# Patient Record
Sex: Female | Born: 1990
Health system: Southern US, Community
[De-identification: ages and names within clinical notes are randomized; demographics above are authoritative.]

## PROBLEM LIST (undated history)

## (undated) DIAGNOSIS — M549 Dorsalgia, unspecified: Secondary | ICD-10-CM

## (undated) DIAGNOSIS — F32A Depression, unspecified: Secondary | ICD-10-CM

## (undated) DIAGNOSIS — E282 Polycystic ovarian syndrome: Secondary | ICD-10-CM

## (undated) DIAGNOSIS — D649 Anemia, unspecified: Secondary | ICD-10-CM

## (undated) DIAGNOSIS — F419 Anxiety disorder, unspecified: Secondary | ICD-10-CM

## (undated) DIAGNOSIS — G8929 Other chronic pain: Secondary | ICD-10-CM

## (undated) HISTORY — DX: Depression, unspecified: F32.A

## (undated) HISTORY — DX: Polycystic ovarian syndrome: E28.2

## (undated) HISTORY — DX: Anxiety disorder, unspecified: F41.9

## (undated) HISTORY — DX: Other chronic pain: G89.29

## (undated) HISTORY — DX: Other chronic pain: M54.9

## (undated) HISTORY — PX: COLPOSCOPY: SHX161

## (undated) HISTORY — PX: WISDOM TOOTH EXTRACTION: SHX21

---

## 2013-03-11 ENCOUNTER — Emergency Department (HOSPITAL_COMMUNITY)
Admission: EM | Admit: 2013-03-11 | Discharge: 2013-03-11 | Disposition: A | Discharge status: Home / Self Care | Payer: 59 | Source: Home / Self Care | Attending: Family Medicine | Admitting: Family Medicine

## 2013-03-11 ENCOUNTER — Encounter (HOSPITAL_COMMUNITY): Payer: Self-pay | Admitting: Emergency Medicine

## 2013-03-11 DIAGNOSIS — A088 Other specified intestinal infections: Secondary | ICD-10-CM

## 2013-03-11 DIAGNOSIS — A084 Viral intestinal infection, unspecified: Secondary | ICD-10-CM

## 2013-03-11 LAB — POCT URINALYSIS DIP (DEVICE)
Glucose, UA: NEGATIVE mg/dL
Hgb urine dipstick: NEGATIVE
Ketones, ur: NEGATIVE mg/dL
Nitrite: NEGATIVE
Protein, ur: NEGATIVE mg/dL
Specific Gravity, Urine: 1.025 (ref 1.005–1.030)
Urobilinogen, UA: 0.2 mg/dL (ref 0.0–1.0)
pH: 6 (ref 5.0–8.0)

## 2013-03-11 LAB — POCT PREGNANCY, URINE: Preg Test, Ur: NEGATIVE

## 2013-03-11 MED ORDER — ONDANSETRON HCL 4 MG PO TABS: 4.0000 mg | ORAL_TABLET | Freq: Four times a day (QID) | ORAL | Status: DC

## 2013-03-11 NOTE — ED Notes (Signed)
C/o dizziness,nausea,fatigue, stomach pain with diarrhea. Pt states she has experienced the same symptoms before and was told several years ago that she was borderline hypoglycemic but never followed up.   Denies any other symptoms.  No otc meds taken for symptoms.

## 2013-03-11 NOTE — ED Provider Notes (Signed)
Nancy Hunter is a 22 y.o. female who presents to Urgent Care today for fatigue and lightheadedness associated with nausea and vomiting. Patient had symptoms starting about 3 or 4 days ago. She had initial vomiting and loose stools. She denies any blood in her stool or vomit. The vomiting and diarrhea have resolved. However she continues to feel tired and fatigued and some dizziness. She denies any vertigo or syncope chest pains or palpitations. She has not tried any medications yet. She is able to eat and drink normally and is urinating normally. She denies any dysuria urinary frequency or urgency. Her last menstrual period was in early October.   She has had similar symptoms in the past. She's been told that she's had low blood sugar and anxiety however she does not have a formal diagnosis.   History reviewed. No pertinent past medical history. History  Substance Use Topics  . Smoking status: Never Smoker   . Smokeless tobacco: Not on file  . Alcohol Use: Yes   ROS as above Medications reviewed. No current facility-administered medications for this encounter.   Current Outpatient Prescriptions  Medication Sig Dispense Refill  . ondansetron (ZOFRAN) 4 MG tablet Take 1 tablet (4 mg total) by mouth every 6 (six) hours.  12 tablet  0    Exam:  BP 130/81  Pulse 89  Temp(Src) 98.3 F (36.8 C) (Oral)  Resp 16  SpO2 100%  LMP 02/13/2013 Filed Vitals:   03/11/13 1444 03/11/13 1445 03/11/13 1446  BP: 126/68 131/71 130/81  Pulse: 76 86 89  Temp: 98.3 F (36.8 C)    TempSrc: Oral    Resp: 16    SpO2: 100%     Gen: Well NAD HEENT: EOMI,  MMM Lungs: CTABL Nl WOB Heart: RRR no MRG Abd: NABS, NT, ND, no CV angle tenderness to percussion. No rebound or guarding. Exts: Non edematous BL  LE, warm and well perfused.   Results for orders placed during the hospital encounter of 03/11/13 (from the past 24 hour(s))  POCT URINALYSIS DIP (DEVICE)     Status: None   Collection Time    03/11/13   2:54 PM      Result Value Range   Glucose, UA NEGATIVE  NEGATIVE mg/dL   Bilirubin Urine NEGATIVE  NEGATIVE   Ketones, ur NEGATIVE  NEGATIVE mg/dL   Specific Gravity, Urine 1.025  1.005 - 1.030   Hgb urine dipstick NEGATIVE  NEGATIVE   pH 6.0  5.0 - 8.0   Protein, ur NEGATIVE  NEGATIVE mg/dL   Urobilinogen, UA 0.2  0.0 - 1.0 mg/dL   Nitrite NEGATIVE  NEGATIVE   Leukocytes, UA NEGATIVE  NEGATIVE  GLUCOSE, CAPILLARY     Status: None   Collection Time    03/11/13  2:58 PM      Result Value Range   Glucose-Capillary 82  70 - 99 mg/dL  POCT PREGNANCY, URINE     Status: None   Collection Time    03/11/13  2:59 PM      Result Value Range   Preg Test, Ur NEGATIVE  NEGATIVE   No results found.  Assessment and Plan: 22 y.o. female with resolving viral gastroenteritis. Patient is not orthostatic and has a benign physical exam and lab findings today. Plan to treat with watchful waiting, and Tylenol as well as a normal diet. I have prescribed Zofran in case she resumes vomiting however think that's unlikely. Plan to followup with her primary care provider soon Discussed warning  signs or symptoms. Please see discharge instructions. Patient expresses understanding.      Rodolph Bong, MD 03/11/13 [phone]

## 2013-11-03 ENCOUNTER — Emergency Department (HOSPITAL_COMMUNITY)
Admission: EM | Admit: 2013-11-03 | Discharge: 2013-11-03 | Disposition: A | Discharge status: Home / Self Care | Payer: 59 | Source: Home / Self Care

## 2013-11-03 ENCOUNTER — Encounter (HOSPITAL_COMMUNITY): Payer: Self-pay | Admitting: Emergency Medicine

## 2013-11-03 DIAGNOSIS — R109 Unspecified abdominal pain: Secondary | ICD-10-CM

## 2013-11-03 DIAGNOSIS — N926 Irregular menstruation, unspecified: Secondary | ICD-10-CM

## 2013-11-03 LAB — POCT URINALYSIS DIP (DEVICE)
BILIRUBIN URINE: NEGATIVE
Glucose, UA: NEGATIVE mg/dL
Hgb urine dipstick: NEGATIVE
KETONES UR: NEGATIVE mg/dL
LEUKOCYTES UA: NEGATIVE
Nitrite: NEGATIVE
Protein, ur: NEGATIVE mg/dL
Specific Gravity, Urine: 1.015 (ref 1.005–1.030)
Urobilinogen, UA: 0.2 mg/dL (ref 0.0–1.0)
pH: 7.5 (ref 5.0–8.0)

## 2013-11-03 NOTE — ED Notes (Signed)
Pt reports abn vaginal spotting onset Monday Sx also include intermittent abd pain, n/v/d Denies fevers Alert w/no signs of acute distress.

## 2013-11-03 NOTE — ED Provider Notes (Signed)
CSN: [id number]     Arrival date & time 11/03/13  1556 History   None    Chief Complaint  Patient presents with  . Vaginal Bleeding   (Consider location/radiation/quality/duration/timing/severity/associated sxs/prior Treatment) HPI  Vaginal bleeding: LMP was May 5th. Typically irregular up to January when put on Provera. No longer on provera (last time was in march along w/ clomed). Period was supposed to start on Sunday but pt only had a few drops. Associated w/ cramps. Increase nausea during this time. Pt scheduled to take provera again per OB 2 days ago. Sexually active and not protecting. Denies fevers, vaginal discharge.   History reviewed. No pertinent past medical history. History reviewed. No pertinent past surgical history. No family history on file. History  Substance Use Topics  . Smoking status: Never Smoker   . Smokeless tobacco: Not on file  . Alcohol Use: Yes   OB History   Grav Para Term Preterm Abortions TAB SAB Ect Mult Living                 Review of Systems Per HPI w./ all other systems negative Allergies  Review of patient's allergies indicates no known allergies.  Home Medications   Prior to Admission medications   Medication Sig Start Date End Date Taking? Authorizing Laakea Pereira  ondansetron (ZOFRAN) 4 MG tablet Take 1 tablet (4 mg total) by mouth every 6 (six) hours. 03/11/13   Rodolph BongEvan S Corey, MD   BP 125/53  Pulse 67  Temp(Src) 97.8 F (36.6 C) (Oral)  Resp 16  SpO2 100%  LMP 09/30/2013 Physical Exam  Constitutional: She is oriented to person, place, and time. She appears well-developed. No distress.  HENT:  Head: Normocephalic and atraumatic.  Eyes: EOM are normal. Pupils are equal, round, and reactive to light.  Neck: Normal range of motion. Neck supple.  Cardiovascular: Normal rate, normal heart sounds and intact distal pulses.   No murmur heard. Pulmonary/Chest: Effort normal and breath sounds normal.  Abdominal: Soft. Bowel sounds are  normal. She exhibits no distension and no mass. There is no tenderness. There is no rebound and no guarding.  Musculoskeletal: Normal range of motion. She exhibits no edema and no tenderness.  Neurological: She is alert and oriented to person, place, and time. She exhibits normal muscle tone.  Skin: Skin is warm and dry. No rash noted. She is not diaphoretic. No erythema. No pallor.  Psychiatric: She has a normal mood and affect. Her behavior is normal. Judgment and thought content normal.    ED Course  Procedures (including critical care time) Labs Review Labs Reviewed - No data to display  Imaging Review No results found.   MDM   1. Abdominal discomfort   2. Menstrual irregularity    Pt actively trying to get pregnant and is apparently undergoing a detailed workkup at her OB office. Urine Preg is neg today. Pt may still be neg but will f/u w/ OB office for possible bHCG Quant level. Pt to not take provera until repeats Urine preg in 2 wks if desired. No signs of vaginal or urinary tract infection  ibuprofen PRN releif.  Shelly Flattenavid Merrell, MD Family Medicine PGY-3 11/03/2013, 6:19 PM     Ozella Rocksavid J Merrell, MD 11/03/13 [phone]

## 2013-11-03 NOTE — Discharge Instructions (Signed)
You are not pregnant today, or at least your lab work does not show you are pregnant.  Please give yourself another 2 weeks and retake a pregnancy test Please call your OB office if you want to get blood work to look for possible pregnancy There doesn't seem to be anything else wrong with your body at this time other than a possible mild viral gut infection Wait to restart your provera until you take another pregnancy test in 2 weeks

## 2013-11-07 NOTE — ED Provider Notes (Signed)
Medical screening examination/treatment/procedure(s) were performed by a resident physician and as supervising physician I was immediately available for consultation/collaboration.  Leslee Homeavid Keller, M.D.   Reuben Likesavid C Keller, MD 11/07/13 1155

## 2013-11-08 ENCOUNTER — Ambulatory Visit (INDEPENDENT_AMBULATORY_CARE_PROVIDER_SITE_OTHER): Payer: 59 | Admitting: Physician Assistant

## 2013-11-08 VITALS — BP 114/60 | HR 77 | Temp 98.0°F | Resp 16 | Ht 64.0 in | Wt 119.0 lb

## 2013-11-08 DIAGNOSIS — J302 Other seasonal allergic rhinitis: Secondary | ICD-10-CM

## 2013-11-08 DIAGNOSIS — J3089 Other allergic rhinitis: Secondary | ICD-10-CM

## 2013-11-08 DIAGNOSIS — R05 Cough: Secondary | ICD-10-CM

## 2013-11-08 DIAGNOSIS — R059 Cough, unspecified: Secondary | ICD-10-CM

## 2013-11-08 NOTE — Patient Instructions (Signed)
Recommend Zyrtec (cetirizine) daily in the morning. May also take Benadryl 25-50 mg at bedtime

## 2013-11-08 NOTE — Progress Notes (Signed)
   Subjective:    Patient ID: Nancy Hunter, female    DOB: 1991/02/06, 23 y.o.   MRN: [id number]  HPI 23 year old female presents for return to work clearance.  States she has had a cough for 1 week that is now improving. Was [facility] on 6/18 due to viral gastroenteritis as well as her "summer cold."  Admits that both have improved. She had a fever on 6/19 and 6/20 that has now resolved. Admits she still has a dry, hacking cough along with sneezing and itchy watery eyes. Does have a hx of allergies.  Takes Benadryl prn which does seem to help.  Works at [employer].  Not currently on any OTC medications for this.     Review of Systems  Constitutional: Negative for fever and chills.  HENT: Positive for rhinorrhea and sneezing. Negative for congestion, ear pain, sinus pressure and sore throat.   Respiratory: Positive for cough. Negative for chest tightness, shortness of breath and wheezing.   Gastrointestinal: Negative for nausea and vomiting.  Neurological: Negative for dizziness and headaches.       Objective:   Physical Exam  Constitutional: She is oriented to person, place, and time. She appears well-developed and well-nourished.  HENT:  Head: Normocephalic and atraumatic.  Right Ear: Hearing, tympanic membrane, external ear and ear canal normal.  Left Ear: Hearing, tympanic membrane, external ear and ear canal normal.  Mouth/Throat: Uvula is midline, oropharynx is clear and moist and mucous membranes are normal.  Eyes: Conjunctivae are normal.  Neck: Normal range of motion.  Cardiovascular: Normal rate, regular rhythm and normal heart sounds.   Pulmonary/Chest: Effort normal and breath sounds normal.  Neurological: She is alert and oriented to person, place, and time.  Psychiatric: She has a normal mood and affect. Her behavior is normal. Judgment and thought content normal.          Assessment & Plan:  Cough  Other seasonal allergic rhinitis  Cleared  to return to work.  GI illness resolved. Cough is improving - likely secondary to allergic rhinitis and PND Start Zyrtec daily in the a.m. Ok to continue Benadryl 25-50 mg at bedtime RTC precautions discussed. F/u if symptoms worsening or fail to improve.

## 2013-11-18 ENCOUNTER — Ambulatory Visit (INDEPENDENT_AMBULATORY_CARE_PROVIDER_SITE_OTHER): Payer: 59 | Admitting: Emergency Medicine

## 2013-11-18 VITALS — BP 126/76 | HR 106 | Temp 98.4°F | Resp 18 | Ht 64.5 in | Wt 120.0 lb

## 2013-11-18 DIAGNOSIS — A088 Other specified intestinal infections: Secondary | ICD-10-CM

## 2013-11-18 MED ORDER — LOPERAMIDE HCL 2 MG PO TABS: ORAL_TABLET | ORAL | Status: DC

## 2013-11-18 MED ORDER — ONDANSETRON 4 MG PO TBDP
8.0000 mg | ORAL_TABLET | Freq: Once | ORAL | Status: AC
  Administered 2013-11-18: 8 mg via ORAL

## 2013-11-18 MED ORDER — ONDANSETRON 8 MG PO TBDP: 8.0000 mg | ORAL_TABLET | Freq: Three times a day (TID) | ORAL | Status: DC | PRN

## 2013-11-18 NOTE — Progress Notes (Signed)
[facility] [address], [place] [address] [phone]  Date:  11/18/2013   Name:  Nancy Hunter   DOB:  10-11-1990   MRN:  [id number]  PCP:  No PCP Per Patient    Chief Complaint: Nausea, Emesis and Diarrhea   History of Present Illness:  Nancy FlorSymone Spilker is a 23 y.o. very pleasant female patient who presents with the following:  Ill yesterday and today with frequent watery stools.  The patient has no complaint of blood, mucous, or pus in her stools. Worse today.  Vomiting.  Poor appetite.  No travel or sketchy water.  No fever or chills.  No sick contacts.  Some abdominal cramping.  No improvement with over the counter medications or other home remedies. Denies other complaint or health concern today.    There are no active problems to display for this patient.   No past medical history on file.  No past surgical history on file.  History  Substance Use Topics  . Smoking status: Never Smoker   . Smokeless tobacco: Not on file  . Alcohol Use: Yes    No family history on file.  No Known Allergies  Medication list has been reviewed and updated.  No current outpatient prescriptions on file prior to visit.   No current facility-administered medications on file prior to visit.    Review of Systems:  As per HPI, otherwise negative.    Physical Examination: Filed Vitals:   11/18/13 1751  BP: 126/76  Pulse: 106  Temp: 98.4 F (36.9 C)  Resp: 18   Filed Vitals:   11/18/13 1751  Height: 5' 4.5" (1.638 m)  Weight: 120 lb (54.432 kg)   Body mass index is 20.29 kg/(m^2). Ideal Body Weight: Weight in (lb) to have BMI = 25: 147.6  GEN: WDWN, NAD, Non-toxic, A & O x 3  Dry.  Not icteric HEENT: Atraumatic, Normocephalic. Neck supple. No masses, No LAD. Ears and Nose: No external deformity. CV: RRR, No M/G/R. No JVD. No thrill. No extra heart sounds. PULM: CTA B, no wheezes, crackles, rhonchi. No retractions. No resp. distress. No accessory  muscle use. ABD: S, NT, ND,  Hyperactive BS. No rebound. No HSM. EXTR: No c/c/e NEURO Normal gait.  PSYCH: Normally interactive. Conversant. Not depressed or anxious appearing.  Calm demeanor.    Assessment and Plan: Gastroenteritis Imodium zofran Clears   Signed,  Phillips OdorJeffery Delrico Minehart, MD

## 2013-11-18 NOTE — Patient Instructions (Signed)
Clear Liquid Diet A clear liquid diet is a short-term diet that is prescribed to provide the necessary fluid and basic energy you need when you can have nothing else. The clear liquid diet consists of liquids or solids that will become liquid at room temperature. You should be able to see through the liquid. There are many reasons that you may be restricted to clear liquids, such as:  When you have a sudden-onset (acute) condition that occurs before or after surgery.  To help your body slowly get adjusted to food again after a long period when you were unable to have food.  Replacement of fluids when you have a diarrheal disease.  When you are going to have certain exams, such as a colonoscopy, in which instruments are inserted inside your body to look at parts of your digestive system. WHAT CAN I HAVE? A clear liquid diet does not provide all the nutrients you need. It is important to choose a variety of the following items to get as many nutrients as possible:  Vegetable juices that do not have pulp.  Fruit juices and fruit drinks that do not have pulp.  Coffee (regular or decaffeinated), tea, or soda at the discretion of your health care provider.  Clear bouillon, broth, or strained broth-based soups.  High-protein and flavored gelatins.  Sugar or honey.  Ices or frozen ice pops that do not contain milk. If you are not sure whether you can have certain items, you should ask your health care provider. You may also ask your health care provider if there are any other clear liquid options. Document Released: 05/05/2005 Document Revised: 05/10/2013 Document Reviewed: 04/01/2013 ExitCare Patient Information 2015 [employer]. This information is not intended to replace advice given to you by your health care provider. Make sure you discuss any questions you have with your health care provider. Viral Gastroenteritis Viral gastroenteritis is also known as stomach flu. This condition  affects the stomach and intestinal tract. It can cause sudden diarrhea and vomiting. The illness typically lasts 3 to 8 days. Most people develop an immune response that eventually gets rid of the virus. While this natural response develops, the virus can make you quite ill. CAUSES  Many different viruses can cause gastroenteritis, such as rotavirus or noroviruses. You can catch one of these viruses by consuming contaminated food or water. You may also catch a virus by sharing utensils or other personal items with an infected person or by touching a contaminated surface. SYMPTOMS  The most common symptoms are diarrhea and vomiting. These problems can cause a severe loss of body fluids (dehydration) and a body salt (electrolyte) imbalance. Other symptoms may include:  Fever.  Headache.  Fatigue.  Abdominal pain. DIAGNOSIS  Your caregiver can usually diagnose viral gastroenteritis based on your symptoms and a physical exam. A stool sample may also be taken to test for the presence of viruses or other infections. TREATMENT  This illness typically goes away on its own. Treatments are aimed at rehydration. The most serious cases of viral gastroenteritis involve vomiting so severely that you are not able to keep fluids down. In these cases, fluids must be given through an intravenous line (IV). HOME CARE INSTRUCTIONS   Drink enough fluids to keep your urine clear or pale yellow. Drink small amounts of fluids frequently and increase the amounts as tolerated.  Ask your caregiver for specific rehydration instructions.  Avoid:  Foods high in sugar.  Alcohol.  Carbonated drinks.  Tobacco.  Juice.    Caffeine drinks.  Extremely hot or cold fluids.  Fatty, greasy foods.  Too much intake of anything at one time.  Dairy products until 24 to 48 hours after diarrhea stops.  You may consume probiotics. Probiotics are active cultures of beneficial bacteria. They may lessen the amount and  number of diarrheal stools in adults. Probiotics can be found in yogurt with active cultures and in supplements.  Wash your hands well to avoid spreading the virus.  Only take over-the-counter or prescription medicines for pain, discomfort, or fever as directed by your caregiver. Do not give aspirin to children. Antidiarrheal medicines are not recommended.  Ask your caregiver if you should continue to take your regular prescribed and over-the-counter medicines.  Keep all follow-up appointments as directed by your caregiver. SEEK IMMEDIATE MEDICAL CARE IF:   You are unable to keep fluids down.  You do not urinate at least once every 6 to 8 hours.  You develop shortness of breath.  You notice blood in your stool or vomit. This may look like coffee grounds.  You have abdominal pain that increases or is concentrated in one small area (localized).  You have persistent vomiting or diarrhea.  You have a fever.  The patient is a child younger than 3 months, and he or she has a fever.  The patient is a child older than 3 months, and he or she has a fever and persistent symptoms.  The patient is a child older than 3 months, and he or she has a fever and symptoms suddenly get worse.  The patient is a baby, and he or she has no tears when crying. MAKE SURE YOU:   Understand these instructions.  Will watch your condition.  Will get help right away if you are not doing well or get worse. Document Released: 05/05/2005 Document Revised: 07/28/2011 Document Reviewed: 02/19/2011 ExitCare Patient Information 2015 [employer]. This information is not intended to replace advice given to you by your health care provider. Make sure you discuss any questions you have with your health care provider.  

## 2014-01-23 ENCOUNTER — Encounter (HOSPITAL_COMMUNITY): Payer: Self-pay | Admitting: Emergency Medicine

## 2014-01-23 ENCOUNTER — Emergency Department (HOSPITAL_COMMUNITY)
Admission: EM | Admit: 2014-01-23 | Discharge: 2014-01-23 | Disposition: A | Discharge status: Home / Self Care | Payer: 59 | Attending: Emergency Medicine | Admitting: Emergency Medicine

## 2014-01-23 DIAGNOSIS — W010XXA Fall on same level from slipping, tripping and stumbling without subsequent striking against object, initial encounter: Secondary | ICD-10-CM | POA: Diagnosis not present

## 2014-01-23 DIAGNOSIS — S0180XA Unspecified open wound of other part of head, initial encounter: Secondary | ICD-10-CM | POA: Diagnosis present

## 2014-01-23 DIAGNOSIS — S0181XA Laceration without foreign body of other part of head, initial encounter: Secondary | ICD-10-CM

## 2014-01-23 DIAGNOSIS — S01409A Unspecified open wound of unspecified cheek and temporomandibular area, initial encounter: Secondary | ICD-10-CM | POA: Diagnosis not present

## 2014-01-23 DIAGNOSIS — W1809XA Striking against other object with subsequent fall, initial encounter: Secondary | ICD-10-CM | POA: Insufficient documentation

## 2014-01-23 DIAGNOSIS — Y9389 Activity, other specified: Secondary | ICD-10-CM | POA: Insufficient documentation

## 2014-01-23 DIAGNOSIS — Z23 Encounter for immunization: Secondary | ICD-10-CM | POA: Diagnosis not present

## 2014-01-23 DIAGNOSIS — Y9289 Other specified places as the place of occurrence of the external cause: Secondary | ICD-10-CM | POA: Insufficient documentation

## 2014-01-23 LAB — I-STAT CHEM 8, ED
BUN: 12 mg/dL (ref 6–23)
CHLORIDE: 106 meq/L (ref 96–112)
Calcium, Ion: 1.15 mmol/L (ref 1.12–1.23)
Creatinine, Ser: 0.9 mg/dL (ref 0.50–1.10)
Glucose, Bld: 92 mg/dL (ref 70–99)
HCT: 46 % (ref 36.0–46.0)
Hemoglobin: 15.6 g/dL — ABNORMAL HIGH (ref 12.0–15.0)
Potassium: 4 mEq/L (ref 3.7–5.3)
Sodium: 140 mEq/L (ref 137–147)
TCO2: 26 mmol/L (ref 0–100)

## 2014-01-23 MED ORDER — OXYCODONE-ACETAMINOPHEN 5-325 MG PO TABS: 1.0000 | ORAL_TABLET | Freq: Four times a day (QID) | ORAL | Status: DC | PRN

## 2014-01-23 MED ORDER — LIDOCAINE-EPINEPHRINE 1 %-1:100000 IJ SOLN
10.0000 mL | Freq: Once | INTRAMUSCULAR | Status: AC
  Administered 2014-01-23: 10 mL
  Filled 2014-01-23: qty 1

## 2014-01-23 MED ORDER — OXYCODONE-ACETAMINOPHEN 5-325 MG PO TABS
1.0000 | ORAL_TABLET | Freq: Once | ORAL | Status: AC
  Administered 2014-01-23: 1 via ORAL
  Filled 2014-01-23: qty 1

## 2014-01-23 MED ORDER — NAPROXEN 500 MG PO TABS: 500.0000 mg | ORAL_TABLET | Freq: Two times a day (BID) | ORAL | Status: DC | PRN

## 2014-01-23 MED ORDER — TETANUS-DIPHTH-ACELL PERTUSSIS 5-2.5-18.5 LF-MCG/0.5 IM SUSP
0.5000 mL | Freq: Once | INTRAMUSCULAR | Status: AC
  Administered 2014-01-23: 0.5 mL via INTRAMUSCULAR
  Filled 2014-01-23: qty 0.5

## 2014-01-23 NOTE — ED Notes (Signed)
Per pt sts that she slipped and fell in the bathroom. sts hit face on the edge of tub. Pt has small lac to left cheek . Bleeding controlled.

## 2014-01-23 NOTE — ED Provider Notes (Signed)
CSN: [id number]     Arrival date & time 01/23/14  1344 History   First MD Initiated Contact with Patient 01/23/14 1602     This chart was scribed for non-physician practitioner, Allen Derry PA-C working with Gilda Crease, * by Arlan Organ, ED Scribe. This patient was seen in room TR08C/TR08C and the patient's care was started at 4:09 PM.   Chief Complaint  Patient presents with  . Laceration   Patient is a 23 y.o. female presenting with skin laceration. The history is provided by the patient. No language interpreter was used.  Laceration Location:  Face Facial laceration location:  L cheek Quality: straight   Bleeding: controlled   Time since incident:  5 hours Laceration mechanism:  Fall Pain details:    Severity:  Mild   Timing:  Constant   Progression:  Improving Foreign body present:  No foreign bodies Tetanus status:  Unknown   HPI Comments: Nancy Hunter is a 23 y.o. female who presents to the Emergency Department complaining of laceration to the L cheek sustained at 10 AM this morning. Pt states she slipped and fell in the bathroom hitting her cheek on the ledge of the bath tub. No LOC but admits to mild dizziness and lightheadedness after falling. Pt states wound was bleeding significantly initially, however, bleeding is now controlled in ED. Currently rates pain 5/10 and describes as achy/throbbing. No aggravation factors at this time. Ms. Dungan was given Oxycodone in triage with improvement for symptoms. She denies any blurred vision, drooling, trouble swallowing, loose teeth, chest pain, SOB, nausea, vomiting, tinnitus, or visual disturbances. She denies currently being on any blood thinners. Pt is unaware of Tetanus status. She has no pertinent past medical history. No other concerns this visit.   History reviewed. No pertinent past medical history. History reviewed. No pertinent past surgical history. History reviewed. No pertinent family  history. History  Substance Use Topics  . Smoking status: Never Smoker   . Smokeless tobacco: Not on file  . Alcohol Use: Yes   OB History   Grav Para Term Preterm Abortions TAB SAB Ect Mult Living                 Review of Systems  Constitutional: Negative for fever and chills.  HENT: Negative for tinnitus and trouble swallowing.   Eyes: Negative for visual disturbance.  Respiratory: Negative for shortness of breath.   Gastrointestinal: Negative for nausea, vomiting and abdominal pain.  Skin: Positive for wound.  A complete 10 system review of systems was obtained and all systems are negative except as noted in the HPI and PMH.     Allergies  Review of patient's allergies indicates no known allergies.  Home Medications   Prior to Admission medications   Medication Sig Start Date End Date Taking? Authorizing Provider  loperamide (IMODIUM A-D) 2 MG tablet 2 now and one hourly prn diarrhea.  Max 8 tabs in 24 hours 11/18/13   Carmelina Dane, MD  ondansetron (ZOFRAN-ODT) 8 MG disintegrating tablet Take 1 tablet (8 mg total) by mouth every 8 (eight) hours as needed for nausea. 11/18/13   Carmelina Dane, MD   Triage Vitals: BP 144/78  Pulse 72  Temp(Src) 98.2 F (36.8 C) (Oral)  Resp 16  Ht  (1.626 m)  Wt 125 lb (56.7 kg)  BMI 21.45 kg/m2  SpO2 100%   Physical Exam  Nursing note and vitals reviewed. Constitutional: She is oriented to person, place, and time.  Vital signs are normal. She appears well-developed and well-nourished. No distress.  HENT:  Head: Normocephalic. Head is with laceration.    Nose: Nose normal.  Mouth/Throat: Uvula is midline, oropharynx is clear and moist and mucous membranes are normal. Normal dentition.  Small 1cm straight superficial lac to L cheek. No jaw crepitus or TTP, no swelling, dentitia intact  Eyes: Conjunctivae and EOM are normal. Pupils are equal, round, and reactive to light.  Neck: Normal range of motion. Neck supple. No  spinous process tenderness and no muscular tenderness present. No rigidity. Normal range of motion present.  FROM intact without spinous process or paraspinous muscle TTP, no bony stepoffs or deformities, no muscle spasms. No rigidity or meningeal signs. No bruising or swelling.  Cardiovascular: Normal rate and intact distal pulses.   Pulmonary/Chest: Effort normal. No respiratory distress.  Abdominal: Normal appearance. She exhibits no distension.  Musculoskeletal: Normal range of motion.  MAE x4  Neurological: She is alert and oriented to person, place, and time. She has normal strength. No sensory deficit.  Skin: Skin is warm and dry. Laceration noted.  Lac as described above, no active bleeding  Psychiatric: She has a normal mood and affect.    ED Course  LACERATION REPAIR Date/Time: 01/23/2014 5:35 PM Performed by: Marjean Donna, Vinie Charity STRUPP Authorized by: Ramond Marrow Consent: Verbal consent obtained. Risks and benefits: risks, benefits and alternatives were discussed Consent given by: patient Patient understanding: patient states understanding of the procedure being performed Patient consent: the patient's understanding of the procedure matches consent given Patient identity confirmed: verbally with patient Body area: head/neck Location details: left cheek Laceration length: 1 cm Foreign bodies: no foreign bodies Tendon involvement: none Nerve involvement: none Vascular damage: no Anesthesia: local infiltration Local anesthetic: lidocaine 1% with epinephrine Anesthetic total: 0.5 ml Patient sedated: no Preparation: Patient was prepped and draped in the usual sterile fashion. Irrigation solution: saline Irrigation method: syringe Amount of cleaning: standard Debridement: none Degree of undermining: none Skin closure: glue Approximation: close Approximation difficulty: simple Dressing: band aid. Patient tolerance: Patient tolerated the procedure  well with no immediate complications.   (including critical care time)  DIAGNOSTIC STUDIES: Oxygen Saturation is 100% on RA, Normal by my interpretation.    COORDINATION OF CARE: 4:08 PM- Pt was offered X-Ray and declined. Will use Dermabond to repair wound. Advised pt to keep area covered for first few days and apply sunscreen to scar for the next year. Discussed treatment plan with pt at bedside and pt agreed to plan.      Labs Review Labs Reviewed - No data to display  Imaging Review No results found.   EKG Interpretation None      MDM   Final diagnoses:  Facial laceration, initial encounter    23y/o female with facial lac after falling in the bathtub. Tetanus shot was updated here. Percocet was given with relief of symptoms. Patient declined wanting x-rays, and I do not believe she has any maxillofacial fractures. G-tube amount of blood described, and lightheadedness, obtained a stat chem 8 with no anemia noted. Dermabond was placed with good hemostasis of the wound. Discussed care of Dermabond and scar for the next year. Given Naprosyn and Percocet for pain. Discussed following up at urgent care in 10 days. I explained the diagnosis and have given explicit precautions to return to the ER including for any other new or worsening symptoms. The patient understands and accepts the medical plan as it's been dictated and I have answered  their questions. Discharge instructions concerning home care and prescriptions have been given. The patient is STABLE and is discharged to home in good condition.   I personally performed the services described in this documentation, which was scribed in my presence. The recorded information has been reviewed and is accurate.   BP 144/78  Pulse 72  Temp(Src) 98.2 F (36.8 C) (Oral)  Resp 16  Ht  (1.626 m)  Wt 125 lb (56.7 kg)  BMI 21.45 kg/m2  SpO2 100%  Meds ordered this encounter  Medications  . oxyCODONE-acetaminophen  (PERCOCET/ROXICET) 5-325 MG per tablet 1 tablet    Sig:   . Tdap (BOOSTRIX) injection 0.5 mL    Sig:   . lidocaine-EPINEPHrine (XYLOCAINE W/EPI) 1 %-1:100000 (with pres) injection 10 mL    Sig:   . oxyCODONE-acetaminophen (PERCOCET) 5-325 MG per tablet    Sig: Take 1-2 tablets by mouth every 6 (six) hours as needed for severe pain.    Dispense:  6 tablet    Refill:  0    Order Specific Question:  Supervising Provider    Answer:  Eber Hong D [[id number]]  . naproxen (NAPROSYN) 500 MG tablet    Sig: Take 1 tablet (500 mg total) by mouth 2 (two) times daily as needed for mild pain, moderate pain or headache (TAKE WITH MEALS.).    Dispense:  20 tablet    Refill:  0    Order Specific Question:  Supervising Provider    Answer:  Eber Hong D [[id number]]     Paizleigh Wilds Strupp Camprubi-Soms, PA-C 01/23/14 1800
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
## 2014-01-23 NOTE — Discharge Instructions (Signed)
Keep wound and clean with mild soap and water. Keep area covered with a topical bandage, keep bandage dry, and do not submerge in water for 24 hours. Use ice for at a time every hour for additional pain relief and swelling. Alternate between naprosyn and percocet for additional pain relief. Follow up with your primary care doctor or the [facility] in approximately 7 days for wound recheck. Monitor area for signs of infection to include, but not limited to: increasing pain, redness, drainage/pus, or swelling. Return to emergency department for emergent changing or worsening symptoms. REMEMBER TO USE SUNSCREEN ON YOUR FACE FOR THE NEXT YEAR, BUT DON'T PUT ANYTHING ON THIS AREA FOR THE NEXT 10 DAYS.  Facial Laceration A facial laceration is a cut on the face. These injuries can be painful and cause bleeding. Some cuts may need to be closed with stitches (sutures), skin adhesive strips, or wound glue. Cuts usually heal quickly but can leave a scar. It can take 1-2 years for the scar to go away completely. HOME CARE   Only take medicines as told by your doctor.  Follow your doctor's instructions for wound care. For Stitches:  Keep the cut clean and dry.  If you have a bandage (dressing), change it at least once a day. Change the bandage if it gets wet or dirty, or as told by your doctor.  Wash the cut with soap and water 2 times a day. Rinse the cut with water. Pat it dry with a clean towel.  Put a thin layer of medicated cream on the cut as told by your doctor.  You may shower after the first 24 hours. Do not soak the cut in water until the stitches are removed.  Have your stitches removed as told by your doctor.  Do not wear any makeup until a few days after your stitches are removed. For Skin Adhesive Strips:  Keep the cut clean and dry.  Do not get the strips wet. You may take a bath, but be careful to keep the cut dry.  If the cut gets wet, pat it dry with a  clean towel.  The strips will fall off on their own. Do not remove the strips that are still stuck to the cut. For Wound Glue:  You may shower or take baths. Do not soak or scrub the cut. Do not swim. Avoid heavy sweating until the glue falls off on its own. After a shower or bath, pat the cut dry with a clean towel.  Do not put medicine or makeup on your cut until the glue falls off.  If you have a bandage, do not put tape over the glue.  Avoid lots of sunlight or tanning lamps until the glue falls off.  The glue will fall off on its own in 5-10 days. Do not pick at the glue. After Healing: Put sunscreen on the cut for the first year to reduce your scar. GET HELP RIGHT AWAY IF:   Your cut area gets red, painful, or puffy (swollen).  You see a yellowish-white fluid (pus) coming from the cut.  You have chills or a fever. MAKE SURE YOU:   Understand these instructions.  Will watch your condition.  Will get help right away if you are not doing well or get worse. Document Released: 10/22/2007 Document Revised: 02/23/2013 Document Reviewed: 12/16/2012 [facility] Patient Information 2015 [place], [place]. This information is not intended to replace advice given to you by your health  care provider. Make sure you discuss any questions you have with your health care provider.  Stitches, Staples, or Skin Adhesive Strips  Stitches (sutures), staples, and skin adhesive strips hold the skin together as it heals. They will usually be in place for 7 days or less. HOME CARE  Wash your hands with soap and water before and after you touch your wound.  Only take medicine as told by your doctor.  Cover your wound only if your doctor told you to. Otherwise, leave it open to air.  Do not get your stitches wet or dirty. If they get dirty, dab them gently with a clean washcloth. Wet the washcloth with soapy water. Do not rub. Pat them dry gently.  Do not put medicine or medicated cream on your  stitches unless your doctor told you to.  Do not take out your own stitches or staples. Skin adhesive strips will fall off by themselves.  Do not pick at the wound. Picking can cause an infection.  Do not miss your follow-up appointment.  If you have problems or questions, call your doctor. GET HELP RIGHT AWAY IF:   You have a temperature by mouth above 102 F (38.9 C), not controlled by medicine.  You have chills.  You have redness or pain around your stitches.  There is puffiness (swelling) around your stitches.  You notice fluid (drainage) from your stitches.  There is a bad smell coming from your wound. MAKE SURE YOU:  Understand these instructions.  Will watch your condition.  Will get help if you are not doing well or get worse. Document Released: 03/02/2009 Document Revised: 07/28/2011 Document Reviewed: 03/02/2009 [facility] Patient Information 2015 [place], [place]. This information is not intended to replace advice given to you by your health care provider. Make sure you discuss any questions you have with your health care provider.

## 2014-01-24 NOTE — ED Provider Notes (Signed)
Medical screening examination/treatment/procedure(s) were performed by non-physician practitioner and as supervising physician I was immediately available for consultation/collaboration.   EKG Interpretation None       Gilda Crease, MD 01/24/14 [id number]

## 2014-01-26 ENCOUNTER — Ambulatory Visit (INDEPENDENT_AMBULATORY_CARE_PROVIDER_SITE_OTHER): Payer: 59 | Admitting: Emergency Medicine

## 2014-01-26 ENCOUNTER — Ambulatory Visit
Admission: RE | Admit: 2014-01-26 | Discharge: 2014-01-26 | Disposition: A | Discharge status: Home / Self Care | Payer: 59 | Source: Ambulatory Visit | Attending: Emergency Medicine | Admitting: Emergency Medicine

## 2014-01-26 VITALS — BP 116/72 | HR 75 | Temp 97.7°F | Resp 18 | Ht 64.5 in | Wt 125.0 lb

## 2014-01-26 DIAGNOSIS — S0993XA Unspecified injury of face, initial encounter: Secondary | ICD-10-CM

## 2014-01-26 DIAGNOSIS — S199XXA Unspecified injury of neck, initial encounter: Secondary | ICD-10-CM
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
## 2014-01-31 ENCOUNTER — Ambulatory Visit (INDEPENDENT_AMBULATORY_CARE_PROVIDER_SITE_OTHER): Payer: 59 | Admitting: Family Medicine

## 2014-01-31 VITALS — BP 116/74 | HR 75 | Temp 97.8°F | Resp 16 | Ht 64.5 in | Wt 124.4 lb

## 2014-01-31 DIAGNOSIS — M2669 Other specified disorders of temporomandibular joint: Secondary | ICD-10-CM

## 2014-01-31 DIAGNOSIS — H9319 Tinnitus, unspecified ear: Secondary | ICD-10-CM

## 2014-01-31 DIAGNOSIS — H9313 Tinnitus, bilateral: Secondary | ICD-10-CM

## 2014-01-31 DIAGNOSIS — S0300XD Dislocation of jaw, unspecified side, subsequent encounter: Secondary | ICD-10-CM

## 2014-01-31 DIAGNOSIS — S0180XA Unspecified open wound of other part of head, initial encounter: Secondary | ICD-10-CM

## 2014-01-31 DIAGNOSIS — Z5189 Encounter for other specified aftercare: Secondary | ICD-10-CM

## 2014-01-31 DIAGNOSIS — S0181XD Laceration without foreign body of other part of head, subsequent encounter: Secondary | ICD-10-CM

## 2014-01-31 DIAGNOSIS — M26629 Arthralgia of temporomandibular joint, unspecified side: Secondary | ICD-10-CM

## 2014-01-31 MED ORDER — MELOXICAM 7.5 MG PO TABS: 7.5000 mg | ORAL_TABLET | Freq: Every day | ORAL | Status: DC
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
## 2014-02-02 ENCOUNTER — Encounter: Payer: Self-pay | Admitting: Radiology

## 2014-03-20 ENCOUNTER — Emergency Department (HOSPITAL_COMMUNITY): Payer: 59

## 2014-03-20 ENCOUNTER — Emergency Department (HOSPITAL_COMMUNITY)
Admission: EM | Admit: 2014-03-20 | Discharge: 2014-03-20 | Disposition: A | Discharge status: Home / Self Care | Payer: 59 | Attending: Emergency Medicine | Admitting: Emergency Medicine

## 2014-03-20 ENCOUNTER — Encounter (HOSPITAL_COMMUNITY): Payer: Self-pay | Admitting: *Deleted

## 2014-03-20 DIAGNOSIS — S79911A Unspecified injury of right hip, initial encounter: Secondary | ICD-10-CM | POA: Diagnosis not present

## 2014-03-20 DIAGNOSIS — Y92410 Unspecified street and highway as the place of occurrence of the external cause: Secondary | ICD-10-CM | POA: Insufficient documentation

## 2014-03-20 DIAGNOSIS — Y9389 Activity, other specified: Secondary | ICD-10-CM | POA: Insufficient documentation

## 2014-03-20 DIAGNOSIS — S299XXA Unspecified injury of thorax, initial encounter: Secondary | ICD-10-CM | POA: Insufficient documentation

## 2014-03-20 DIAGNOSIS — Z791 Long term (current) use of non-steroidal anti-inflammatories (NSAID): Secondary | ICD-10-CM | POA: Insufficient documentation

## 2014-03-20 DIAGNOSIS — S29012A Strain of muscle and tendon of back wall of thorax, initial encounter: Secondary | ICD-10-CM | POA: Diagnosis not present

## 2014-03-20 DIAGNOSIS — S79912A Unspecified injury of left hip, initial encounter: Secondary | ICD-10-CM | POA: Insufficient documentation

## 2014-03-20 DIAGNOSIS — S199XXA Unspecified injury of neck, initial encounter: Secondary | ICD-10-CM | POA: Diagnosis not present

## 2014-03-20 DIAGNOSIS — S39012A Strain of muscle, fascia and tendon of lower back, initial encounter: Secondary | ICD-10-CM | POA: Insufficient documentation

## 2014-03-20 DIAGNOSIS — S3992XA Unspecified injury of lower back, initial encounter: Secondary | ICD-10-CM | POA: Diagnosis present

## 2014-03-20 LAB — POC URINE PREG, ED: PREG TEST UR: NEGATIVE

## 2014-03-20 MED ORDER — IBUPROFEN 800 MG PO TABS: 800.0000 mg | ORAL_TABLET | Freq: Three times a day (TID) | ORAL | Status: DC

## 2014-03-20 MED ORDER — IBUPROFEN 800 MG PO TABS
800.0000 mg | ORAL_TABLET | Freq: Once | ORAL | Status: AC
  Administered 2014-03-20: 800 mg via ORAL
  Filled 2014-03-20: qty 1

## 2014-03-20 MED ORDER — HYDROCODONE-ACETAMINOPHEN 5-325 MG PO TABS: 2.0000 | ORAL_TABLET | ORAL | Status: DC | PRN

## 2014-03-20 NOTE — ED Notes (Addendum)
Pt not in room.

## 2014-03-20 NOTE — ED Notes (Signed)
Patient transported to X-ray 
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
## 2014-03-22 ENCOUNTER — Other Ambulatory Visit: Payer: Self-pay | Admitting: Obstetrics and Gynecology

## 2014-03-22 ENCOUNTER — Other Ambulatory Visit (HOSPITAL_COMMUNITY)
Admission: RE | Admit: 2014-03-22 | Discharge: 2014-03-22 | Disposition: A | Discharge status: Home / Self Care | Payer: 59 | Source: Ambulatory Visit | Attending: Obstetrics and Gynecology | Admitting: Obstetrics and Gynecology

## 2014-03-22 DIAGNOSIS — Z01419 Encounter for gynecological examination (general) (routine) without abnormal findings: Secondary | ICD-10-CM | POA: Diagnosis present

## 2014-03-23 LAB — CYTOLOGY - PAP

## 2014-03-27 ENCOUNTER — Encounter: Payer: Self-pay | Admitting: Family

## 2014-03-27 ENCOUNTER — Ambulatory Visit (INDEPENDENT_AMBULATORY_CARE_PROVIDER_SITE_OTHER): Payer: 59 | Admitting: Family

## 2014-03-27 DIAGNOSIS — R208 Other disturbances of skin sensation: Secondary | ICD-10-CM

## 2014-03-27 DIAGNOSIS — R200 Anesthesia of skin: Secondary | ICD-10-CM

## 2014-03-27 DIAGNOSIS — M545 Low back pain, unspecified: Secondary | ICD-10-CM

## 2014-03-27 DIAGNOSIS — M542 Cervicalgia: Secondary | ICD-10-CM

## 2014-03-27 MED ORDER — METHYLPREDNISOLONE 4 MG PO KIT: PACK | ORAL | Status: AC

## 2014-03-27 NOTE — Progress Notes (Signed)
Pre visit review using our clinic review tool, if applicable. No additional management support is needed unless otherwise documented below in the visit note. 

## 2014-03-27 NOTE — Patient Instructions (Signed)
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
## 2014-03-29 ENCOUNTER — Ambulatory Visit: Payer: 59 | Attending: Family

## 2014-03-29 ENCOUNTER — Telehealth: Payer: Self-pay | Admitting: Family

## 2014-03-29 DIAGNOSIS — M545 Low back pain: Secondary | ICD-10-CM | POA: Diagnosis not present

## 2014-03-29 DIAGNOSIS — M25519 Pain in unspecified shoulder: Secondary | ICD-10-CM | POA: Insufficient documentation

## 2014-03-29 DIAGNOSIS — M542 Cervicalgia: Secondary | ICD-10-CM | POA: Insufficient documentation

## 2014-03-29 NOTE — Telephone Encounter (Signed)
Per Oran ReinPadonda, ok to give note but call PT to ensure that the suggestion was made.  Left message for PT to return my call

## 2014-03-29 NOTE — Telephone Encounter (Signed)
I stated the same thing to the patient but the therapist told the her they cannot do that for her - it needed to be her PCP. Please call patient to discuss.

## 2014-03-29 NOTE — Telephone Encounter (Signed)
She will need to get the note from her physical therapist

## 2014-03-29 NOTE — Telephone Encounter (Signed)
Physical therapist suggested she not return to work quite yet. Patient will need a note for her employer.

## 2014-03-30 NOTE — Telephone Encounter (Signed)
Spoke with Deanna ArtisKeisha at PT. 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 Note for pt to return to work 04/10/2014.  Pt aware note is ready for pick up

## 2014-04-04 ENCOUNTER — Ambulatory Visit: Payer: 59

## 2014-04-04 DIAGNOSIS — M542 Cervicalgia: Secondary | ICD-10-CM | POA: Diagnosis not present

## 2014-04-05 ENCOUNTER — Ambulatory Visit: Payer: 59 | Admitting: Physical Therapy

## 2014-04-11 ENCOUNTER — Ambulatory Visit: Payer: 59

## 2014-04-11 ENCOUNTER — Telehealth: Payer: Self-pay | Admitting: Family

## 2014-04-11 DIAGNOSIS — M5430 Sciatica, unspecified side: Secondary | ICD-10-CM

## 2014-04-11 DIAGNOSIS — M542 Cervicalgia: Secondary | ICD-10-CM | POA: Diagnosis not present

## 2014-04-11 NOTE — Telephone Encounter (Signed)
Patient stated that she needs a note to be out of work due to her job not providing light duty. Patient has an appointment on Dec 2 and she states that the therapist may be contacting the doctor with recommendations.

## 2014-04-11 NOTE — Telephone Encounter (Signed)
Please advise 

## 2014-04-12 NOTE — Telephone Encounter (Signed)
Pt aware and note printed

## 2014-04-12 NOTE — Telephone Encounter (Signed)
I am referring to orthopedics for additional assistance. I would expect her to be improving by now. OK to provide note.

## 2014-04-17 ENCOUNTER — Ambulatory Visit: Payer: 59

## 2014-04-17 DIAGNOSIS — M542 Cervicalgia: Secondary | ICD-10-CM | POA: Diagnosis not present

## 2014-04-19 ENCOUNTER — Ambulatory Visit: Payer: 59 | Attending: Family

## 2014-04-19 ENCOUNTER — Ambulatory Visit: Payer: 59 | Admitting: Family

## 2014-04-19 DIAGNOSIS — M25519 Pain in unspecified shoulder: Secondary | ICD-10-CM | POA: Insufficient documentation

## 2014-04-19 DIAGNOSIS — M545 Low back pain: Secondary | ICD-10-CM | POA: Diagnosis not present

## 2014-04-19 DIAGNOSIS — M542 Cervicalgia: Secondary | ICD-10-CM | POA: Insufficient documentation

## 2014-04-21 ENCOUNTER — Ambulatory Visit: Payer: 59 | Admitting: Physical Therapy

## 2014-04-24 ENCOUNTER — Ambulatory Visit: Payer: 59 | Admitting: Physical Therapy

## 2014-04-25 ENCOUNTER — Ambulatory Visit: Payer: 59 | Admitting: Family

## 2014-04-26 ENCOUNTER — Ambulatory Visit: Payer: 59 | Admitting: Physical Therapy

## 2014-04-28 ENCOUNTER — Ambulatory Visit: Payer: 59 | Admitting: Physical Therapy

## 2014-04-28 DIAGNOSIS — M542 Cervicalgia: Secondary | ICD-10-CM | POA: Diagnosis not present

## 2014-05-02 ENCOUNTER — Ambulatory Visit: Payer: 59 | Admitting: Physical Therapy

## 2014-05-04 ENCOUNTER — Ambulatory Visit: Payer: 59

## 2014-05-04 DIAGNOSIS — M542 Cervicalgia: Secondary | ICD-10-CM | POA: Diagnosis not present

## 2014-05-05 ENCOUNTER — Ambulatory Visit: Payer: 59 | Admitting: Physical Therapy

## 2014-05-05 DIAGNOSIS — M542 Cervicalgia: Secondary | ICD-10-CM | POA: Diagnosis not present

## 2014-05-09 ENCOUNTER — Ambulatory Visit: Payer: 59 | Admitting: Physical Therapy

## 2014-05-09 ENCOUNTER — Ambulatory Visit (INDEPENDENT_AMBULATORY_CARE_PROVIDER_SITE_OTHER): Payer: 59 | Admitting: Family

## 2014-05-09 ENCOUNTER — Encounter: Payer: Self-pay | Admitting: Family

## 2014-05-09 VITALS — BP 108/62 | HR 78 | Wt 129.0 lb

## 2014-05-09 DIAGNOSIS — M545 Low back pain, unspecified: Secondary | ICD-10-CM | POA: Insufficient documentation

## 2014-05-09 DIAGNOSIS — M542 Cervicalgia: Secondary | ICD-10-CM | POA: Diagnosis not present

## 2014-05-09 NOTE — Progress Notes (Signed)
Pre visit review using our clinic review tool, if applicable. No additional management support is needed unless otherwise documented below in the visit note. 
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
## 2014-05-09 NOTE — Patient Instructions (Signed)
Muscle Cramps and Spasms Muscle cramps and spasms are when muscles tighten by themselves. They usually get better within minutes. Muscle cramps are painful. They are usually stronger and last longer than muscle spasms. Muscle spasms may or may not be painful. They can last a few seconds or much longer. HOME CARE  Drink enough fluid to keep your pee (urine) clear or pale yellow.  Massage, stretch, and relax the muscle.  Use a warm towel, heating pad, or warm shower water on tight muscles.  Place ice on the muscle if it is tender or in pain.  Put ice in a plastic bag.  Place a towel between your skin and the bag.  Leave the ice on for 15-20 minutes, 03-04 times a day.  Only take medicine as told by your doctor. GET HELP RIGHT AWAY IF:  Your cramps or spasms get worse, happen more often, or do not get better with time. MAKE SURE YOU:  Understand these instructions.  Will watch your condition.  Will get help right away if you are not doing well or get worse. Document Released: 04/17/2008 Document Revised: 08/30/2012 Document Reviewed: 04/21/2012 ExitCare Patient Information 2015 [employer]. This information is not intended to replace advice given to you by your health care provider. Make sure you discuss any questions you have with your health care provider.  

## 2014-05-16 ENCOUNTER — Ambulatory Visit: Payer: 59

## 2014-05-18 ENCOUNTER — Ambulatory Visit: Payer: 59 | Admitting: Physical Therapy

## 2014-05-18 DIAGNOSIS — M542 Cervicalgia: Secondary | ICD-10-CM | POA: Diagnosis not present

## 2014-05-23 ENCOUNTER — Ambulatory Visit: Payer: No Typology Code available for payment source | Attending: Family | Admitting: Physical Therapy

## 2014-05-23 DIAGNOSIS — M25519 Pain in unspecified shoulder: Secondary | ICD-10-CM | POA: Insufficient documentation

## 2014-05-23 DIAGNOSIS — M545 Low back pain: Secondary | ICD-10-CM | POA: Diagnosis not present

## 2014-05-23 DIAGNOSIS — M542 Cervicalgia: Secondary | ICD-10-CM | POA: Diagnosis not present

## 2014-05-25 ENCOUNTER — Encounter: Payer: 59 | Admitting: Physical Therapy

## 2014-05-30 ENCOUNTER — Ambulatory Visit: Payer: No Typology Code available for payment source | Admitting: Physical Therapy

## 2014-05-31 ENCOUNTER — Ambulatory Visit: Payer: No Typology Code available for payment source | Admitting: Physical Therapy

## 2014-05-31 DIAGNOSIS — M542 Cervicalgia: Secondary | ICD-10-CM | POA: Diagnosis not present

## 2014-06-01 ENCOUNTER — Ambulatory Visit: Payer: No Typology Code available for payment source | Admitting: Physical Therapy

## 2014-06-06 ENCOUNTER — Encounter: Payer: 59 | Admitting: Physical Therapy

## 2014-06-15 ENCOUNTER — Inpatient Hospital Stay (HOSPITAL_COMMUNITY): Admission: RE | Admit: 2014-06-15 | Payer: Self-pay | Source: Ambulatory Visit

## 2014-06-15 ENCOUNTER — Other Ambulatory Visit: Payer: Self-pay | Admitting: Obstetrics and Gynecology

## 2014-06-19 LAB — CYTOLOGY - PAP

## 2015-01-02 ENCOUNTER — Telehealth: Payer: Self-pay | Admitting: Family

## 2015-01-02 NOTE — Telephone Encounter (Signed)
Pt need to have a lab test done that will tell what immunizations she has had. Can we have an order placed for this please.

## 2015-01-04 NOTE — Telephone Encounter (Signed)
Pt following up on this request for a mmr titer. It was due 8/17, and she needs asap before she gets dropped from her glasses.

## 2015-01-04 NOTE — Telephone Encounter (Signed)
Nancy Hunter had a new pt/transfer appt tomorrow. Pt will see Nancy Hunter In the am and he can order the titer.  Sp please disregard this request!!

## 2015-01-05 ENCOUNTER — Encounter: Payer: Self-pay | Admitting: Adult Health

## 2015-01-05 ENCOUNTER — Ambulatory Visit (INDEPENDENT_AMBULATORY_CARE_PROVIDER_SITE_OTHER): Payer: BLUE CROSS/BLUE SHIELD | Admitting: Adult Health

## 2015-01-05 VITALS — BP 120/80 | Temp 99.4°F | Ht 65.25 in | Wt 154.4 lb

## 2015-01-05 DIAGNOSIS — Z0000 Encounter for general adult medical examination without abnormal findings: Secondary | ICD-10-CM | POA: Diagnosis not present

## 2015-01-05 LAB — BASIC METABOLIC PANEL
BUN: 10 mg/dL (ref 6–23)
CALCIUM: 9.8 mg/dL (ref 8.4–10.5)
CO2: 27 mEq/L (ref 19–32)
CREATININE: 0.78 mg/dL (ref 0.40–1.20)
Chloride: 103 mEq/L (ref 96–112)
GFR: 114.79 mL/min (ref >60.00)
Glucose, Bld: 81 mg/dL (ref 70–99)
Potassium: 3.7 mEq/L (ref 3.5–5.1)
Sodium: 139 mEq/L (ref 135–145)

## 2015-01-05 LAB — POCT URINALYSIS DIPSTICK
BILIRUBIN UA: NEGATIVE
GLUCOSE UA: NEGATIVE
KETONES UA: NEGATIVE
Leukocytes, UA: NEGATIVE
Nitrite, UA: NEGATIVE
PH UA: 7
Protein, UA: NEGATIVE
SPEC GRAV UA: 1.025
Urobilinogen, UA: 0.2

## 2015-01-05 LAB — HEPATIC FUNCTION PANEL
ALBUMIN: 4.5 g/dL (ref 3.5–5.2)
ALT: 14 U/L (ref 0–35)
AST: 20 U/L (ref 0–37)
Alkaline Phosphatase: 54 U/L (ref 39–117)
Bilirubin, Direct: 0 mg/dL (ref 0.0–0.3)
TOTAL PROTEIN: 7.8 g/dL (ref 6.0–8.3)
Total Bilirubin: 0.3 mg/dL (ref 0.2–1.2)

## 2015-01-05 LAB — CBC WITH DIFFERENTIAL/PLATELET
BASOS ABS: 0 10*3/uL (ref 0.0–0.1)
Basophils Relative: 0.3 % (ref 0.0–3.0)
Eosinophils Absolute: 0.2 10*3/uL (ref 0.0–0.7)
Eosinophils Relative: 3.5 % (ref 0.0–5.0)
HEMATOCRIT: 34.5 % — AB (ref 36.0–46.0)
Hemoglobin: 11.6 g/dL — ABNORMAL LOW (ref 12.0–15.0)
Lymphocytes Relative: 28.5 % (ref 12.0–46.0)
Lymphs Abs: 1.6 10*3/uL (ref 0.7–4.0)
MCHC: 33.7 g/dL (ref 30.0–36.0)
MCV: 80.9 fl (ref 78.0–100.0)
MONOS PCT: 9.4 % (ref 3.0–12.0)
Monocytes Absolute: 0.5 10*3/uL (ref 0.1–1.0)
NEUTROS ABS: 3.2 10*3/uL (ref 1.4–7.7)
Neutrophils Relative %: 58.3 % (ref 43.0–77.0)
PLATELETS: 223 10*3/uL (ref 150.0–400.0)
RBC: 4.27 Mil/uL (ref 3.87–5.11)
RDW: 12.9 % (ref 11.5–15.5)
WBC: 5.5 10*3/uL (ref 4.0–10.5)

## 2015-01-05 LAB — HEMOGLOBIN A1C: HEMOGLOBIN A1C: 4.8 % (ref 4.6–6.5)

## 2015-01-05 LAB — TSH: TSH: 0.59 u[IU]/mL (ref 0.35–4.50)

## 2015-01-05 NOTE — Progress Notes (Signed)
HPI:  Nancy Hunter is here to establish care/complete physical. She is a pleasant AA female with PMH of PCOS.   Last PCP and physical:Unsure Immunizations:UTD Diet:Does not follow diet, "tries to eat healthy" Exercise: No regular exercise Pap: She has a GYN, last pap in Feb 2016 - normal  LMP: 01/01/2015  Has the following chronic problems that require follow up and concerns today:  PCOS  - She started metformin in July 2016 for this. Has no complaints.   Blood Work -She needs titers for college during this visit.   ROS negative for unless reported above: fevers, chills,feeling poorly, unintentional weight loss, hearing or vision loss, chest pain, palpitations, leg claudication, struggling to breath,Not feeling congested in the chest, no orthopenia, no cough,no wheezing, normal appetite, no soft tissue swelling, no hemoptysis, melena, hematochezia, hematuria, falls, loc, si, or thoughts of self harm.   Past Medical History  Diagnosis Date  . PCOS (polycystic ovarian syndrome)     No past surgical history on file.  Family History  Problem Relation Age of Onset  . Hypertension Maternal Grandmother   . Hyperlipidemia Maternal Grandmother   . Arthritis Maternal Grandmother   . Diabetes Maternal Grandfather   . Hypertension Maternal Grandfather     Social History   Social History  . Marital Status: Single    Spouse Name: N/A  . Number of Children: N/A  . Years of Education: N/A   Social History Main Topics  . Smoking status: Never Smoker   . Smokeless tobacco: None  . Alcohol Use: Yes     Comment: once a month  . Drug Use: No  . Sexual Activity: Yes    Birth Control/ Protection: None   Other Topics Concern  . 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  cyclobenzaprine (FLEXERIL) 10 MG tablet, TK 1 T PO  QHS., Disp: , Rfl: 1 .  ibuprofen (ADVIL,MOTRIN) 800 MG tablet, Take 1 tablet  (800 mg total) by mouth 3 (three) times daily., Disp: 21 tablet, Rfl: 0 .  letrozole (FEMARA) 2.5 MG tablet, , Disp: , Rfl: 3 .  metFORMIN (GLUCOPHAGE) 500 MG tablet, Take 1 tablet by mouth 2 (two) times daily., Disp: , Rfl: 3 .  OVIDREL 250 MCG/0.5ML injection, INJECT 250 MCG INTO THE SKIN ONCE, Disp: , Rfl: 3  EXAM:  Filed Vitals:   01/05/15 1026  BP: 120/80  Temp: 99.4 F (37.4 C)    Body mass index is 25.51 kg/(m^2).  GENERAL: vitals reviewed and listed above, alert, oriented, appears well hydrated and in no acute distress  HEENT: atraumatic, conjunttiva clear, no obvious abnormalities on inspection of external nose and ears  NECK: Neck is soft and supple without masses, no adenopathy or thyromegaly, trachea midline, no JVD. Normal range of motion.   LUNGS: clear to auscultation bilaterally, no wheezes, rales or rhonchi, good air movement  CV: Regular rate and rhythm, normal S1/S2, no audible murmurs, gallops, or rubs. No carotid bruit and no peripheral edema.   MS: moves all extremities without noticeable abnormality. No edema noted  Abd: soft/nontender/nondistended/normal bowel sounds   Skin: warm and dry, no rash , acne on face  Extremities: No clubbing, cyanosis, or edema. Capillary refill is WNL. Pulses intact bilaterally in upper and lower extremities.   Neuro: CN II-XII intact, sensation and reflexes normal throughout, 5/5 muscle strength in bilateral upper  and lower extremities. Normal finger to nose. Normal rapid alternating movements.  PSYCH: pleasant and cooperative, no obvious depression or anxiety  ASSESSMENT AND PLAN:  1. Routine general medical examination at a health care facility - Measles/Mumps/Rubella Immunity - Varicella Zoster Antibody, IgG - Hep B Surface Antibody - Hep B Surface Antigen - Quantiferon tb gold assay - CBC with Differential/Platelet - Basic metabolic panel - TSH - Hemoglobin A1c - POCT urinalysis dipstick - Hepatic function  panel - Will follow up with her regarding labs.         -We reviewed the PMH, PSH, FH, SH, Meds and Allergies. -We provided refills for any medications we will prescribe as needed. -We addressed current concerns per orders and patient instructions. -We have asked for records for pertinent exams, studies, vaccines and notes from previous providers. -We have advised patient to follow up per instructions below.   -Patient advised to return or notify a provider immediately if symptoms worsen or persist or new concerns arise.  There are no Patient Instructions on file for this visit.   [employer]

## 2015-01-05 NOTE — Patient Instructions (Addendum)
It was great meeting you today!  I will follow up with you regarding your labs   Start exercising more frequently and continue eating healthy.   Follow up with me in one year for your next complete physical.

## 2015-01-06 LAB — HEPATITIS B SURFACE ANTIBODY,QUALITATIVE: Hep B S Ab: POSITIVE — AB

## 2015-01-06 LAB — HEPATITIS B SURFACE ANTIGEN: Hepatitis B Surface Ag: NEGATIVE

## 2015-01-07 LAB — QUANTIFERON TB GOLD ASSAY (BLOOD)
INTERFERON GAMMA RELEASE ASSAY: NEGATIVE
QUANTIFERON NIL VALUE: 0.03 [IU]/mL
Quantiferon Tb Ag Minus Nil Value: 0 IU/mL
TB Ag value: 0.03 IU/mL

## 2015-01-08 ENCOUNTER — Telehealth: Payer: Self-pay | Admitting: Adult Health

## 2015-01-08 LAB — VARICELLA ZOSTER ANTIBODY, IGG: Varicella IgG: >4000 Index — ABNORMAL HIGH (ref <135.00)

## 2015-01-08 LAB — MEASLES/MUMPS/RUBELLA IMMUNITY
Mumps IgG: >300 AU/mL — ABNORMAL HIGH (ref <9.00)
RUBEOLA IGG: 14.6 [AU]/ml (ref <25.00)
Rubella: 19.7 Index — ABNORMAL HIGH (ref <0.90)

## 2015-01-08 NOTE — Telephone Encounter (Signed)
Pt would like results of labs. Please call back. °

## 2015-01-09 ENCOUNTER — Ambulatory Visit (INDEPENDENT_AMBULATORY_CARE_PROVIDER_SITE_OTHER): Payer: BLUE CROSS/BLUE SHIELD

## 2015-01-09 DIAGNOSIS — Z23 Encounter for immunization: Secondary | ICD-10-CM | POA: Diagnosis not present

## 2015-01-17 ENCOUNTER — Encounter: Payer: Self-pay | Admitting: Adult Health

## 2015-01-17 ENCOUNTER — Ambulatory Visit (INDEPENDENT_AMBULATORY_CARE_PROVIDER_SITE_OTHER): Payer: BLUE CROSS/BLUE SHIELD | Admitting: Adult Health

## 2015-01-17 VITALS — BP 118/78 | HR 100 | Temp 98.2°F | Resp 18 | Ht 64.0 in | Wt 153.7 lb

## 2015-01-17 DIAGNOSIS — R0781 Pleurodynia: Secondary | ICD-10-CM

## 2015-01-17 MED ORDER — OXYCODONE-ACETAMINOPHEN 5-325 MG PO TABS: 1.0000 | ORAL_TABLET | Freq: Three times a day (TID) | ORAL | Status: DC | PRN

## 2015-01-17 NOTE — Progress Notes (Signed)
Subjective:    Patient ID: Nancy Hunter, female    DOB: [birth year], 24 y.o.   MRN: [id number]  HPI  Patient presents to the office today for left rib pain. Last night she was in the bathroom with the light off, she tripped on one of the children's toys, which caused her to fall, hitting her left ribs on the side of the bath tub. She has SOB when she lays down. Has severe pain in the left side of the ribs. She has been using heat and ice for the pain. Has not taken any OTC medication   She denies bruising or hemoptysis. She denies LOC    Review of Systems  Constitutional: Negative.   Respiratory: Positive for shortness of breath (with laying down). Negative for chest tightness and wheezing.   Cardiovascular: Negative.   Gastrointestinal: Negative.   Musculoskeletal: Positive for myalgias. Negative for back pain, joint swelling, arthralgias, gait problem, neck pain and neck stiffness.  Neurological: Negative.   All other systems reviewed and are negative.  Past Medical History  Diagnosis Date  . PCOS (polycystic ovarian syndrome)     Social History   Social History  . Marital Status: Single    Spouse Name: N/A  . Number of Children: N/A  . Years of Education: N/A   Occupational History  . Not on file.   Social History Main Topics  . Smoking status: Never Smoker   . Smokeless tobacco: Not on file  . Alcohol Use: Yes     Comment: once a month  . Drug Use: No  . Sexual Activity: Yes    Birth Control/ Protection: None   Other Topics Concern  . 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  Family History  Problem Relation Age of Onset  . Hypertension Maternal Grandmother   . Hyperlipidemia Maternal Grandmother   . Arthritis Maternal Grandmother   . Diabetes Maternal Grandfather   . Hypertension Maternal Grandfather     No Known Allergies  Current Outpatient Prescriptions on File  Prior to Visit  Medication Sig Dispense Refill  . cyclobenzaprine (FLEXERIL) 10 MG tablet TK 1 T PO  QHS.  1  . ibuprofen (ADVIL,MOTRIN) 800 MG tablet Take 1 tablet (800 mg total) by mouth 3 (three) times daily. 21 tablet 0  . letrozole (FEMARA) 2.5 MG tablet   3  . metFORMIN (GLUCOPHAGE) 500 MG tablet Take 1 tablet by mouth 2 (two) times daily.  3  . OVIDREL 250 MCG/0.5ML injection INJECT 250 MCG INTO THE SKIN ONCE  3   No current facility-administered medications on file prior to visit.    BP 118/78 mmHg  Pulse 100  Temp(Src) 98.2 F (36.8 C) (Oral)  Resp 18  Ht  (1.626 m)  Wt 153 lb 11.2 oz (69.718 kg)  BMI 26.37 kg/m2  SpO2 97%       Objective:   Physical Exam  Constitutional: She is oriented to person, place, and time. She appears well-developed and well-nourished. No distress.  Cardiovascular: Normal rate, regular rhythm, normal heart sounds and intact distal pulses.  Exam reveals no gallop and no friction rub.   No murmur heard. Pulmonary/Chest: Effort normal and breath sounds normal. No respiratory distress. She has no wheezes. She has no rales. She exhibits no tenderness.  Musculoskeletal: Normal range of motion. She exhibits tenderness. She exhibits  no edema.  Tenderness to left rib cage from rib 7-10.  No bruising or swelling noted.  No crepitis  Neurological: She is alert and oriented to person, place, and time.  Skin: Skin is warm and dry. No rash noted. No erythema. No pallor.  Psychiatric: She has a normal mood and affect. Her behavior is normal. Judgment and thought content normal.  Nursing note and vitals reviewed.     Assessment & Plan:  1. Rib pain on left side - DG Chest 2 View; Future - oxyCODONE-acetaminophen (ROXICET) 5-325 MG per tablet; Take 1 tablet by mouth every 8 (eight) hours as needed for severe pain.  Dispense: 15 tablet; Refill: 0 - Take ibuprofen every 8 hours  - Continue to use heat and ice.  - Do deep breathing exercises.

## 2015-01-17 NOTE — Patient Instructions (Signed)
It was great seeing you today!  Follow up at [facility] for the Xray  Take the Percocet every 8 hours as needed for pain  Continue to take the Ibuprofen and Flexeril as needed.

## 2015-01-29 ENCOUNTER — Encounter: Payer: Self-pay | Admitting: Adult Health

## 2015-01-29 ENCOUNTER — Ambulatory Visit (INDEPENDENT_AMBULATORY_CARE_PROVIDER_SITE_OTHER): Payer: BLUE CROSS/BLUE SHIELD | Admitting: Adult Health

## 2015-01-29 VITALS — BP 134/90 | Temp 98.5°F | Ht 64.0 in | Wt 154.4 lb

## 2015-01-29 DIAGNOSIS — R110 Nausea: Secondary | ICD-10-CM | POA: Diagnosis not present

## 2015-01-29 DIAGNOSIS — R5383 Other fatigue: Secondary | ICD-10-CM

## 2015-01-29 MED ORDER — ONDANSETRON 4 MG PO TBDP: 4.0000 mg | ORAL_TABLET | Freq: Three times a day (TID) | ORAL | Status: DC | PRN

## 2015-01-29 NOTE — Progress Notes (Signed)
Pre visit review using our clinic review tool, if applicable. No additional management support is needed unless otherwise documented below in the visit note. 

## 2015-01-29 NOTE — Patient Instructions (Signed)
- Continue to stay well hydrated, water, Gatorade and/or Pedialyte.  Parke Simmers diet for the next 24 hours - Zofran for nausea every 8 hours as needed - Follow up if your symptoms return.    Viral Gastroenteritis Viral gastroenteritis is also known as stomach flu. This condition affects the stomach and intestinal tract. It can cause sudden diarrhea and vomiting. The illness typically lasts 3 to 8 days. Most people develop an immune response that eventually gets rid of the virus. While this natural response develops, the virus can make you quite ill. CAUSES  Many different viruses can cause gastroenteritis, such as rotavirus or noroviruses. You can catch one of these viruses by consuming contaminated food or water. You may also catch a virus by sharing utensils or other personal items with an infected person or by touching a contaminated surface. SYMPTOMS  The most common symptoms are diarrhea and vomiting. These problems can cause a severe loss of body fluids (dehydration) and a body salt (electrolyte) imbalance. Other symptoms may include:  Fever.  Headache.  Fatigue.  Abdominal pain. DIAGNOSIS  Your caregiver can usually diagnose viral gastroenteritis based on your symptoms and a physical exam. A stool sample may also be taken to test for the presence of viruses or other infections. TREATMENT  This illness typically goes away on its own. Treatments are aimed at rehydration. The most serious cases of viral gastroenteritis involve vomiting so severely that you are not able to keep fluids down. In these cases, fluids must be given through an intravenous line (IV). HOME CARE INSTRUCTIONS   Drink enough fluids to keep your urine clear or pale yellow. Drink small amounts of fluids frequently and increase the amounts as tolerated.  Ask your caregiver for specific rehydration instructions.  Avoid:  Foods high in sugar.  Alcohol.  Carbonated drinks.  Tobacco.  Juice.  Caffeine  drinks.  Extremely hot or cold fluids.  Fatty, greasy foods.  Too much intake of anything at one time.  Dairy products until 24 to 48 hours after diarrhea stops.  You may consume probiotics. Probiotics are active cultures of beneficial bacteria. They may lessen the amount and number of diarrheal stools in adults. Probiotics can be found in yogurt with active cultures and in supplements.  Wash your hands well to avoid spreading the virus.  Only take over-the-counter or prescription medicines for pain, discomfort, or fever as directed by your caregiver. Do not give aspirin to children. Antidiarrheal medicines are not recommended.  Ask your caregiver if you should continue to take your regular prescribed and over-the-counter medicines.  Keep all follow-up appointments as directed by your caregiver. SEEK IMMEDIATE MEDICAL CARE IF:   You are unable to keep fluids down.  You do not urinate at least once every 6 to 8 hours.  You develop shortness of breath.  You notice blood in your stool or vomit. This may look like coffee grounds.  You have abdominal pain that increases or is concentrated in one small area (localized).  You have persistent vomiting or diarrhea.  You have a fever.  The patient is a child younger than 3 months, and he or she has a fever.  The patient is a child older than 3 months, and he or she has a fever and persistent symptoms.  The patient is a child older than 3 months, and he or she has a fever and symptoms suddenly get worse.  The patient is a baby, and he or she has no tears when  crying. MAKE SURE YOU:   Understand these instructions.  Will watch your condition.  Will get help right away if you are not doing well or get worse. Document Released: 05/05/2005 Document Revised: 07/28/2011 Document Reviewed: 02/19/2011 [facility] Patient Information 2015 [place], [place]. This information is not intended to replace advice given to you by your health care  provider. Make sure you discuss any questions you have with your health care provider.

## 2015-01-29 NOTE — Progress Notes (Signed)
Subjective:    Patient ID: Nancy Hunter, female    DOB: [birth year], 24 y.o.   MRN: [id number]  HPI  24 year old female who presents to the office for fatigue s/p two days of N/V/D. Her last episode of vomiting was Saturday morning and her last episode of diarrhea was Saturday night. She endorses continued nausea but is able to keep food and drink down.   Review of Systems  Constitutional: Positive for fatigue.  Gastrointestinal: Positive for nausea. Negative for vomiting, abdominal pain, diarrhea, constipation and abdominal distention.  Genitourinary: Negative.   Skin: Negative.   Neurological: Negative.   All other systems reviewed and are negative.  Past Medical History  Diagnosis Date  . PCOS (polycystic ovarian syndrome)     Social History   Social History  . Marital Status: Single    Spouse Name: N/A  . Number of Children: N/A  . Years of Education: N/A   Occupational History  . Not on file.   Social History Main Topics  . Smoking status: Never Smoker   . Smokeless tobacco: Not on file  . Alcohol Use: Yes     Comment: once a month  . Drug Use: No  . Sexual Activity: Yes    Birth Control/ Protection: None   Other Topics Concern  . 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  Family History  Problem Relation Age of Onset  . Hypertension Maternal Grandmother   . Hyperlipidemia Maternal Grandmother   . Arthritis Maternal Grandmother   . Diabetes Maternal Grandfather   . Hypertension Maternal Grandfather     No Known Allergies  Current Outpatient Prescriptions on File Prior to Visit  Medication Sig Dispense Refill  . cyclobenzaprine (FLEXERIL) 10 MG tablet TK 1 T PO  QHS.  1  . ibuprofen (ADVIL,MOTRIN) 800 MG tablet Take 1 tablet (800 mg total) by mouth 3 (three) times daily. 21 tablet 0  . letrozole (FEMARA) 2.5 MG tablet   3  . metFORMIN (GLUCOPHAGE) 500 MG tablet Take 1  tablet by mouth 2 (two) times daily.  3  . OVIDREL 250 MCG/0.5ML injection INJECT 250 MCG INTO THE SKIN ONCE  3  . oxyCODONE-acetaminophen (ROXICET) 5-325 MG per tablet Take 1 tablet by mouth every 8 (eight) hours as needed for severe pain. 15 tablet 0   No current facility-administered medications on file prior to visit.    BP 134/90 mmHg  Temp(Src) 98.5 F (36.9 C) (Oral)  Ht 5\' 4"  (1.626 m)  Wt 154 lb 6.4 oz (70.035 kg)  BMI 26.49 kg/m2       Objective:   Physical Exam  Constitutional: She is oriented to person, place, and time. She appears well-developed and well-nourished. No distress.  Cardiovascular: Normal rate, regular rhythm, normal heart sounds and intact distal pulses.  Exam reveals no gallop and no friction rub.   No murmur heard. Pulmonary/Chest: Effort normal and breath sounds normal. No respiratory distress. She has no wheezes. She has no rales. She exhibits no tenderness.  Abdominal: Soft. Bowel sounds are normal. She exhibits no distension. There is no tenderness. There is no rebound and no guarding.  Neurological: She is alert and oriented to person, place, and time.  Skin: Skin is warm and dry. No rash noted. No erythema. No pallor.  Oral mucosa pink and moist  Psychiatric: She has  a normal mood and affect. Her behavior is normal. Judgment and thought content normal.  Nursing note and vitals reviewed.      Assessment & Plan:  1. Other fatigue - Continue to drink fluids ( water, Gatorade, Pedilyte)  - BRAT diet for next 24 hours - Rest - Follow up as needed  2. Nausea - ondansetron (ZOFRAN ODT) 4 MG disintegrating tablet; Take 1 tablet (4 mg total) by mouth every 8 (eight) hours as needed for nausea or vomiting.  Dispense: 20 tablet; Refill: 0

## 2015-02-28 ENCOUNTER — Telehealth: Payer: Self-pay | Admitting: Adult Health

## 2015-02-28 NOTE — Telephone Encounter (Signed)
Pt said she is needing MMR and 2 more booster shot .Marland Kitchen Would like a call back    [phone]

## 2015-03-02 NOTE — Telephone Encounter (Signed)
Called and spoke with pt and pt states that she found her immunization records from her high school and she turned them in to the college.  Pt states on there it showed she had 2 MMR injections as a child and then she received the 1 MMR in our office.  Advised pt that per the CDC pt only needs 1 MMR as an adult.  Pt states she was told she needed 3 booster shots of tetatus.  Advised pt that she received her  tetanus last year.  Pt states the school wants her to get another MMR but she is currently on treatment and she cannot get it at this time. Pt has asked that provider to write a letter stating that for her school.  Advised pt to keep us updated if she needed further information.  

## 2015-08-22 DIAGNOSIS — G5602 Carpal tunnel syndrome, left upper limb: Secondary | ICD-10-CM | POA: Diagnosis not present

## 2015-08-22 DIAGNOSIS — G5601 Carpal tunnel syndrome, right upper limb: Secondary | ICD-10-CM | POA: Diagnosis not present

## 2015-08-22 DIAGNOSIS — M5417 Radiculopathy, lumbosacral region: Secondary | ICD-10-CM | POA: Diagnosis not present

## 2015-09-08 DIAGNOSIS — F419 Anxiety disorder, unspecified: Secondary | ICD-10-CM | POA: Diagnosis not present

## 2015-09-14 DIAGNOSIS — F419 Anxiety disorder, unspecified: Secondary | ICD-10-CM | POA: Diagnosis not present

## 2015-09-24 DIAGNOSIS — N970 Female infertility associated with anovulation: Secondary | ICD-10-CM | POA: Diagnosis not present

## 2015-09-24 DIAGNOSIS — F419 Anxiety disorder, unspecified: Secondary | ICD-10-CM | POA: Diagnosis not present

## 2015-10-03 ENCOUNTER — Ambulatory Visit (INDEPENDENT_AMBULATORY_CARE_PROVIDER_SITE_OTHER): Payer: BLUE CROSS/BLUE SHIELD | Admitting: Adult Health

## 2015-10-03 ENCOUNTER — Encounter: Payer: Self-pay | Admitting: Adult Health

## 2015-10-03 VITALS — BP 112/70 | HR 80 | Temp 98.7°F | Ht 64.0 in | Wt 142.5 lb

## 2015-10-03 DIAGNOSIS — G43711 Chronic migraine without aura, intractable, with status migrainosus: Secondary | ICD-10-CM | POA: Diagnosis not present

## 2015-10-03 DIAGNOSIS — G479 Sleep disorder, unspecified: Secondary | ICD-10-CM

## 2015-10-03 DIAGNOSIS — G43909 Migraine, unspecified, not intractable, without status migrainosus: Secondary | ICD-10-CM | POA: Insufficient documentation

## 2015-10-03 MED ORDER — AMITRIPTYLINE HCL 50 MG PO TABS: 50.0000 mg | ORAL_TABLET | Freq: Every day | ORAL | Status: DC

## 2015-10-03 NOTE — Progress Notes (Signed)
Subjective:    Patient ID: Nancy Hunter, female    DOB: Nov 19, 1990, 25 y.o.   MRN: [id number]  HPI  25 year old female who presents to the office today for two acute  complaints.   1) Headaches. This has been an issue for about a month. She reports that she has them every other day and they vary in intensity from " being able to function to not being able to get out of bed." The pain is usually unilateral in the front of the head but can sometimes be in the back of the neck. She does report photophoia and watery eyes when the headaches become really bad. She does have a history of migraines in the past but they were never this frequent. Uses tylenol or motrin, helps with the low grade headaches.   She has not started any new medications.   BP Readings from Last 3 Encounters:  10/03/15 112/70  01/29/15 134/90  01/17/15 118/78    2) Insomnia  - This has been an issue for the last few months. She has trouble staying asleep and staying asleep. This is a new issue for the patient. She believes that her chronic back pain is a culprit in her not being able to fall asleep. She does see neurology for this and gets epidural shots.   She has been using Unisom which helps   Review of Systems  Constitutional: Positive for activity change, appetite change and fatigue. Negative for fever, chills, diaphoresis and unexpected weight change.  Respiratory: Negative.   Cardiovascular: Negative.   Neurological: Positive for headaches. Negative for dizziness, weakness and light-headedness.  Psychiatric/Behavioral: Positive for sleep disturbance and agitation.  All other systems reviewed and are negative.  Past Medical History  Diagnosis Date  . PCOS (polycystic ovarian syndrome)   . Chronic back pain     Social History   Social History  . Marital Status: Single    Spouse Name: N/A  . Number of Children: N/A  . Years of Education: N/A   Occupational History  . Not on file.   Social History  Main Topics  . Smoking status: Never Smoker   . Smokeless tobacco: Not on file  . Alcohol Use: Yes     Comment: once a month  . Drug Use: No  . Sexual Activity: Yes    Birth Control/ Protection: None   Other Topics Concern  . 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  Family History  Problem Relation Age of Onset  . Hypertension Maternal Grandmother   . Hyperlipidemia Maternal Grandmother   . Arthritis Maternal Grandmother   . Diabetes Maternal Grandfather   . Hypertension Maternal Grandfather     No Known Allergies  No current outpatient prescriptions on file prior to visit.   No current facility-administered medications on file prior to visit.    BP 112/70 mmHg  Pulse 80  Temp(Src) 98.7 F (37.1 C) (Oral)  Ht 5\' 4"  (1.626 m)  Wt 142 lb 8 oz (64.638 kg)  BMI 24.45 kg/m2  SpO2 97%  LMP 04/12/2015 (Exact Date)       Objective:   Physical Exam  Constitutional: She is oriented to person, place, and time. She appears well-developed and well-nourished. No distress.  HENT:  Head: Normocephalic and atraumatic.  Right Ear: External ear normal.  Left Ear: External ear  normal.  Nose: Nose normal.  Mouth/Throat: Oropharynx is clear and moist. No oropharyngeal exudate.  Eyes: Conjunctivae and EOM are normal. Pupils are equal, round, and reactive to light. Right eye exhibits no discharge. Left eye exhibits no discharge.  Neck: Normal range of motion. Neck supple. No thyromegaly present.  Cardiovascular: Normal rate, normal heart sounds and intact distal pulses.  Exam reveals no gallop and no friction rub.   No murmur heard. Pulmonary/Chest: Effort normal and breath sounds normal. No respiratory distress. She has no wheezes. She has no rales. She exhibits no tenderness.  Lymphadenopathy:    She has no cervical adenopathy.  Neurological: She is alert and oriented to person, place, and time.    Skin: Skin is warm and dry. No rash noted. She is not diaphoretic. No erythema. No pallor.  Psychiatric: She has a normal mood and affect. Her behavior is normal. Judgment and thought content normal.  Vitals reviewed.     Assessment & Plan:  1. Intractable chronic migraine without aura and with status migrainosus - Will trial migraine prophylaxis  - amitriptyline (ELAVIL) 50 MG tablet; Take 1 tablet (50 mg total) by mouth at bedtime.  Dispense: 30 tablet; Refill: 3 - Follow up if no improvement in a week  2. Sleep disturbance - Stop taking Unisom - amitriptyline (ELAVIL) 50 MG tablet; Take 1 tablet (50 mg total) by mouth at bedtime.  Dispense: 30 tablet; Refill: 3 - Hopefully this will help with the back pain as well.  - follow up if no improvement   Shirline Frees, NP

## 2015-10-03 NOTE — Patient Instructions (Addendum)
It was great seeing you again today !  I am sorry you are feeling this way.   I have sent in a prescription for Elavil. Take this every night. It will help with the migraines and will make you sleepy.   Let me know if this is not working  Recurrent Migraine Headache A migraine headache is an intense, throbbing pain on one or both sides of your head. Recurrent migraines keep coming back. A migraine can last for 30 minutes to several hours. CAUSES  The exact cause of a migraine headache is not always known. However, a migraine may be caused when nerves in the brain become irritated and release chemicals that cause inflammation. This causes pain. Certain things may also trigger migraines, such as:   Alcohol.  Smoking.  Stress.  Menstruation.  Aged cheeses.  Foods or drinks that contain nitrates, glutamate, aspartame, or tyramine.  Lack of sleep.  Chocolate.  Caffeine.  Hunger.  Physical exertion.  Fatigue.  Medicines used to treat chest pain (nitroglycerine), birth control pills, estrogen, and some blood pressure medicines. SYMPTOMS   Pain on one or both sides of your head.  Pulsating or throbbing pain.  Severe pain that prevents daily activities.  Pain that is aggravated by any physical activity.  Nausea, vomiting, or both.  Dizziness.  Pain with exposure to bright lights, loud noises, or activity.  General sensitivity to bright lights, loud noises, or smells. Before you get a migraine, you may get warning signs that a migraine is coming (aura). An aura may include:  Seeing flashing lights.  Seeing bright spots, halos, or zigzag lines.  Having tunnel vision or blurred vision.  Having feelings of numbness or tingling.  Having trouble talking.  Having muscle weakness. DIAGNOSIS  A recurrent migraine headache is often diagnosed based on:  Symptoms.  Physical examination.  A CT scan or MRI of your head. These imaging tests cannot diagnose  migraines but can help rule out other causes of headaches.  TREATMENT  Medicines may be given for pain and nausea. Medicines can also be given to help prevent recurrent migraines. HOME CARE INSTRUCTIONS  Only take over-the-counter or prescription medicines for pain or discomfort as directed by your health care provider. The use of long-term narcotics is not recommended.  Lie down in a dark, quiet room when you have a migraine.  Keep a journal to find out what may trigger your migraine headaches. For example, write down:  What you eat and drink.  How much sleep you get.  Any change to your diet or medicines.  Limit alcohol consumption.  Quit smoking if you smoke.  Get 7-9 hours of sleep, or as recommended by your health care provider.  Limit stress.  Keep lights dim if bright lights bother you and make your migraines worse. SEEK MEDICAL CARE IF:   You do not get relief from the medicines given to you.  You have a recurrence of pain.  You have a fever. SEEK IMMEDIATE MEDICAL CARE IF:  Your migraine becomes severe.  You have a stiff neck.  You have loss of vision.  You have muscular weakness or loss of muscle control.  You start losing your balance or have trouble walking.  You feel faint or pass out.  You have severe symptoms that are different from your first symptoms. MAKE SURE YOU:   Understand these instructions.  Will watch your condition.  Will get help right away if you are not doing well or get worse.  This information is not intended to replace advice given to you by your health care provider. Make sure you discuss any questions you have with your health care provider.   Document Released: 01/28/2001 Document Revised: 05/26/2014 Document Reviewed: 01/10/2013 [employer] Interactive Patient Education [employer]

## 2015-10-03 NOTE — Progress Notes (Signed)
Pre visit review using our clinic review tool, if applicable. No additional management support is needed unless otherwise documented below in the visit note. 

## 2015-10-05 DIAGNOSIS — F419 Anxiety disorder, unspecified: Secondary | ICD-10-CM | POA: Diagnosis not present

## 2015-10-11 DIAGNOSIS — Z3141 Encounter for fertility testing: Secondary | ICD-10-CM | POA: Diagnosis not present

## 2015-10-12 DIAGNOSIS — F419 Anxiety disorder, unspecified: Secondary | ICD-10-CM | POA: Diagnosis not present

## 2015-10-18 DIAGNOSIS — R202 Paresthesia of skin: Secondary | ICD-10-CM | POA: Diagnosis not present

## 2015-10-18 DIAGNOSIS — R201 Hypoesthesia of skin: Secondary | ICD-10-CM | POA: Diagnosis not present

## 2015-10-18 DIAGNOSIS — M5417 Radiculopathy, lumbosacral region: Secondary | ICD-10-CM | POA: Diagnosis not present

## 2015-10-27 DIAGNOSIS — F419 Anxiety disorder, unspecified: Secondary | ICD-10-CM | POA: Diagnosis not present

## 2015-11-09 DIAGNOSIS — E282 Polycystic ovarian syndrome: Secondary | ICD-10-CM | POA: Diagnosis not present

## 2015-11-19 DIAGNOSIS — F419 Anxiety disorder, unspecified: Secondary | ICD-10-CM | POA: Diagnosis not present

## 2015-11-28 DIAGNOSIS — F419 Anxiety disorder, unspecified: Secondary | ICD-10-CM | POA: Diagnosis not present

## 2015-11-29 DIAGNOSIS — Z3141 Encounter for fertility testing: Secondary | ICD-10-CM | POA: Diagnosis not present

## 2015-12-06 DIAGNOSIS — Z3141 Encounter for fertility testing: Secondary | ICD-10-CM | POA: Diagnosis not present

## 2015-12-07 DIAGNOSIS — F411 Generalized anxiety disorder: Secondary | ICD-10-CM | POA: Diagnosis not present

## 2015-12-10 DIAGNOSIS — Z3141 Encounter for fertility testing: Secondary | ICD-10-CM | POA: Diagnosis not present

## 2015-12-12 DIAGNOSIS — Z3141 Encounter for fertility testing: Secondary | ICD-10-CM | POA: Diagnosis not present

## 2015-12-14 DIAGNOSIS — E282 Polycystic ovarian syndrome: Secondary | ICD-10-CM | POA: Diagnosis not present

## 2015-12-14 DIAGNOSIS — Z3141 Encounter for fertility testing: Secondary | ICD-10-CM | POA: Diagnosis not present

## 2015-12-17 DIAGNOSIS — Z3189 Encounter for other procreative management: Secondary | ICD-10-CM | POA: Diagnosis not present

## 2015-12-19 DIAGNOSIS — F411 Generalized anxiety disorder: Secondary | ICD-10-CM | POA: Diagnosis not present

## 2015-12-21 DIAGNOSIS — M9905 Segmental and somatic dysfunction of pelvic region: Secondary | ICD-10-CM | POA: Diagnosis not present

## 2015-12-21 DIAGNOSIS — M791 Myalgia: Secondary | ICD-10-CM | POA: Diagnosis not present

## 2015-12-21 DIAGNOSIS — M9903 Segmental and somatic dysfunction of lumbar region: Secondary | ICD-10-CM | POA: Diagnosis not present

## 2015-12-21 DIAGNOSIS — M9902 Segmental and somatic dysfunction of thoracic region: Secondary | ICD-10-CM | POA: Diagnosis not present

## 2015-12-25 DIAGNOSIS — M9905 Segmental and somatic dysfunction of pelvic region: Secondary | ICD-10-CM | POA: Diagnosis not present

## 2015-12-25 DIAGNOSIS — M9903 Segmental and somatic dysfunction of lumbar region: Secondary | ICD-10-CM | POA: Diagnosis not present

## 2015-12-25 DIAGNOSIS — M791 Myalgia: Secondary | ICD-10-CM | POA: Diagnosis not present

## 2015-12-25 DIAGNOSIS — M9902 Segmental and somatic dysfunction of thoracic region: Secondary | ICD-10-CM | POA: Diagnosis not present

## 2015-12-27 DIAGNOSIS — R201 Hypoesthesia of skin: Secondary | ICD-10-CM | POA: Diagnosis not present

## 2015-12-27 DIAGNOSIS — M5417 Radiculopathy, lumbosacral region: Secondary | ICD-10-CM | POA: Diagnosis not present

## 2015-12-27 DIAGNOSIS — G5602 Carpal tunnel syndrome, left upper limb: Secondary | ICD-10-CM | POA: Diagnosis not present

## 2016-01-03 ENCOUNTER — Encounter: Payer: Self-pay | Admitting: Adult Health

## 2016-01-03 ENCOUNTER — Ambulatory Visit (INDEPENDENT_AMBULATORY_CARE_PROVIDER_SITE_OTHER): Payer: BLUE CROSS/BLUE SHIELD | Admitting: Adult Health

## 2016-01-03 VITALS — BP 124/76 | Temp 98.2°F | Ht 64.0 in | Wt 153.6 lb

## 2016-01-03 DIAGNOSIS — M2550 Pain in unspecified joint: Secondary | ICD-10-CM | POA: Diagnosis not present

## 2016-01-03 MED ORDER — NAPROXEN 500 MG PO TABS: 500.0000 mg | ORAL_TABLET | Freq: Two times a day (BID) | ORAL | 0 refills | Status: DC

## 2016-01-03 MED ORDER — TRAMADOL HCL 50 MG PO TABS: 50.0000 mg | ORAL_TABLET | Freq: Three times a day (TID) | ORAL | 0 refills | Status: DC | PRN

## 2016-01-03 NOTE — Progress Notes (Signed)
Subjective:    Patient ID: Nancy Hunter, female    DOB: 1990/06/07, 25 y.o.   MRN: [id number]  HPI  25 year old female who presents to the office today for non specific joint pain. She reports that two days ago " every joint in my body started hurting". The pain is described as " burning, tingling, stabbing and shooting pain."  Yesterday when she was in the bedroom she dropped something on the floor, when she bent down to clean up the spill she reports " when I got to the ground I had to brace myself and when I did that all my muscles gave out and I ended up on the floor. I had to stay there until I felt like I was strong enough   She has not been in the woods or camping  She does have a family history of RA   Has not used any OTC medications  Review of Systems  Constitutional: Positive for activity change. Negative for chills, diaphoresis, fatigue, fever and unexpected weight change.  HENT: Negative.   Respiratory: Negative.   Cardiovascular: Negative.   Musculoskeletal: Positive for arthralgias, gait problem and myalgias.  Skin: Negative.   Neurological: Positive for weakness. Negative for dizziness, light-headedness and headaches.  All other systems reviewed and are negative.  Past Medical History:  Diagnosis Date  . Chronic back pain   . PCOS (polycystic ovarian syndrome)     Social History   Social History  . Marital status: Single    Spouse name: N/A  . Number of children: N/A  . Years of education: N/A   Occupational History  . Not on file.   Social History Main Topics  . Smoking status: Never Smoker  . Smokeless tobacco: Not on file  . Alcohol use Yes     Comment: once a month  . Drug use: No  . Sexual activity: Yes    Birth control/ protection: None   Other Topics Concern  . 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  Family History  Problem Relation Age of Onset  .  Hypertension Maternal Grandmother   . Hyperlipidemia Maternal Grandmother   . Arthritis Maternal Grandmother   . Diabetes Maternal Grandfather   . Hypertension Maternal Grandfather     No Known Allergies  No current outpatient prescriptions on file prior to visit.   No current facility-administered medications on file prior to visit.     BP 124/76   Temp 98.2 F (36.8 C) (Oral)   Ht 5\' 4"  (1.626 m)   Wt 153 lb 9.6 oz (69.7 kg)   BMI 26.37 kg/m       Objective:   Physical Exam  Constitutional: She is oriented to person, place, and time. She appears well-developed and well-nourished. No distress.  HENT:  Head: Normocephalic and atraumatic.  Right Ear: External ear normal.  Left Ear: External ear normal.  Nose: Nose normal.  Mouth/Throat: Oropharynx is clear and moist. No oropharyngeal exudate.  Eyes: Conjunctivae and EOM are normal. Pupils are equal, round, and reactive to light. Right eye exhibits no discharge. Left eye exhibits no discharge. No scleral icterus.  Neck: Normal range of motion. Neck supple. No thyromegaly present.  Cardiovascular: Normal rate, regular rhythm, normal heart sounds and intact distal pulses.  Exam reveals no gallop and no friction rub.   No murmur  heard. Pulmonary/Chest: Effort normal and breath sounds normal. No respiratory distress. She has no wheezes. She has no rales. She exhibits no tenderness.  Abdominal: Soft. Bowel sounds are normal. She exhibits no distension and no mass. There is no tenderness. There is no rebound and no guarding.  Musculoskeletal: Normal range of motion. She exhibits tenderness (multiple joint tenderness ( ankles, knees, shoulders, elbows). No redness, warmthj or edema noted). She exhibits no edema or deformity.  Lymphadenopathy:    She has no cervical adenopathy.  Neurological: She is alert and oriented to person, place, and time. She displays normal reflexes. No cranial nerve deficit. She exhibits normal muscle tone.  Coordination normal.  Skin: Skin is warm and dry. No rash noted. She is not diaphoretic. No erythema. No pallor.  Psychiatric: She has a normal mood and affect. Her behavior is normal. Judgment and thought content normal.  Nursing note and vitals reviewed.      Assessment & Plan:  1. Joint pain - Unsure of cause of her joint pain at this time. Possible causes include: Polyarthritis, Viral arthritis ,Fibromyalgia,Hypothyroidism, Neuropathic pain, Tick borne illness - traMADol (ULTRAM) 50 MG tablet; Take 1 tablet (50 mg total) by mouth every 8 (eight) hours as needed.  Dispense: 30 tablet; Refill: 0 - naproxen (NAPROSYN) 500 MG tablet; Take 1 tablet (500 mg total) by mouth 2 (two) times daily with a meal.  Dispense: 30 tablet; Refill: 0 - C-reactive Protein; Future - Sedimentation Rate; Future - ANA; Future - Rheumatoid Factor; Future - Uric Acid; Future - CBC with Differential/Platelet; Future - B. burgdorfi Antibody; Future - Lyme Ab/Western Blot Reflex; Future - Basic metabolic panel; Future - TSH; Future - Consider referral to Rheumatology or ID - advised to go to the ER with worsening pain or symptoms   Shirline Freesory Maisee Vollman, NP

## 2016-01-04 ENCOUNTER — Other Ambulatory Visit: Payer: BLUE CROSS/BLUE SHIELD

## 2016-01-04 DIAGNOSIS — M2550 Pain in unspecified joint: Secondary | ICD-10-CM | POA: Diagnosis not present

## 2016-01-04 LAB — CBC WITH DIFFERENTIAL/PLATELET
BASOS ABS: 0 {cells}/uL (ref 0–200)
Basophils Relative: 0 %
EOS ABS: 0 {cells}/uL — AB (ref 15–500)
EOS PCT: 0 %
HCT: 37.6 % (ref 35.0–45.0)
Hemoglobin: 12.3 g/dL (ref 11.7–15.5)
LYMPHS PCT: 20 %
Lymphs Abs: 2940 cells/uL (ref 850–3900)
MCH: 27.7 pg (ref 27.0–33.0)
MCHC: 32.7 g/dL (ref 32.0–36.0)
MCV: 84.7 fL (ref 80.0–100.0)
MPV: 10 fL (ref 7.5–12.5)
Monocytes Absolute: 882 cells/uL (ref 200–950)
Monocytes Relative: 6 %
Neutro Abs: 10878 cells/uL — ABNORMAL HIGH (ref 1500–7800)
Neutrophils Relative %: 74 %
PLATELETS: 280 10*3/uL (ref 140–400)
RBC: 4.44 MIL/uL (ref 3.80–5.10)
RDW: 14.3 % (ref 11.0–15.0)
WBC: 14.7 10*3/uL — ABNORMAL HIGH (ref 3.8–10.8)

## 2016-01-04 LAB — BASIC METABOLIC PANEL
BUN: 15 mg/dL (ref 7–25)
CALCIUM: 10 mg/dL (ref 8.6–10.2)
CHLORIDE: 99 mmol/L (ref 98–110)
CO2: 27 mmol/L (ref 20–31)
CREATININE: 0.94 mg/dL (ref 0.50–1.10)
Glucose, Bld: 85 mg/dL (ref 65–99)
Potassium: 4.2 mmol/L (ref 3.5–5.3)
Sodium: 138 mmol/L (ref 135–146)

## 2016-01-04 LAB — URIC ACID: Uric Acid, Serum: 3.3 mg/dL (ref 2.5–7.0)

## 2016-01-04 LAB — TSH: TSH: 0.6 mIU/L

## 2016-01-04 LAB — RHEUMATOID FACTOR

## 2016-01-05 LAB — SEDIMENTATION RATE: Sed Rate: 13 mm/hr (ref 0–20)

## 2016-01-07 LAB — ANA: Anti Nuclear Antibody(ANA): NEGATIVE

## 2016-01-07 LAB — LYME AB/WESTERN BLOT REFLEX: B burgdorferi Ab IgG+IgM: <0.9 Index (ref <0.90)

## 2016-01-07 LAB — HIGH SENSITIVITY CRP: CRP HIGH SENSITIVITY: 0.3 mg/L

## 2016-01-08 ENCOUNTER — Telehealth: Payer: Self-pay | Admitting: Adult Health

## 2016-01-08 DIAGNOSIS — D72829 Elevated white blood cell count, unspecified: Secondary | ICD-10-CM

## 2016-01-08 NOTE — Telephone Encounter (Signed)
Spoke with Nancy Hunter and informed her of her labs. All are negative except for a lightly elevated white count. We will retest in one week. She reports feeling improved but still has joint pain. We will follow up throughout the week and if needed see Rheumatology

## 2016-01-10 DIAGNOSIS — N912 Amenorrhea, unspecified: Secondary | ICD-10-CM | POA: Diagnosis not present

## 2016-01-10 DIAGNOSIS — M9903 Segmental and somatic dysfunction of lumbar region: Secondary | ICD-10-CM | POA: Diagnosis not present

## 2016-01-10 DIAGNOSIS — M9905 Segmental and somatic dysfunction of pelvic region: Secondary | ICD-10-CM | POA: Diagnosis not present

## 2016-01-10 DIAGNOSIS — Z3189 Encounter for other procreative management: Secondary | ICD-10-CM | POA: Diagnosis not present

## 2016-01-10 DIAGNOSIS — M791 Myalgia: Secondary | ICD-10-CM | POA: Diagnosis not present

## 2016-01-10 DIAGNOSIS — M9902 Segmental and somatic dysfunction of thoracic region: Secondary | ICD-10-CM | POA: Diagnosis not present

## 2016-01-15 ENCOUNTER — Other Ambulatory Visit (INDEPENDENT_AMBULATORY_CARE_PROVIDER_SITE_OTHER): Payer: BLUE CROSS/BLUE SHIELD

## 2016-01-15 DIAGNOSIS — M9902 Segmental and somatic dysfunction of thoracic region: Secondary | ICD-10-CM | POA: Diagnosis not present

## 2016-01-15 DIAGNOSIS — D649 Anemia, unspecified: Secondary | ICD-10-CM | POA: Diagnosis not present

## 2016-01-15 DIAGNOSIS — M9903 Segmental and somatic dysfunction of lumbar region: Secondary | ICD-10-CM | POA: Diagnosis not present

## 2016-01-15 DIAGNOSIS — M9905 Segmental and somatic dysfunction of pelvic region: Secondary | ICD-10-CM | POA: Diagnosis not present

## 2016-01-15 DIAGNOSIS — M791 Myalgia: Secondary | ICD-10-CM | POA: Diagnosis not present

## 2016-01-15 LAB — CBC WITH DIFFERENTIAL/PLATELET
BASOS ABS: 0 10*3/uL (ref 0.0–0.1)
BASOS PCT: 0.2 % (ref 0.0–3.0)
EOS ABS: 0 10*3/uL (ref 0.0–0.7)
Eosinophils Relative: 0.2 % (ref 0.0–5.0)
HEMATOCRIT: 34.9 % — AB (ref 36.0–46.0)
HEMOGLOBIN: 11.7 g/dL — AB (ref 12.0–15.0)
LYMPHS PCT: 16.3 % (ref 12.0–46.0)
Lymphs Abs: 2.1 10*3/uL (ref 0.7–4.0)
MCHC: 33.5 g/dL (ref 30.0–36.0)
MCV: 83.8 fl (ref 78.0–100.0)
MONO ABS: 0.8 10*3/uL (ref 0.1–1.0)
Monocytes Relative: 6.5 % (ref 3.0–12.0)
Neutro Abs: 9.8 10*3/uL — ABNORMAL HIGH (ref 1.4–7.7)
Neutrophils Relative %: 76.8 % (ref 43.0–77.0)
PLATELETS: 207 10*3/uL (ref 150.0–400.0)
RBC: 4.16 Mil/uL (ref 3.87–5.11)
RDW: 15 % (ref 11.5–15.5)
WBC: 12.8 10*3/uL — AB (ref 4.0–10.5)

## 2016-01-16 DIAGNOSIS — M9902 Segmental and somatic dysfunction of thoracic region: Secondary | ICD-10-CM | POA: Diagnosis not present

## 2016-01-16 DIAGNOSIS — M9903 Segmental and somatic dysfunction of lumbar region: Secondary | ICD-10-CM | POA: Diagnosis not present

## 2016-01-16 DIAGNOSIS — M791 Myalgia: Secondary | ICD-10-CM | POA: Diagnosis not present

## 2016-01-16 DIAGNOSIS — M9905 Segmental and somatic dysfunction of pelvic region: Secondary | ICD-10-CM | POA: Diagnosis not present

## 2016-01-19 ENCOUNTER — Other Ambulatory Visit: Payer: Self-pay | Admitting: Adult Health

## 2016-01-19 DIAGNOSIS — M2550 Pain in unspecified joint: Secondary | ICD-10-CM

## 2016-01-25 DIAGNOSIS — M791 Myalgia: Secondary | ICD-10-CM | POA: Diagnosis not present

## 2016-01-25 DIAGNOSIS — M9903 Segmental and somatic dysfunction of lumbar region: Secondary | ICD-10-CM | POA: Diagnosis not present

## 2016-01-25 DIAGNOSIS — M9905 Segmental and somatic dysfunction of pelvic region: Secondary | ICD-10-CM | POA: Diagnosis not present

## 2016-01-25 DIAGNOSIS — M9902 Segmental and somatic dysfunction of thoracic region: Secondary | ICD-10-CM | POA: Diagnosis not present

## 2016-01-29 DIAGNOSIS — M549 Dorsalgia, unspecified: Secondary | ICD-10-CM | POA: Diagnosis not present

## 2016-01-29 DIAGNOSIS — M25562 Pain in left knee: Secondary | ICD-10-CM | POA: Diagnosis not present

## 2016-01-29 DIAGNOSIS — M545 Low back pain: Secondary | ICD-10-CM | POA: Diagnosis not present

## 2016-01-29 DIAGNOSIS — M79671 Pain in right foot: Secondary | ICD-10-CM | POA: Diagnosis not present

## 2016-01-29 DIAGNOSIS — M25579 Pain in unspecified ankle and joints of unspecified foot: Secondary | ICD-10-CM | POA: Diagnosis not present

## 2016-01-29 DIAGNOSIS — M79672 Pain in left foot: Secondary | ICD-10-CM | POA: Diagnosis not present

## 2016-01-29 DIAGNOSIS — M25561 Pain in right knee: Secondary | ICD-10-CM | POA: Diagnosis not present

## 2016-01-29 DIAGNOSIS — M79641 Pain in right hand: Secondary | ICD-10-CM | POA: Diagnosis not present

## 2016-01-29 DIAGNOSIS — Z0389 Encounter for observation for other suspected diseases and conditions ruled out: Secondary | ICD-10-CM | POA: Diagnosis not present

## 2016-01-29 DIAGNOSIS — M533 Sacrococcygeal disorders, not elsewhere classified: Secondary | ICD-10-CM | POA: Diagnosis not present

## 2016-01-29 DIAGNOSIS — M2550 Pain in unspecified joint: Secondary | ICD-10-CM | POA: Diagnosis not present

## 2016-01-29 DIAGNOSIS — M25569 Pain in unspecified knee: Secondary | ICD-10-CM | POA: Diagnosis not present

## 2016-02-21 DIAGNOSIS — M2550 Pain in unspecified joint: Secondary | ICD-10-CM | POA: Diagnosis not present

## 2016-02-21 DIAGNOSIS — M25569 Pain in unspecified knee: Secondary | ICD-10-CM | POA: Diagnosis not present

## 2016-02-21 DIAGNOSIS — M549 Dorsalgia, unspecified: Secondary | ICD-10-CM | POA: Diagnosis not present

## 2016-02-22 DIAGNOSIS — M9905 Segmental and somatic dysfunction of pelvic region: Secondary | ICD-10-CM | POA: Diagnosis not present

## 2016-02-22 DIAGNOSIS — M9903 Segmental and somatic dysfunction of lumbar region: Secondary | ICD-10-CM | POA: Diagnosis not present

## 2016-02-22 DIAGNOSIS — M9902 Segmental and somatic dysfunction of thoracic region: Secondary | ICD-10-CM | POA: Diagnosis not present

## 2016-02-22 DIAGNOSIS — M791 Myalgia: Secondary | ICD-10-CM | POA: Diagnosis not present

## 2016-02-26 DIAGNOSIS — Z3141 Encounter for fertility testing: Secondary | ICD-10-CM | POA: Diagnosis not present

## 2016-03-03 DIAGNOSIS — Z3141 Encounter for fertility testing: Secondary | ICD-10-CM | POA: Diagnosis not present

## 2016-03-05 DIAGNOSIS — Z3141 Encounter for fertility testing: Secondary | ICD-10-CM | POA: Diagnosis not present

## 2016-03-07 DIAGNOSIS — Z3141 Encounter for fertility testing: Secondary | ICD-10-CM | POA: Diagnosis not present

## 2016-03-10 DIAGNOSIS — F411 Generalized anxiety disorder: Secondary | ICD-10-CM | POA: Diagnosis not present

## 2016-03-10 DIAGNOSIS — Z3141 Encounter for fertility testing: Secondary | ICD-10-CM | POA: Diagnosis not present

## 2016-03-11 DIAGNOSIS — M9902 Segmental and somatic dysfunction of thoracic region: Secondary | ICD-10-CM | POA: Diagnosis not present

## 2016-03-11 DIAGNOSIS — M9905 Segmental and somatic dysfunction of pelvic region: Secondary | ICD-10-CM | POA: Diagnosis not present

## 2016-03-11 DIAGNOSIS — M9903 Segmental and somatic dysfunction of lumbar region: Secondary | ICD-10-CM | POA: Diagnosis not present

## 2016-03-11 DIAGNOSIS — M791 Myalgia: Secondary | ICD-10-CM | POA: Diagnosis not present

## 2016-03-12 DIAGNOSIS — E282 Polycystic ovarian syndrome: Secondary | ICD-10-CM | POA: Diagnosis not present

## 2016-03-12 DIAGNOSIS — Z3141 Encounter for fertility testing: Secondary | ICD-10-CM | POA: Diagnosis not present

## 2016-03-14 DIAGNOSIS — Z3189 Encounter for other procreative management: Secondary | ICD-10-CM | POA: Diagnosis not present

## 2016-03-21 DIAGNOSIS — M791 Myalgia: Secondary | ICD-10-CM | POA: Diagnosis not present

## 2016-03-21 DIAGNOSIS — M9902 Segmental and somatic dysfunction of thoracic region: Secondary | ICD-10-CM | POA: Diagnosis not present

## 2016-03-21 DIAGNOSIS — M9905 Segmental and somatic dysfunction of pelvic region: Secondary | ICD-10-CM | POA: Diagnosis not present

## 2016-03-21 DIAGNOSIS — M9903 Segmental and somatic dysfunction of lumbar region: Secondary | ICD-10-CM | POA: Diagnosis not present

## 2016-03-25 DIAGNOSIS — M9902 Segmental and somatic dysfunction of thoracic region: Secondary | ICD-10-CM | POA: Diagnosis not present

## 2016-03-25 DIAGNOSIS — M791 Myalgia: Secondary | ICD-10-CM | POA: Diagnosis not present

## 2016-03-25 DIAGNOSIS — M9903 Segmental and somatic dysfunction of lumbar region: Secondary | ICD-10-CM | POA: Diagnosis not present

## 2016-03-25 DIAGNOSIS — M9905 Segmental and somatic dysfunction of pelvic region: Secondary | ICD-10-CM | POA: Diagnosis not present

## 2016-03-27 DIAGNOSIS — F321 Major depressive disorder, single episode, moderate: Secondary | ICD-10-CM | POA: Diagnosis not present

## 2016-03-27 DIAGNOSIS — F411 Generalized anxiety disorder: Secondary | ICD-10-CM | POA: Diagnosis not present

## 2016-03-31 DIAGNOSIS — Z3200 Encounter for pregnancy test, result unknown: Secondary | ICD-10-CM | POA: Diagnosis not present

## 2016-03-31 DIAGNOSIS — F411 Generalized anxiety disorder: Secondary | ICD-10-CM | POA: Diagnosis not present

## 2016-04-02 DIAGNOSIS — M9903 Segmental and somatic dysfunction of lumbar region: Secondary | ICD-10-CM | POA: Diagnosis not present

## 2016-04-02 DIAGNOSIS — M9902 Segmental and somatic dysfunction of thoracic region: Secondary | ICD-10-CM | POA: Diagnosis not present

## 2016-04-02 DIAGNOSIS — Z3200 Encounter for pregnancy test, result unknown: Secondary | ICD-10-CM | POA: Diagnosis not present

## 2016-04-02 DIAGNOSIS — M9905 Segmental and somatic dysfunction of pelvic region: Secondary | ICD-10-CM | POA: Diagnosis not present

## 2016-04-02 DIAGNOSIS — M791 Myalgia: Secondary | ICD-10-CM | POA: Diagnosis not present

## 2016-04-04 DIAGNOSIS — Z3200 Encounter for pregnancy test, result unknown: Secondary | ICD-10-CM | POA: Diagnosis not present

## 2016-04-07 DIAGNOSIS — Z3200 Encounter for pregnancy test, result unknown: Secondary | ICD-10-CM | POA: Diagnosis not present

## 2016-04-07 DIAGNOSIS — Z3141 Encounter for fertility testing: Secondary | ICD-10-CM | POA: Diagnosis not present

## 2016-04-09 DIAGNOSIS — O00101 Right tubal pregnancy without intrauterine pregnancy: Secondary | ICD-10-CM | POA: Diagnosis not present

## 2016-04-09 DIAGNOSIS — Z369 Encounter for antenatal screening, unspecified: Secondary | ICD-10-CM | POA: Diagnosis not present

## 2016-04-09 DIAGNOSIS — O009 Unspecified ectopic pregnancy without intrauterine pregnancy: Secondary | ICD-10-CM | POA: Diagnosis not present

## 2016-04-09 DIAGNOSIS — Z3A01 Less than 8 weeks gestation of pregnancy: Secondary | ICD-10-CM | POA: Diagnosis not present

## 2016-04-09 HISTORY — PX: ECTOPIC PREGNANCY SURGERY: SHX613

## 2016-04-12 DIAGNOSIS — O00101 Right tubal pregnancy without intrauterine pregnancy: Secondary | ICD-10-CM | POA: Diagnosis not present

## 2016-04-15 DIAGNOSIS — F411 Generalized anxiety disorder: Secondary | ICD-10-CM | POA: Diagnosis not present

## 2016-04-15 DIAGNOSIS — Z3200 Encounter for pregnancy test, result unknown: Secondary | ICD-10-CM | POA: Diagnosis not present

## 2016-04-15 DIAGNOSIS — F321 Major depressive disorder, single episode, moderate: Secondary | ICD-10-CM | POA: Diagnosis not present

## 2016-04-23 DIAGNOSIS — Z3A00 Weeks of gestation of pregnancy not specified: Secondary | ICD-10-CM | POA: Diagnosis not present

## 2016-04-23 DIAGNOSIS — O00109 Unspecified tubal pregnancy without intrauterine pregnancy: Secondary | ICD-10-CM | POA: Diagnosis not present

## 2016-05-02 ENCOUNTER — Encounter: Payer: Self-pay | Admitting: Adult Health

## 2016-05-02 ENCOUNTER — Ambulatory Visit (INDEPENDENT_AMBULATORY_CARE_PROVIDER_SITE_OTHER): Payer: BLUE CROSS/BLUE SHIELD | Admitting: Adult Health

## 2016-05-02 VITALS — BP 128/70 | Temp 98.6°F | Ht 64.0 in | Wt 169.4 lb

## 2016-05-02 DIAGNOSIS — F419 Anxiety disorder, unspecified: Principal | ICD-10-CM

## 2016-05-02 DIAGNOSIS — F32A Depression, unspecified: Secondary | ICD-10-CM

## 2016-05-02 DIAGNOSIS — F418 Other specified anxiety disorders: Secondary | ICD-10-CM

## 2016-05-02 DIAGNOSIS — F329 Major depressive disorder, single episode, unspecified: Secondary | ICD-10-CM

## 2016-05-02 MED ORDER — CITALOPRAM HYDROBROMIDE 20 MG PO TABS: 20.0000 mg | ORAL_TABLET | Freq: Every day | ORAL | 3 refills | Status: DC

## 2016-05-02 NOTE — Patient Instructions (Signed)
I was prescribed a medication called
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
## 2016-05-08 DIAGNOSIS — O009 Unspecified ectopic pregnancy without intrauterine pregnancy: Secondary | ICD-10-CM | POA: Diagnosis not present

## 2016-05-08 DIAGNOSIS — M9902 Segmental and somatic dysfunction of thoracic region: Secondary | ICD-10-CM | POA: Diagnosis not present

## 2016-05-08 DIAGNOSIS — M9905 Segmental and somatic dysfunction of pelvic region: Secondary | ICD-10-CM | POA: Diagnosis not present

## 2016-05-08 DIAGNOSIS — M791 Myalgia: Secondary | ICD-10-CM | POA: Diagnosis not present

## 2016-05-08 DIAGNOSIS — M9903 Segmental and somatic dysfunction of lumbar region: Secondary | ICD-10-CM | POA: Diagnosis not present

## 2016-05-08 DIAGNOSIS — Z3A00 Weeks of gestation of pregnancy not specified: Secondary | ICD-10-CM | POA: Diagnosis not present

## 2016-05-10 DIAGNOSIS — F411 Generalized anxiety disorder: Secondary | ICD-10-CM | POA: Diagnosis not present

## 2016-05-10 DIAGNOSIS — F321 Major depressive disorder, single episode, moderate: Secondary | ICD-10-CM | POA: Diagnosis not present

## 2016-05-13 DIAGNOSIS — M6281 Muscle weakness (generalized): Secondary | ICD-10-CM | POA: Diagnosis not present

## 2016-05-13 DIAGNOSIS — G5602 Carpal tunnel syndrome, left upper limb: Secondary | ICD-10-CM | POA: Diagnosis not present

## 2016-05-13 DIAGNOSIS — R201 Hypoesthesia of skin: Secondary | ICD-10-CM | POA: Diagnosis not present

## 2016-05-13 DIAGNOSIS — M542 Cervicalgia: Secondary | ICD-10-CM | POA: Diagnosis not present

## 2016-05-13 DIAGNOSIS — M5417 Radiculopathy, lumbosacral region: Secondary | ICD-10-CM | POA: Diagnosis not present

## 2016-05-15 DIAGNOSIS — M5412 Radiculopathy, cervical region: Secondary | ICD-10-CM | POA: Diagnosis not present

## 2016-05-15 DIAGNOSIS — G2581 Restless legs syndrome: Secondary | ICD-10-CM | POA: Diagnosis not present

## 2016-05-15 DIAGNOSIS — M542 Cervicalgia: Secondary | ICD-10-CM | POA: Diagnosis not present

## 2016-05-15 DIAGNOSIS — R201 Hypoesthesia of skin: Secondary | ICD-10-CM | POA: Diagnosis not present

## 2016-05-15 DIAGNOSIS — R202 Paresthesia of skin: Secondary | ICD-10-CM | POA: Diagnosis not present

## 2016-05-15 DIAGNOSIS — G5603 Carpal tunnel syndrome, bilateral upper limbs: Secondary | ICD-10-CM | POA: Diagnosis not present

## 2016-05-15 DIAGNOSIS — M5417 Radiculopathy, lumbosacral region: Secondary | ICD-10-CM | POA: Diagnosis not present

## 2016-05-29 DIAGNOSIS — F411 Generalized anxiety disorder: Secondary | ICD-10-CM | POA: Diagnosis not present

## 2016-05-29 DIAGNOSIS — F321 Major depressive disorder, single episode, moderate: Secondary | ICD-10-CM | POA: Diagnosis not present

## 2016-06-17 DIAGNOSIS — F321 Major depressive disorder, single episode, moderate: Secondary | ICD-10-CM | POA: Diagnosis not present

## 2016-06-17 DIAGNOSIS — F411 Generalized anxiety disorder: Secondary | ICD-10-CM | POA: Diagnosis not present

## 2016-07-10 ENCOUNTER — Ambulatory Visit (INDEPENDENT_AMBULATORY_CARE_PROVIDER_SITE_OTHER): Payer: BLUE CROSS/BLUE SHIELD | Admitting: Adult Health

## 2016-07-10 ENCOUNTER — Ambulatory Visit: Payer: BLUE CROSS/BLUE SHIELD | Admitting: Adult Health

## 2016-07-10 VITALS — BP 120/78 | Temp 98.3°F | Wt 180.0 lb

## 2016-07-10 DIAGNOSIS — G43711 Chronic migraine without aura, intractable, with status migrainosus: Secondary | ICD-10-CM

## 2016-07-10 DIAGNOSIS — F339 Major depressive disorder, recurrent, unspecified: Secondary | ICD-10-CM | POA: Diagnosis not present

## 2016-07-10 MED ORDER — AMITRIPTYLINE HCL 50 MG PO TABS
50.0000 mg | ORAL_TABLET | Freq: Every day | ORAL | 1 refills | Status: DC
Start: 2016-07-10 — End: 2017-01-29
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
## 2016-07-17 DIAGNOSIS — F321 Major depressive disorder, single episode, moderate: Secondary | ICD-10-CM | POA: Diagnosis not present

## 2016-07-17 DIAGNOSIS — F411 Generalized anxiety disorder: Secondary | ICD-10-CM | POA: Diagnosis not present

## 2016-07-26 DIAGNOSIS — F321 Major depressive disorder, single episode, moderate: Secondary | ICD-10-CM | POA: Diagnosis not present

## 2016-07-26 DIAGNOSIS — F411 Generalized anxiety disorder: Secondary | ICD-10-CM | POA: Diagnosis not present

## 2016-07-30 DIAGNOSIS — M5412 Radiculopathy, cervical region: Secondary | ICD-10-CM | POA: Diagnosis not present

## 2016-07-30 DIAGNOSIS — M5417 Radiculopathy, lumbosacral region: Secondary | ICD-10-CM | POA: Diagnosis not present

## 2016-07-30 DIAGNOSIS — G5602 Carpal tunnel syndrome, left upper limb: Secondary | ICD-10-CM | POA: Diagnosis not present

## 2016-07-30 DIAGNOSIS — G2581 Restless legs syndrome: Secondary | ICD-10-CM | POA: Diagnosis not present

## 2016-07-30 DIAGNOSIS — R202 Paresthesia of skin: Secondary | ICD-10-CM | POA: Diagnosis not present

## 2016-08-04 DIAGNOSIS — F321 Major depressive disorder, single episode, moderate: Secondary | ICD-10-CM | POA: Diagnosis not present

## 2016-08-04 DIAGNOSIS — F411 Generalized anxiety disorder: Secondary | ICD-10-CM | POA: Diagnosis not present

## 2016-08-07 DIAGNOSIS — F411 Generalized anxiety disorder: Secondary | ICD-10-CM | POA: Diagnosis not present

## 2016-08-07 DIAGNOSIS — F321 Major depressive disorder, single episode, moderate: Secondary | ICD-10-CM | POA: Diagnosis not present

## 2016-08-15 ENCOUNTER — Other Ambulatory Visit: Payer: Self-pay | Admitting: Adult Health

## 2016-08-15 DIAGNOSIS — G43711 Chronic migraine without aura, intractable, with status migrainosus: Secondary | ICD-10-CM

## 2016-08-15 DIAGNOSIS — G479 Sleep disorder, unspecified: Secondary | ICD-10-CM

## 2016-08-21 DIAGNOSIS — F321 Major depressive disorder, single episode, moderate: Secondary | ICD-10-CM | POA: Diagnosis not present

## 2016-08-21 DIAGNOSIS — F411 Generalized anxiety disorder: Secondary | ICD-10-CM | POA: Diagnosis not present

## 2016-09-04 DIAGNOSIS — M545 Low back pain: Secondary | ICD-10-CM | POA: Diagnosis not present

## 2016-09-09 DIAGNOSIS — M545 Low back pain: Secondary | ICD-10-CM | POA: Diagnosis not present

## 2016-09-18 DIAGNOSIS — F321 Major depressive disorder, single episode, moderate: Secondary | ICD-10-CM | POA: Diagnosis not present

## 2016-09-18 DIAGNOSIS — M545 Low back pain: Secondary | ICD-10-CM | POA: Diagnosis not present

## 2016-09-18 DIAGNOSIS — F411 Generalized anxiety disorder: Secondary | ICD-10-CM | POA: Diagnosis not present

## 2016-09-23 DIAGNOSIS — M9905 Segmental and somatic dysfunction of pelvic region: Secondary | ICD-10-CM | POA: Diagnosis not present

## 2016-09-23 DIAGNOSIS — M545 Low back pain: Secondary | ICD-10-CM | POA: Diagnosis not present

## 2016-09-23 DIAGNOSIS — M5431 Sciatica, right side: Secondary | ICD-10-CM | POA: Diagnosis not present

## 2016-09-23 DIAGNOSIS — M9902 Segmental and somatic dysfunction of thoracic region: Secondary | ICD-10-CM | POA: Diagnosis not present

## 2016-09-23 DIAGNOSIS — M791 Myalgia: Secondary | ICD-10-CM | POA: Diagnosis not present

## 2016-09-23 DIAGNOSIS — M9903 Segmental and somatic dysfunction of lumbar region: Secondary | ICD-10-CM | POA: Diagnosis not present

## 2016-09-23 DIAGNOSIS — M62830 Muscle spasm of back: Secondary | ICD-10-CM | POA: Diagnosis not present

## 2016-10-02 DIAGNOSIS — F321 Major depressive disorder, single episode, moderate: Secondary | ICD-10-CM | POA: Diagnosis not present

## 2016-10-02 DIAGNOSIS — F411 Generalized anxiety disorder: Secondary | ICD-10-CM | POA: Diagnosis not present

## 2016-10-03 ENCOUNTER — Other Ambulatory Visit (HOSPITAL_COMMUNITY)
Admission: RE | Admit: 2016-10-03 | Discharge: 2016-10-03 | Disposition: A | Discharge status: Home / Self Care | Payer: BLUE CROSS/BLUE SHIELD | Source: Ambulatory Visit | Attending: Obstetrics and Gynecology | Admitting: Obstetrics and Gynecology

## 2016-10-03 ENCOUNTER — Other Ambulatory Visit: Payer: Self-pay | Admitting: Obstetrics and Gynecology

## 2016-10-03 DIAGNOSIS — Z13220 Encounter for screening for lipoid disorders: Secondary | ICD-10-CM | POA: Diagnosis not present

## 2016-10-03 DIAGNOSIS — Z1151 Encounter for screening for human papillomavirus (HPV): Secondary | ICD-10-CM | POA: Diagnosis not present

## 2016-10-03 DIAGNOSIS — E282 Polycystic ovarian syndrome: Secondary | ICD-10-CM | POA: Diagnosis not present

## 2016-10-03 DIAGNOSIS — Z01419 Encounter for gynecological examination (general) (routine) without abnormal findings: Secondary | ICD-10-CM | POA: Diagnosis not present

## 2016-10-03 DIAGNOSIS — M9903 Segmental and somatic dysfunction of lumbar region: Secondary | ICD-10-CM | POA: Diagnosis not present

## 2016-10-03 DIAGNOSIS — R635 Abnormal weight gain: Secondary | ICD-10-CM | POA: Diagnosis not present

## 2016-10-03 DIAGNOSIS — M9902 Segmental and somatic dysfunction of thoracic region: Secondary | ICD-10-CM | POA: Diagnosis not present

## 2016-10-03 DIAGNOSIS — M791 Myalgia: Secondary | ICD-10-CM | POA: Diagnosis not present

## 2016-10-03 DIAGNOSIS — M9905 Segmental and somatic dysfunction of pelvic region: Secondary | ICD-10-CM | POA: Diagnosis not present

## 2016-10-03 DIAGNOSIS — M62830 Muscle spasm of back: Secondary | ICD-10-CM | POA: Diagnosis not present

## 2016-10-03 DIAGNOSIS — Z01411 Encounter for gynecological examination (general) (routine) with abnormal findings: Secondary | ICD-10-CM | POA: Diagnosis not present

## 2016-10-03 DIAGNOSIS — M5431 Sciatica, right side: Secondary | ICD-10-CM | POA: Diagnosis not present

## 2016-10-03 DIAGNOSIS — B373 Candidiasis of vulva and vagina: Secondary | ICD-10-CM | POA: Diagnosis not present

## 2016-10-07 LAB — CYTOLOGY - PAP
Diagnosis: NEGATIVE
HPV (WINDOPATH): NOT DETECTED

## 2016-10-08 DIAGNOSIS — M9902 Segmental and somatic dysfunction of thoracic region: Secondary | ICD-10-CM | POA: Diagnosis not present

## 2016-10-08 DIAGNOSIS — M9905 Segmental and somatic dysfunction of pelvic region: Secondary | ICD-10-CM | POA: Diagnosis not present

## 2016-10-08 DIAGNOSIS — M9903 Segmental and somatic dysfunction of lumbar region: Secondary | ICD-10-CM | POA: Diagnosis not present

## 2016-10-08 DIAGNOSIS — M791 Myalgia: Secondary | ICD-10-CM | POA: Diagnosis not present

## 2016-10-08 DIAGNOSIS — M62830 Muscle spasm of back: Secondary | ICD-10-CM | POA: Diagnosis not present

## 2016-10-08 DIAGNOSIS — M5431 Sciatica, right side: Secondary | ICD-10-CM | POA: Diagnosis not present

## 2016-10-16 DIAGNOSIS — F321 Major depressive disorder, single episode, moderate: Secondary | ICD-10-CM | POA: Diagnosis not present

## 2016-10-16 DIAGNOSIS — F411 Generalized anxiety disorder: Secondary | ICD-10-CM | POA: Diagnosis not present

## 2016-10-17 DIAGNOSIS — M545 Low back pain: Secondary | ICD-10-CM | POA: Diagnosis not present

## 2016-10-21 DIAGNOSIS — M791 Myalgia: Secondary | ICD-10-CM | POA: Diagnosis not present

## 2016-10-21 DIAGNOSIS — M62830 Muscle spasm of back: Secondary | ICD-10-CM | POA: Diagnosis not present

## 2016-10-21 DIAGNOSIS — M9902 Segmental and somatic dysfunction of thoracic region: Secondary | ICD-10-CM | POA: Diagnosis not present

## 2016-10-21 DIAGNOSIS — M9903 Segmental and somatic dysfunction of lumbar region: Secondary | ICD-10-CM | POA: Diagnosis not present

## 2016-10-21 DIAGNOSIS — M9905 Segmental and somatic dysfunction of pelvic region: Secondary | ICD-10-CM | POA: Diagnosis not present

## 2016-10-21 DIAGNOSIS — M5431 Sciatica, right side: Secondary | ICD-10-CM | POA: Diagnosis not present

## 2016-10-24 DIAGNOSIS — M545 Low back pain: Secondary | ICD-10-CM | POA: Diagnosis not present

## 2016-10-27 DIAGNOSIS — M62830 Muscle spasm of back: Secondary | ICD-10-CM | POA: Diagnosis not present

## 2016-10-27 DIAGNOSIS — M9903 Segmental and somatic dysfunction of lumbar region: Secondary | ICD-10-CM | POA: Diagnosis not present

## 2016-10-27 DIAGNOSIS — M791 Myalgia: Secondary | ICD-10-CM | POA: Diagnosis not present

## 2016-10-27 DIAGNOSIS — M5431 Sciatica, right side: Secondary | ICD-10-CM | POA: Diagnosis not present

## 2016-10-27 DIAGNOSIS — M9905 Segmental and somatic dysfunction of pelvic region: Secondary | ICD-10-CM | POA: Diagnosis not present

## 2016-10-27 DIAGNOSIS — M9902 Segmental and somatic dysfunction of thoracic region: Secondary | ICD-10-CM | POA: Diagnosis not present

## 2016-11-12 DIAGNOSIS — M791 Myalgia: Secondary | ICD-10-CM | POA: Diagnosis not present

## 2016-11-12 DIAGNOSIS — M9902 Segmental and somatic dysfunction of thoracic region: Secondary | ICD-10-CM | POA: Diagnosis not present

## 2016-11-12 DIAGNOSIS — M5431 Sciatica, right side: Secondary | ICD-10-CM | POA: Diagnosis not present

## 2016-11-12 DIAGNOSIS — M9905 Segmental and somatic dysfunction of pelvic region: Secondary | ICD-10-CM | POA: Diagnosis not present

## 2016-11-12 DIAGNOSIS — M9903 Segmental and somatic dysfunction of lumbar region: Secondary | ICD-10-CM | POA: Diagnosis not present

## 2016-11-12 DIAGNOSIS — M62830 Muscle spasm of back: Secondary | ICD-10-CM | POA: Diagnosis not present

## 2016-11-13 DIAGNOSIS — F321 Major depressive disorder, single episode, moderate: Secondary | ICD-10-CM | POA: Diagnosis not present

## 2016-11-13 DIAGNOSIS — F411 Generalized anxiety disorder: Secondary | ICD-10-CM | POA: Diagnosis not present

## 2016-12-18 DIAGNOSIS — G2581 Restless legs syndrome: Secondary | ICD-10-CM | POA: Diagnosis not present

## 2016-12-18 DIAGNOSIS — G5602 Carpal tunnel syndrome, left upper limb: Secondary | ICD-10-CM | POA: Diagnosis not present

## 2016-12-18 DIAGNOSIS — R51 Headache: Secondary | ICD-10-CM | POA: Diagnosis not present

## 2016-12-18 DIAGNOSIS — M5417 Radiculopathy, lumbosacral region: Secondary | ICD-10-CM | POA: Diagnosis not present

## 2016-12-25 DIAGNOSIS — F321 Major depressive disorder, single episode, moderate: Secondary | ICD-10-CM | POA: Diagnosis not present

## 2016-12-25 DIAGNOSIS — F411 Generalized anxiety disorder: Secondary | ICD-10-CM | POA: Diagnosis not present

## 2017-01-14 DIAGNOSIS — R1030 Lower abdominal pain, unspecified: Secondary | ICD-10-CM | POA: Diagnosis not present

## 2017-01-14 DIAGNOSIS — R631 Polydipsia: Secondary | ICD-10-CM | POA: Diagnosis not present

## 2017-01-14 DIAGNOSIS — R350 Frequency of micturition: Secondary | ICD-10-CM | POA: Diagnosis not present

## 2017-01-14 DIAGNOSIS — N915 Oligomenorrhea, unspecified: Secondary | ICD-10-CM | POA: Diagnosis not present

## 2017-01-14 DIAGNOSIS — Z3202 Encounter for pregnancy test, result negative: Secondary | ICD-10-CM | POA: Diagnosis not present

## 2017-01-22 DIAGNOSIS — F411 Generalized anxiety disorder: Secondary | ICD-10-CM | POA: Diagnosis not present

## 2017-01-22 DIAGNOSIS — F321 Major depressive disorder, single episode, moderate: Secondary | ICD-10-CM | POA: Diagnosis not present

## 2017-01-29 ENCOUNTER — Other Ambulatory Visit: Payer: Self-pay | Admitting: Adult Health

## 2017-01-29 DIAGNOSIS — G43711 Chronic migraine without aura, intractable, with status migrainosus: Secondary | ICD-10-CM

## 2017-02-10 ENCOUNTER — Ambulatory Visit (INDEPENDENT_AMBULATORY_CARE_PROVIDER_SITE_OTHER): Payer: BLUE CROSS/BLUE SHIELD | Admitting: Adult Health

## 2017-02-10 ENCOUNTER — Ambulatory Visit: Payer: BLUE CROSS/BLUE SHIELD | Admitting: Adult Health

## 2017-02-10 ENCOUNTER — Encounter: Payer: Self-pay | Admitting: Adult Health

## 2017-02-10 VITALS — BP 130/70 | Temp 98.8°F | Ht 64.0 in | Wt 182.0 lb

## 2017-02-10 DIAGNOSIS — Z5189 Encounter for other specified aftercare: Secondary | ICD-10-CM | POA: Diagnosis not present

## 2017-02-10 DIAGNOSIS — M25562 Pain in left knee: Secondary | ICD-10-CM

## 2017-02-10 DIAGNOSIS — F411 Generalized anxiety disorder: Secondary | ICD-10-CM | POA: Diagnosis not present

## 2017-02-10 MED ORDER — IBUPROFEN 600 MG PO TABS: 600.0000 mg | ORAL_TABLET | Freq: Three times a day (TID) | ORAL | 0 refills | Status: DC | PRN
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
## 2017-02-17 ENCOUNTER — Encounter: Payer: Self-pay | Admitting: Adult Health

## 2017-02-17 ENCOUNTER — Ambulatory Visit (INDEPENDENT_AMBULATORY_CARE_PROVIDER_SITE_OTHER): Payer: BLUE CROSS/BLUE SHIELD | Admitting: Adult Health

## 2017-02-17 VITALS — BP 126/80 | Temp 97.7°F | Wt 184.0 lb

## 2017-02-17 DIAGNOSIS — T07XXXA Unspecified multiple injuries, initial encounter: Secondary | ICD-10-CM

## 2017-02-17 NOTE — Progress Notes (Signed)
Subjective:    Patient ID: Nancy Hunter, female    DOB: 04-16-1991, 26 y.o.   MRN: [id number]  HPI  26 year old female who  has a past medical history of Chronic back pain and PCOS (polycystic ovarian syndrome). She presents to the office today for wound check of abrasions on bilateral knees. She was worried about infection.   She has been using neosporin at night.  Reports 2-3 days of " greenish yellow drainage"   Continues to report left knee pain but feels as though this is improving. She is no longer using crutches.    Review of Systems See HPI   Past Medical History:  Diagnosis Date  . Chronic back pain   . PCOS (polycystic ovarian syndrome)     Social History   Social History  . Marital status: Single    Spouse name: N/A  . Number of children: N/A  . Years of education: N/A   Occupational History  . Not on file.   Social History Main Topics  . Smoking status: Never Smoker  . Smokeless tobacco: Never Used  . Alcohol use Yes     Comment: once a month  . Drug use: No  . Sexual activity: Yes    Birth control/ protection: None   Other Topics Concern  . 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  Family History  Problem Relation Age of Onset  . Hypertension Maternal Grandmother   . Hyperlipidemia Maternal Grandmother   . Arthritis Maternal Grandmother   . Diabetes Maternal Grandfather   . Hypertension Maternal Grandfather     No Known Allergies  Current Outpatient Prescriptions on File Prior to Visit  Medication Sig Dispense Refill  . amitriptyline (ELAVIL) 50 MG tablet TAKE 1 TABLET(50 MG) BY MOUTH AT BEDTIME 90 tablet 1  . citalopram (CELEXA) 20 MG tablet Take 1 tablet (20 mg total) by mouth daily. 30 tablet 3  . ibuprofen (ADVIL,MOTRIN) 600 MG tablet Take 1 tablet (600 mg total) by mouth every 8 (eight) hours as needed. 30 tablet 0   No current facility-administered  medications on file prior to visit.     BP 126/80 (BP Location: Left Arm)   Temp 97.7 F (36.5 C) (Oral)   Wt 184 lb (83.5 kg)   BMI 31.58 kg/m       Objective:   Physical Exam  Constitutional: She is oriented to person, place, and time. She appears well-developed and well-nourished. No distress.  Cardiovascular: Normal rate, normal heart sounds and intact distal pulses.  Exam reveals no gallop and no friction rub.   No murmur heard. Pulmonary/Chest: Effort normal and breath sounds normal. No respiratory distress. She has no wheezes. She has no rales. She exhibits no tenderness.  Neurological: She is alert and oriented to person, place, and time.  Skin: Skin is warm and dry. No rash noted. She is not diaphoretic. No erythema. No pallor.  Well healing abrasions to bilateral knees. No signs of infection noted. Healthy granulation tissue noted to bilateral knees   Decreased swelling to left knee     Psychiatric: She has a normal mood and affect. Her behavior is normal. Judgment and thought content normal.  Nursing note and vitals reviewed.     Assessment & Plan:  1. Multiple abrasions - Continue current treatment plan  - Follow up with signs  of infection   Shirline Frees, NP

## 2017-03-06 DIAGNOSIS — N915 Oligomenorrhea, unspecified: Secondary | ICD-10-CM | POA: Diagnosis not present

## 2017-03-10 ENCOUNTER — Ambulatory Visit: Payer: BLUE CROSS/BLUE SHIELD | Admitting: Adult Health

## 2017-03-10 DIAGNOSIS — L700 Acne vulgaris: Secondary | ICD-10-CM | POA: Diagnosis not present

## 2017-03-10 DIAGNOSIS — L218 Other seborrheic dermatitis: Secondary | ICD-10-CM | POA: Diagnosis not present

## 2017-03-10 DIAGNOSIS — L309 Dermatitis, unspecified: Secondary | ICD-10-CM | POA: Diagnosis not present

## 2017-05-07 DIAGNOSIS — G5602 Carpal tunnel syndrome, left upper limb: Secondary | ICD-10-CM | POA: Diagnosis not present

## 2017-05-07 DIAGNOSIS — R201 Hypoesthesia of skin: Secondary | ICD-10-CM | POA: Diagnosis not present

## 2017-05-07 DIAGNOSIS — M5417 Radiculopathy, lumbosacral region: Secondary | ICD-10-CM | POA: Diagnosis not present

## 2017-05-07 DIAGNOSIS — R202 Paresthesia of skin: Secondary | ICD-10-CM | POA: Diagnosis not present

## 2017-05-19 NOTE — L&D Delivery Note (Signed)
Delivery Note Intrapartum, pt developed fever to 102.  Fetal tachycardia to 180s-200s noted.  Tylenol and Unasyn started.  At time of delivery, baseline was back down to 150s.  At 2:25 AM a viable female was delivered via Vaginal, Spontaneous (Presentation: LOA ).  APGAR: 8, 9; weight pending .   Placenta status: spontaneous, intact .  Cord: with the following complications: tight nuchal cord, surgically reduced .  Cord pH: Sent   Anesthesia:  Epidural Episiotomy:  None Lacerations: Bilateral Labial;Vaginal Suture Repair: 3.0 chromic Est. Blood Loss (mL): 392  Mom to postpartum.  Baby to Couplet care / Skin to Skin.  Nancy Hunter 05/18/2018, 2:52 AM

## 2017-05-21 DIAGNOSIS — M5412 Radiculopathy, cervical region: Secondary | ICD-10-CM | POA: Diagnosis not present

## 2017-05-21 DIAGNOSIS — R201 Hypoesthesia of skin: Secondary | ICD-10-CM | POA: Diagnosis not present

## 2017-05-21 DIAGNOSIS — G5603 Carpal tunnel syndrome, bilateral upper limbs: Secondary | ICD-10-CM | POA: Diagnosis not present

## 2017-05-21 DIAGNOSIS — M5417 Radiculopathy, lumbosacral region: Secondary | ICD-10-CM | POA: Diagnosis not present

## 2017-05-21 DIAGNOSIS — R51 Headache: Secondary | ICD-10-CM | POA: Diagnosis not present

## 2017-05-21 DIAGNOSIS — R202 Paresthesia of skin: Secondary | ICD-10-CM | POA: Diagnosis not present

## 2017-05-21 DIAGNOSIS — G2581 Restless legs syndrome: Secondary | ICD-10-CM | POA: Diagnosis not present

## 2017-06-16 DIAGNOSIS — M4307 Spondylolysis, lumbosacral region: Secondary | ICD-10-CM | POA: Diagnosis not present

## 2017-06-16 DIAGNOSIS — M5440 Lumbago with sciatica, unspecified side: Secondary | ICD-10-CM | POA: Diagnosis not present

## 2017-07-13 DIAGNOSIS — R200 Anesthesia of skin: Secondary | ICD-10-CM | POA: Diagnosis not present

## 2017-07-13 DIAGNOSIS — R202 Paresthesia of skin: Secondary | ICD-10-CM | POA: Diagnosis not present

## 2017-07-13 DIAGNOSIS — M549 Dorsalgia, unspecified: Secondary | ICD-10-CM | POA: Diagnosis not present

## 2017-08-06 DIAGNOSIS — E288 Other ovarian dysfunction: Secondary | ICD-10-CM | POA: Diagnosis not present

## 2017-08-06 DIAGNOSIS — N926 Irregular menstruation, unspecified: Secondary | ICD-10-CM | POA: Diagnosis not present

## 2017-08-14 DIAGNOSIS — Z319 Encounter for procreative management, unspecified: Secondary | ICD-10-CM | POA: Diagnosis not present

## 2017-08-21 DIAGNOSIS — Z319 Encounter for procreative management, unspecified: Secondary | ICD-10-CM | POA: Diagnosis not present

## 2017-08-28 DIAGNOSIS — E288 Other ovarian dysfunction: Secondary | ICD-10-CM | POA: Diagnosis not present

## 2017-08-28 DIAGNOSIS — Z319 Encounter for procreative management, unspecified: Secondary | ICD-10-CM | POA: Diagnosis not present

## 2017-09-10 DIAGNOSIS — Z3201 Encounter for pregnancy test, result positive: Secondary | ICD-10-CM | POA: Diagnosis not present

## 2017-09-10 DIAGNOSIS — Z3200 Encounter for pregnancy test, result unknown: Secondary | ICD-10-CM | POA: Diagnosis not present

## 2017-09-16 ENCOUNTER — Telehealth: Payer: Self-pay | Admitting: Adult Health

## 2017-09-16 DIAGNOSIS — Z3200 Encounter for pregnancy test, result unknown: Secondary | ICD-10-CM | POA: Diagnosis not present

## 2017-09-16 NOTE — Telephone Encounter (Signed)
Noted  

## 2017-09-16 NOTE — Telephone Encounter (Signed)
Copied from CRM [id number]. Topic: General - Other >> Sep 16, 2017 10:59 AM Maia Petties wrote: Pt was signed up for nurse case mgmt thru [facility] for back pain and pregnancy eating plan.

## 2017-09-21 ENCOUNTER — Other Ambulatory Visit: Payer: Self-pay | Admitting: Adult Health

## 2017-09-21 DIAGNOSIS — G43711 Chronic migraine without aura, intractable, with status migrainosus: Secondary | ICD-10-CM

## 2017-09-22 NOTE — Telephone Encounter (Signed)
DENIED.  PT NEEDS AN OFFICE VISIT.  MESSAGE SENT TO THE PHARMACY.

## 2017-09-29 DIAGNOSIS — M9901 Segmental and somatic dysfunction of cervical region: Secondary | ICD-10-CM | POA: Diagnosis not present

## 2017-09-29 DIAGNOSIS — M25512 Pain in left shoulder: Secondary | ICD-10-CM | POA: Diagnosis not present

## 2017-09-29 DIAGNOSIS — M9903 Segmental and somatic dysfunction of lumbar region: Secondary | ICD-10-CM | POA: Diagnosis not present

## 2017-09-29 DIAGNOSIS — O0900 Supervision of pregnancy with history of infertility, unspecified trimester: Secondary | ICD-10-CM | POA: Diagnosis not present

## 2017-09-29 DIAGNOSIS — M9902 Segmental and somatic dysfunction of thoracic region: Secondary | ICD-10-CM | POA: Diagnosis not present

## 2017-10-01 DIAGNOSIS — M25512 Pain in left shoulder: Secondary | ICD-10-CM | POA: Diagnosis not present

## 2017-10-01 DIAGNOSIS — M9901 Segmental and somatic dysfunction of cervical region: Secondary | ICD-10-CM | POA: Diagnosis not present

## 2017-10-01 DIAGNOSIS — M9902 Segmental and somatic dysfunction of thoracic region: Secondary | ICD-10-CM | POA: Diagnosis not present

## 2017-10-01 DIAGNOSIS — M9903 Segmental and somatic dysfunction of lumbar region: Secondary | ICD-10-CM | POA: Diagnosis not present

## 2017-10-06 DIAGNOSIS — M25512 Pain in left shoulder: Secondary | ICD-10-CM | POA: Diagnosis not present

## 2017-10-06 DIAGNOSIS — M9903 Segmental and somatic dysfunction of lumbar region: Secondary | ICD-10-CM | POA: Diagnosis not present

## 2017-10-06 DIAGNOSIS — M9901 Segmental and somatic dysfunction of cervical region: Secondary | ICD-10-CM | POA: Diagnosis not present

## 2017-10-06 DIAGNOSIS — M9902 Segmental and somatic dysfunction of thoracic region: Secondary | ICD-10-CM | POA: Diagnosis not present

## 2017-10-13 DIAGNOSIS — E282 Polycystic ovarian syndrome: Secondary | ICD-10-CM | POA: Diagnosis not present

## 2017-10-13 DIAGNOSIS — M9901 Segmental and somatic dysfunction of cervical region: Secondary | ICD-10-CM | POA: Diagnosis not present

## 2017-10-13 DIAGNOSIS — M9903 Segmental and somatic dysfunction of lumbar region: Secondary | ICD-10-CM | POA: Diagnosis not present

## 2017-10-13 DIAGNOSIS — M25512 Pain in left shoulder: Secondary | ICD-10-CM | POA: Diagnosis not present

## 2017-10-13 DIAGNOSIS — M9902 Segmental and somatic dysfunction of thoracic region: Secondary | ICD-10-CM | POA: Diagnosis not present

## 2017-10-13 DIAGNOSIS — Z3481 Encounter for supervision of other normal pregnancy, first trimester: Secondary | ICD-10-CM | POA: Diagnosis not present

## 2017-10-13 LAB — OB RESULTS CONSOLE HGB/HCT, BLOOD: Hemoglobin: 10.7

## 2017-10-13 LAB — OB RESULTS CONSOLE RUBELLA ANTIBODY, IGM: Rubella: IMMUNE

## 2017-10-13 LAB — OB RESULTS CONSOLE HIV ANTIBODY (ROUTINE TESTING): HIV: NONREACTIVE

## 2017-10-13 LAB — OB RESULTS CONSOLE RPR: RPR: NONREACTIVE

## 2017-10-15 DIAGNOSIS — M25512 Pain in left shoulder: Secondary | ICD-10-CM | POA: Diagnosis not present

## 2017-10-15 DIAGNOSIS — M9902 Segmental and somatic dysfunction of thoracic region: Secondary | ICD-10-CM | POA: Diagnosis not present

## 2017-10-15 DIAGNOSIS — O09291 Supervision of pregnancy with other poor reproductive or obstetric history, first trimester: Secondary | ICD-10-CM | POA: Diagnosis not present

## 2017-10-15 DIAGNOSIS — M9901 Segmental and somatic dysfunction of cervical region: Secondary | ICD-10-CM | POA: Diagnosis not present

## 2017-10-15 DIAGNOSIS — M9903 Segmental and somatic dysfunction of lumbar region: Secondary | ICD-10-CM | POA: Diagnosis not present

## 2017-10-30 DIAGNOSIS — Z3481 Encounter for supervision of other normal pregnancy, first trimester: Secondary | ICD-10-CM | POA: Diagnosis not present

## 2017-10-30 LAB — OB RESULTS CONSOLE GC/CHLAMYDIA
Chlamydia: NEGATIVE
Gonorrhea: NEGATIVE

## 2017-11-05 DIAGNOSIS — M9902 Segmental and somatic dysfunction of thoracic region: Secondary | ICD-10-CM | POA: Diagnosis not present

## 2017-11-05 DIAGNOSIS — M25512 Pain in left shoulder: Secondary | ICD-10-CM | POA: Diagnosis not present

## 2017-11-05 DIAGNOSIS — M9903 Segmental and somatic dysfunction of lumbar region: Secondary | ICD-10-CM | POA: Diagnosis not present

## 2017-11-05 DIAGNOSIS — M9901 Segmental and somatic dysfunction of cervical region: Secondary | ICD-10-CM | POA: Diagnosis not present

## 2017-11-11 DIAGNOSIS — M9903 Segmental and somatic dysfunction of lumbar region: Secondary | ICD-10-CM | POA: Diagnosis not present

## 2017-11-11 DIAGNOSIS — M9901 Segmental and somatic dysfunction of cervical region: Secondary | ICD-10-CM | POA: Diagnosis not present

## 2017-11-11 DIAGNOSIS — M25512 Pain in left shoulder: Secondary | ICD-10-CM | POA: Diagnosis not present

## 2017-11-11 DIAGNOSIS — M9902 Segmental and somatic dysfunction of thoracic region: Secondary | ICD-10-CM | POA: Diagnosis not present

## 2017-11-17 DIAGNOSIS — M25512 Pain in left shoulder: Secondary | ICD-10-CM | POA: Diagnosis not present

## 2017-11-17 DIAGNOSIS — M9902 Segmental and somatic dysfunction of thoracic region: Secondary | ICD-10-CM | POA: Diagnosis not present

## 2017-11-17 DIAGNOSIS — M9901 Segmental and somatic dysfunction of cervical region: Secondary | ICD-10-CM | POA: Diagnosis not present

## 2017-11-17 DIAGNOSIS — M9903 Segmental and somatic dysfunction of lumbar region: Secondary | ICD-10-CM | POA: Diagnosis not present

## 2017-11-24 DIAGNOSIS — M9901 Segmental and somatic dysfunction of cervical region: Secondary | ICD-10-CM | POA: Diagnosis not present

## 2017-11-24 DIAGNOSIS — M9902 Segmental and somatic dysfunction of thoracic region: Secondary | ICD-10-CM | POA: Diagnosis not present

## 2017-11-24 DIAGNOSIS — M9903 Segmental and somatic dysfunction of lumbar region: Secondary | ICD-10-CM | POA: Diagnosis not present

## 2017-11-24 DIAGNOSIS — Z3481 Encounter for supervision of other normal pregnancy, first trimester: Secondary | ICD-10-CM | POA: Diagnosis not present

## 2017-11-24 DIAGNOSIS — M25512 Pain in left shoulder: Secondary | ICD-10-CM | POA: Diagnosis not present

## 2017-11-27 DIAGNOSIS — O99012 Anemia complicating pregnancy, second trimester: Secondary | ICD-10-CM | POA: Diagnosis not present

## 2017-11-27 DIAGNOSIS — Z3482 Encounter for supervision of other normal pregnancy, second trimester: Secondary | ICD-10-CM | POA: Diagnosis not present

## 2017-12-17 DIAGNOSIS — O0902 Supervision of pregnancy with history of infertility, second trimester: Secondary | ICD-10-CM | POA: Diagnosis not present

## 2017-12-17 DIAGNOSIS — O99342 Other mental disorders complicating pregnancy, second trimester: Secondary | ICD-10-CM | POA: Diagnosis not present

## 2017-12-17 DIAGNOSIS — O285 Abnormal chromosomal and genetic finding on antenatal screening of mother: Secondary | ICD-10-CM | POA: Diagnosis not present

## 2017-12-17 DIAGNOSIS — Z315 Encounter for genetic counseling: Secondary | ICD-10-CM | POA: Diagnosis not present

## 2017-12-17 DIAGNOSIS — Z3A18 18 weeks gestation of pregnancy: Secondary | ICD-10-CM | POA: Diagnosis not present

## 2017-12-17 DIAGNOSIS — O281 Abnormal biochemical finding on antenatal screening of mother: Secondary | ICD-10-CM | POA: Diagnosis not present

## 2017-12-22 DIAGNOSIS — M9901 Segmental and somatic dysfunction of cervical region: Secondary | ICD-10-CM | POA: Diagnosis not present

## 2017-12-22 DIAGNOSIS — M9902 Segmental and somatic dysfunction of thoracic region: Secondary | ICD-10-CM | POA: Diagnosis not present

## 2017-12-22 DIAGNOSIS — M9903 Segmental and somatic dysfunction of lumbar region: Secondary | ICD-10-CM | POA: Diagnosis not present

## 2017-12-22 DIAGNOSIS — M25512 Pain in left shoulder: Secondary | ICD-10-CM | POA: Diagnosis not present

## 2018-01-20 DIAGNOSIS — M9903 Segmental and somatic dysfunction of lumbar region: Secondary | ICD-10-CM | POA: Diagnosis not present

## 2018-01-20 DIAGNOSIS — M9902 Segmental and somatic dysfunction of thoracic region: Secondary | ICD-10-CM | POA: Diagnosis not present

## 2018-01-20 DIAGNOSIS — M9901 Segmental and somatic dysfunction of cervical region: Secondary | ICD-10-CM | POA: Diagnosis not present

## 2018-01-20 DIAGNOSIS — M25552 Pain in left hip: Secondary | ICD-10-CM | POA: Diagnosis not present

## 2018-01-20 DIAGNOSIS — F411 Generalized anxiety disorder: Secondary | ICD-10-CM | POA: Diagnosis not present

## 2018-01-26 DIAGNOSIS — M25552 Pain in left hip: Secondary | ICD-10-CM | POA: Diagnosis not present

## 2018-01-26 DIAGNOSIS — Z23 Encounter for immunization: Secondary | ICD-10-CM | POA: Diagnosis not present

## 2018-01-26 DIAGNOSIS — M6281 Muscle weakness (generalized): Secondary | ICD-10-CM | POA: Diagnosis not present

## 2018-01-28 DIAGNOSIS — F411 Generalized anxiety disorder: Secondary | ICD-10-CM | POA: Diagnosis not present

## 2018-01-29 DIAGNOSIS — M25552 Pain in left hip: Secondary | ICD-10-CM | POA: Diagnosis not present

## 2018-01-29 DIAGNOSIS — M6281 Muscle weakness (generalized): Secondary | ICD-10-CM | POA: Diagnosis not present

## 2018-02-03 DIAGNOSIS — M6281 Muscle weakness (generalized): Secondary | ICD-10-CM | POA: Diagnosis not present

## 2018-02-03 DIAGNOSIS — M25552 Pain in left hip: Secondary | ICD-10-CM | POA: Diagnosis not present

## 2018-02-05 DIAGNOSIS — M25552 Pain in left hip: Secondary | ICD-10-CM | POA: Diagnosis not present

## 2018-02-05 DIAGNOSIS — M6281 Muscle weakness (generalized): Secondary | ICD-10-CM | POA: Diagnosis not present

## 2018-02-10 DIAGNOSIS — F411 Generalized anxiety disorder: Secondary | ICD-10-CM | POA: Diagnosis not present

## 2018-02-11 DIAGNOSIS — M9902 Segmental and somatic dysfunction of thoracic region: Secondary | ICD-10-CM | POA: Diagnosis not present

## 2018-02-11 DIAGNOSIS — M25552 Pain in left hip: Secondary | ICD-10-CM | POA: Diagnosis not present

## 2018-02-11 DIAGNOSIS — M9901 Segmental and somatic dysfunction of cervical region: Secondary | ICD-10-CM | POA: Diagnosis not present

## 2018-02-11 DIAGNOSIS — M9903 Segmental and somatic dysfunction of lumbar region: Secondary | ICD-10-CM | POA: Diagnosis not present

## 2018-02-11 DIAGNOSIS — F411 Generalized anxiety disorder: Secondary | ICD-10-CM | POA: Diagnosis not present

## 2018-02-17 DIAGNOSIS — M25552 Pain in left hip: Secondary | ICD-10-CM | POA: Diagnosis not present

## 2018-02-17 DIAGNOSIS — M6281 Muscle weakness (generalized): Secondary | ICD-10-CM | POA: Diagnosis not present

## 2018-02-23 DIAGNOSIS — O289 Unspecified abnormal findings on antenatal screening of mother: Secondary | ICD-10-CM | POA: Diagnosis not present

## 2018-02-23 DIAGNOSIS — Z23 Encounter for immunization: Secondary | ICD-10-CM | POA: Diagnosis not present

## 2018-02-23 DIAGNOSIS — Z3483 Encounter for supervision of other normal pregnancy, third trimester: Secondary | ICD-10-CM | POA: Diagnosis not present

## 2018-03-05 DIAGNOSIS — F411 Generalized anxiety disorder: Secondary | ICD-10-CM | POA: Diagnosis not present

## 2018-03-10 DIAGNOSIS — M6281 Muscle weakness (generalized): Secondary | ICD-10-CM | POA: Diagnosis not present

## 2018-03-10 DIAGNOSIS — F411 Generalized anxiety disorder: Secondary | ICD-10-CM | POA: Diagnosis not present

## 2018-03-10 DIAGNOSIS — M25552 Pain in left hip: Secondary | ICD-10-CM | POA: Diagnosis not present

## 2018-04-08 DIAGNOSIS — O289 Unspecified abnormal findings on antenatal screening of mother: Secondary | ICD-10-CM | POA: Diagnosis not present

## 2018-04-09 DIAGNOSIS — F411 Generalized anxiety disorder: Secondary | ICD-10-CM | POA: Diagnosis not present

## 2018-04-21 DIAGNOSIS — Z3483 Encounter for supervision of other normal pregnancy, third trimester: Secondary | ICD-10-CM | POA: Diagnosis not present

## 2018-04-21 LAB — OB RESULTS CONSOLE GBS: GBS: NEGATIVE

## 2018-04-28 DIAGNOSIS — F411 Generalized anxiety disorder: Secondary | ICD-10-CM | POA: Diagnosis not present

## 2018-05-16 ENCOUNTER — Other Ambulatory Visit: Payer: Self-pay | Admitting: Obstetrics and Gynecology

## 2018-05-17 ENCOUNTER — Other Ambulatory Visit: Payer: Self-pay

## 2018-05-17 ENCOUNTER — Inpatient Hospital Stay (HOSPITAL_COMMUNITY): Payer: BLUE CROSS/BLUE SHIELD | Admitting: Anesthesiology

## 2018-05-17 ENCOUNTER — Inpatient Hospital Stay (HOSPITAL_COMMUNITY)
Admission: AD | Admit: 2018-05-17 | Discharge: 2018-05-20 | DRG: 805 | Disposition: A | Discharge status: Home / Self Care | Payer: BLUE CROSS/BLUE SHIELD | Attending: Obstetrics and Gynecology | Admitting: Obstetrics and Gynecology

## 2018-05-17 ENCOUNTER — Encounter (HOSPITAL_COMMUNITY): Payer: Self-pay | Admitting: *Deleted

## 2018-05-17 DIAGNOSIS — Z3483 Encounter for supervision of other normal pregnancy, third trimester: Secondary | ICD-10-CM | POA: Diagnosis not present

## 2018-05-17 DIAGNOSIS — O0903 Supervision of pregnancy with history of infertility, third trimester: Secondary | ICD-10-CM

## 2018-05-17 DIAGNOSIS — O411230 Chorioamnionitis, third trimester, not applicable or unspecified: Secondary | ICD-10-CM | POA: Diagnosis not present

## 2018-05-17 DIAGNOSIS — D573 Sickle-cell trait: Secondary | ICD-10-CM | POA: Diagnosis present

## 2018-05-17 DIAGNOSIS — Z3A00 Weeks of gestation of pregnancy not specified: Secondary | ICD-10-CM | POA: Diagnosis not present

## 2018-05-17 DIAGNOSIS — Z3A40 40 weeks gestation of pregnancy: Secondary | ICD-10-CM | POA: Diagnosis not present

## 2018-05-17 DIAGNOSIS — O9902 Anemia complicating childbirth: Secondary | ICD-10-CM | POA: Diagnosis present

## 2018-05-17 DIAGNOSIS — K429 Umbilical hernia without obstruction or gangrene: Secondary | ICD-10-CM | POA: Diagnosis not present

## 2018-05-17 DIAGNOSIS — O411290 Chorioamnionitis, unspecified trimester, not applicable or unspecified: Secondary | ICD-10-CM | POA: Diagnosis not present

## 2018-05-17 HISTORY — DX: Anemia, unspecified: D64.9

## 2018-05-17 LAB — CBC
HCT: 31.5 % — ABNORMAL LOW (ref 36.0–46.0)
Hemoglobin: 10.5 g/dL — ABNORMAL LOW (ref 12.0–15.0)
MCH: 26.9 pg (ref 26.0–34.0)
MCHC: 33.3 g/dL (ref 30.0–36.0)
MCV: 80.8 fL (ref 80.0–100.0)
Platelets: 176 10*3/uL (ref 150–400)
RBC: 3.9 MIL/uL (ref 3.87–5.11)
RDW: 13.5 % (ref 11.5–15.5)
WBC: 9.6 10*3/uL (ref 4.0–10.5)
nRBC: 0 % (ref 0.0–0.2)

## 2018-05-17 LAB — ABO/RH: ABO/RH(D): O POS

## 2018-05-17 LAB — TYPE AND SCREEN
ABO/RH(D): O POS
Antibody Screen: NEGATIVE

## 2018-05-17 LAB — RPR: RPR Ser Ql: NONREACTIVE

## 2018-05-17 MED ORDER — PHENYLEPHRINE 40 MCG/ML (10ML) SYRINGE FOR IV PUSH (FOR BLOOD PRESSURE SUPPORT)
80.0000 ug | PREFILLED_SYRINGE | INTRAVENOUS | Status: DC | PRN
  Filled 2018-05-17 (×2): qty 10

## 2018-05-17 MED ORDER — BUTORPHANOL TARTRATE 1 MG/ML IJ SOLN
1.0000 mg | INTRAMUSCULAR | Status: DC | PRN
  Administered 2018-05-17 (×2): 1 mg via INTRAVENOUS
  Filled 2018-05-17 (×2): qty 1

## 2018-05-17 MED ORDER — EPHEDRINE 5 MG/ML INJ
10.0000 mg | INTRAVENOUS | Status: DC | PRN
  Filled 2018-05-17: qty 2

## 2018-05-17 MED ORDER — PROMETHAZINE HCL 25 MG/ML IJ SOLN
12.5000 mg | Freq: Four times a day (QID) | INTRAMUSCULAR | Status: DC | PRN
  Administered 2018-05-17: 12.5 mg via INTRAVENOUS
  Filled 2018-05-17: qty 1

## 2018-05-17 MED ORDER — LIDOCAINE HCL (PF) 1 % IJ SOLN
30.0000 mL | INTRAMUSCULAR | Status: DC | PRN
  Filled 2018-05-17: qty 30

## 2018-05-17 MED ORDER — LACTATED RINGERS IV SOLN: 500.0000 mL | Freq: Once | INTRAVENOUS | Status: DC

## 2018-05-17 MED ORDER — OXYTOCIN BOLUS FROM INFUSION
500.0000 mL | Freq: Once | INTRAVENOUS | Status: AC
  Administered 2018-05-18: 500 mL via INTRAVENOUS

## 2018-05-17 MED ORDER — PHENYLEPHRINE 40 MCG/ML (10ML) SYRINGE FOR IV PUSH (FOR BLOOD PRESSURE SUPPORT): 80.0000 ug | PREFILLED_SYRINGE | INTRAVENOUS | Status: DC | PRN

## 2018-05-17 MED ORDER — LACTATED RINGERS IV SOLN
500.0000 mL | INTRAVENOUS | Status: DC | PRN
  Administered 2018-05-17: 1000 mL via INTRAVENOUS

## 2018-05-17 MED ORDER — ONDANSETRON HCL 4 MG/2ML IJ SOLN: 4.0000 mg | Freq: Four times a day (QID) | INTRAMUSCULAR | Status: DC | PRN

## 2018-05-17 MED ORDER — ZOLPIDEM TARTRATE 5 MG PO TABS: 5.0000 mg | ORAL_TABLET | Freq: Every evening | ORAL | Status: DC | PRN

## 2018-05-17 MED ORDER — TERBUTALINE SULFATE 1 MG/ML IJ SOLN
0.2500 mg | Freq: Once | INTRAMUSCULAR | Status: DC | PRN
  Filled 2018-05-17: qty 1

## 2018-05-17 MED ORDER — FENTANYL 2.5 MCG/ML BUPIVACAINE 1/10 % EPIDURAL INFUSION (WH - ANES)
14.0000 mL/h | INTRAMUSCULAR | Status: DC | PRN
  Administered 2018-05-17 (×2): 14 mL/h via EPIDURAL
  Filled 2018-05-17 (×2): qty 100

## 2018-05-17 MED ORDER — DIPHENHYDRAMINE HCL 50 MG/ML IJ SOLN
12.5000 mg | INTRAMUSCULAR | Status: AC | PRN
  Administered 2018-05-17 (×3): 12.5 mg via INTRAVENOUS
  Filled 2018-05-17: qty 1

## 2018-05-17 MED ORDER — MISOPROSTOL 25 MCG QUARTER TABLET
25.0000 ug | ORAL_TABLET | ORAL | Status: DC | PRN
  Administered 2018-05-17: 25 ug via VAGINAL
  Filled 2018-05-17 (×2): qty 1

## 2018-05-17 MED ORDER — LIDOCAINE HCL (PF) 1 % IJ SOLN
INTRAMUSCULAR | Status: DC | PRN
  Administered 2018-05-17: 5 mL via EPIDURAL

## 2018-05-17 MED ORDER — OXYTOCIN 40 UNITS IN LACTATED RINGERS INFUSION - SIMPLE MED
2.5000 [IU]/h | INTRAVENOUS | Status: DC
  Administered 2018-05-18: 2.5 [IU]/h via INTRAVENOUS

## 2018-05-17 MED ORDER — LACTATED RINGERS IV SOLN
INTRAVENOUS | Status: DC
  Administered 2018-05-17 (×4): via INTRAVENOUS

## 2018-05-17 MED ORDER — LACTATED RINGERS AMNIOINFUSION
INTRAVENOUS | Status: DC
  Administered 2018-05-17: 22:00:00 via INTRAUTERINE
  Filled 2018-05-17 (×2): qty 1000

## 2018-05-17 MED ORDER — EPHEDRINE 5 MG/ML INJ: 10.0000 mg | INTRAVENOUS | Status: DC | PRN

## 2018-05-17 MED ORDER — ACETAMINOPHEN 500 MG PO TABS
1000.0000 mg | ORAL_TABLET | Freq: Four times a day (QID) | ORAL | Status: DC | PRN
  Administered 2018-05-17: 1000 mg via ORAL
  Filled 2018-05-17: qty 2

## 2018-05-17 MED ORDER — SOD CITRATE-CITRIC ACID 500-334 MG/5ML PO SOLN: 30.0000 mL | ORAL | Status: DC | PRN

## 2018-05-17 MED ORDER — TERBUTALINE SULFATE 1 MG/ML IJ SOLN: 0.2500 mg | Freq: Once | INTRAMUSCULAR | Status: DC | PRN

## 2018-05-17 MED ORDER — SODIUM CHLORIDE 0.9 % IV SOLN
3.0000 g | Freq: Four times a day (QID) | INTRAVENOUS | Status: DC
  Administered 2018-05-17: 3 g via INTRAVENOUS
  Filled 2018-05-17 (×3): qty 3

## 2018-05-17 MED ORDER — OXYTOCIN 40 UNITS IN LACTATED RINGERS INFUSION - SIMPLE MED
1.0000 m[IU]/min | INTRAVENOUS | Status: DC
  Administered 2018-05-17: 1 m[IU]/min via INTRAVENOUS
  Filled 2018-05-17: qty 1000

## 2018-05-17 MED ORDER — LACTATED RINGERS IV SOLN
500.0000 mL | Freq: Once | INTRAVENOUS | Status: AC
  Administered 2018-05-17: 1000 mL via INTRAVENOUS

## 2018-05-17 MED ORDER — OXYCODONE-ACETAMINOPHEN 5-325 MG PO TABS: 1.0000 | ORAL_TABLET | ORAL | Status: DC | PRN

## 2018-05-17 MED ORDER — OXYCODONE-ACETAMINOPHEN 5-325 MG PO TABS: 2.0000 | ORAL_TABLET | ORAL | Status: DC | PRN

## 2018-05-17 MED ORDER — ACETAMINOPHEN 325 MG PO TABS: 650.0000 mg | ORAL_TABLET | ORAL | Status: DC | PRN

## 2018-05-17 MED ORDER — HYDROXYZINE HCL 50 MG PO TABS
50.0000 mg | ORAL_TABLET | Freq: Four times a day (QID) | ORAL | Status: DC | PRN
  Filled 2018-05-17: qty 1

## 2018-05-17 MED ORDER — PHENYLEPHRINE 40 MCG/ML (10ML) SYRINGE FOR IV PUSH (FOR BLOOD PRESSURE SUPPORT)
80.0000 ug | PREFILLED_SYRINGE | INTRAVENOUS | Status: DC | PRN
  Filled 2018-05-17: qty 10

## 2018-05-17 NOTE — Anesthesia Preprocedure Evaluation (Signed)
Anesthesia Evaluation  Patient identified by MRN, date of birth, ID band Patient awake    Reviewed: Allergy & Precautions, H&P , NPO status , Patient's Chart, lab work & pertinent test results  Airway Mallampati: II  TM Distance: >3 FB Neck ROM: Full    Dental no notable dental hx. (+) Teeth Intact, Dental Advisory Given   Pulmonary neg pulmonary ROS,    Pulmonary exam normal breath sounds clear to auscultation       Cardiovascular negative cardio ROS Normal cardiovascular exam Rhythm:Regular Rate:Normal     Neuro/Psych  Headaches, negative psych ROS   GI/Hepatic negative GI ROS, Neg liver ROS,   Endo/Other    Renal/GU      Musculoskeletal Hx of chronic back pain   Abdominal   Peds  Hematology  (+) Blood dyscrasia, anemia ,   Anesthesia Other Findings   Reproductive/Obstetrics (+) Pregnancy                             Lab Results  Component Value Date   WBC 9.6 05/17/2018   HGB 10.5 (L) 05/17/2018   HCT 31.5 (L) 05/17/2018   MCV 80.8 05/17/2018   PLT 176 05/17/2018    Anesthesia Physical Anesthesia Plan  ASA: II  Anesthesia Plan: Epidural   Post-op Pain Management:    Induction:   PONV Risk Score and Plan:   Airway Management Planned:   Additional Equipment:   Intra-op Plan:   Post-operative Plan:   Informed Consent: I have reviewed the patients History and Physical, chart, labs and discussed the procedure including the risks, benefits and alternatives for the proposed anesthesia with the patient or authorized representative who has indicated his/her understanding and acceptance.     Plan Discussed with:   Anesthesia Plan Comments:         Anesthesia Quick Evaluation

## 2018-05-17 NOTE — Progress Notes (Signed)
Pt comfortable.  BP 123/77   Pulse 85   Temp 98.6 F (37 C) (Oral)   Resp 18   Ht 5\' 5"  (1.651 m)   Wt 78.9 kg   LMP 08/16/2017   SpO2 100%   BMI 28.96 kg/m    AROM clear IUPC placed due to previous late and early decels. Light meconium.  Dilation: 5 Effacement (%): 90 Cervical Position: Anterior Station: -1 Presentation: Vertex Exam by:: Dr. Dion BodyVarnado  Overall reassuring tracing. Occasional late and variable resolved with position change.  Continue current management.

## 2018-05-17 NOTE — Anesthesia Pain Management Evaluation Note (Signed)
  CRNA Pain Management Visit Note  Patient: Nancy Hunter, 27 y.o., female  "Hello I am a member of the anesthesia team at [facility]. We have an anesthesia team available at all times to provide care throughout the hospital, including epidural management and anesthesia for C-section. I don't know your plan for the delivery whether it a natural birth, water birth, IV sedation, nitrous supplementation, doula or epidural, but we want to meet your pain goals."   1.Was your pain managed to your expectations on prior hospitalizations?   No prior hospitalizations  2.What is your expectation for pain management during this hospitalization?     Epidural  3.How can we help you reach that goal? Epidural at the appropriate time  Record the patient's initial score and the patient's pain goal.   Pain: 3  Pain Goal: 7 [facility] wants you to be able to say your pain was always managed very well.  Ulice BoldDavid Adedayo [facility] 05/17/2018

## 2018-05-17 NOTE — Progress Notes (Signed)
Nancy Hunter is a 27 y.o. G1P0000   Subjective: Pt comfortable s/p epidural.  Objective: BP 118/67   Pulse 76   Temp 98.2 F (36.8 C) (Oral)   Resp 18   Ht 5\' 5"  (1.651 m)   Wt 78.9 kg   SpO2 100%   BMI 28.96 kg/m  No intake/output data recorded. No intake/output data recorded.  FHT:  Category I UC:   Contractions q 1-6  SVE:   Dilation: 5 Effacement (%): 90 Station: -1 Exam by:: Nancy SlipperJane Bailey, Nancy Hunter  Labs: Lab Results  Component Value Date   WBC 9.6 05/17/2018   HGB 10.5 (L) 05/17/2018   HCT 31.5 (L) 05/17/2018   MCV 80.8 05/17/2018   PLT 176 05/17/2018    Assessment / Plan: IOL @ 40 2/7 weeks. H/o Elevated AFP. H/o Infertility, conceived with assisted reproduction. Start Pitocin .   Nancy Hunter 05/17/2018, 2:16 PM

## 2018-05-17 NOTE — Anesthesia Procedure Notes (Signed)
Epidural Patient location during procedure: OB Start time: 05/17/2018 12:42 PM End time: 05/17/2018 12:57 PM  Staffing Anesthesiologist: Trevor IhaHouser, Shanaiya Bene A, MD Performed: anesthesiologist   Preanesthetic Checklist Completed: patient identified, site marked, surgical consent, pre-op evaluation, timeout performed, IV checked, risks and benefits discussed and monitors and equipment checked  Epidural Patient position: sitting Prep: site prepped and draped and DuraPrep Patient monitoring: continuous pulse ox and blood pressure Approach: midline Location: L3-L4 Injection technique: LOR air  Needle:  Needle type: Tuohy  Needle gauge: 17 G Needle length: 9 cm and 9 Needle insertion depth: 5 cm cm Catheter type: closed end flexible Catheter size: 19 Gauge Catheter at skin depth: 10 cm Test dose: negative  Assessment Events: blood not aspirated, injection not painful, no injection resistance, negative IV test and no paresthesia  Additional Notes Patient identified. Risks/Benefits/Options discussed with patient including but not limited to bleeding, infection, nerve damage, paralysis, failed block, incomplete pain control, headache, blood pressure changes, nausea, vomiting, reactions to medication both or allergic, itching and postpartum back pain. Confirmed with bedside nurse the patient's most recent platelet count. Confirmed with patient that they are not currently taking any anticoagulation, have any bleeding history or any family history of bleeding disorders. Patient expressed understanding and wished to proceed. All questions were answered. Sterile technique was used throughout the entire procedure. Please see nursing notes for vital signs. Test dose was given through epidural needle and negative prior to continuing to dose epidural or start infusion. Warning signs of high block given to the patient including shortness of breath, tingling/numbness in hands, complete motor block, or any  concerning symptoms with instructions to call for help. Patient was given instructions on fall risk and not to get out of bed. All questions and concerns addressed with instructions to call with any issues.  Attempt (S) . Patient tolerated procedure well.

## 2018-05-17 NOTE — Progress Notes (Signed)
Pt reports contractions are starting to become painful.  7/10 pain.  Does not want epidural yet. Temp:  [98.3 F (36.8 C)] 98.3 F (36.8 C) (12/30 0047) Pulse Rate:  [74-95] 78 (12/30 0700) Resp:  [18-20] 20 (12/30 0515) BP: (103-139)/(57-79) 103/57 (12/30 0700) Weight:  [78.9 kg] 78.9 kg (12/30 0115)  Gen:  Mild to moderate discomfort Cervix 2/80/-2 Intact bag. Foley bulb placed manually.  Category I tracing. Contractions q 3 min.  A/P IUP @ 40 2/7 weeks IOL s/p Cytotec Foley bulb in place. Give Stadol and Phenergan now for pain. Epidural per pt request. Anticipate SVD.

## 2018-05-17 NOTE — H&P (Signed)
Nancy Hunter is a 27 y.o. female G3 P0020 @ 40 2/7 weeks presenting for IOL. Pt has a h/o infertility for 3+ years.  This pregnancy was conceived through assisted reproduction with Dr. Amedeo GoryYalcinkiyah. PNC with [facility] Dion Body(Sang Blount) has been complicated by anemia of pregnancy, [place] trait and Abnormal AFP.  Pt seen by MFM, ultrasounds normal.  Growth ultrasounds have been reassuring.  OB History   No obstetric history on file.    Past Medical History:  Diagnosis Date  . Chronic back pain   . PCOS (polycystic ovarian syndrome)    No past surgical history on file. Family History: family history includes Arthritis in her maternal grandmother; Diabetes in her maternal grandfather; Hyperlipidemia in her maternal grandmother; Hypertension in her maternal grandfather and maternal grandmother. Social History:  reports that she has never smoked. She has never used smokeless tobacco. She reports current alcohol use. She reports that she does not use drugs.     Maternal Diabetes: No Genetic Screening: Abnorma Maternal Ultrasounds/Referrals: Normal Fetal Ultrasounds or other Referrals:  Referred to Materal Fetal Medicine  Maternal Substance Abuse:  No Significant Maternal Medications:  None Significant Maternal Lab Results:  Lab values include: Group B Strep negative Other Comments:  Elevated AFP.  Ultrasound with MFM was normal.  ROS Maternal Medical History:  Contractions: Perceived severity is mild.    Fetal activity: Perceived fetal activity is normal.    Prenatal Complications - Diabetes: none.      There were no vitals taken for this visit. Maternal Exam:  Abdomen: Fetal presentation: vertex  Introitus: Normal vulva. Pelvis: adequate for delivery.   Cervix: Cervix evaluated by digital exam.     Fetal Exam Fetal Monitor Review: Mode: hand-held doppler probe.      Pt examined on 05/06/18.  Physical Exam  Constitutional: She is oriented to person, place, and time. She appears  well-developed and well-nourished. No distress.  Eyes: EOM are normal.  Neck: Normal range of motion.  Respiratory: Effort normal. No respiratory distress.  GI: There is no abdominal tenderness.  Genitourinary:    Vulva normal.   Musculoskeletal: Normal range of motion.        General: Tenderness and edema present.  Neurological: She is alert and oriented to person, place, and time.  Skin: Skin is warm and dry.  Psychiatric: She has a normal mood and affect.    Prenatal labs: ABO, Rh:   Antibody:   Rubella: Immune (05/28 0000) RPR: Nonreactive (05/28 0000)  HBsAg:    HIV: Non-reactive (05/28 0000)  GBS: Negative (12/04 0000)   Assessment/Plan: IUP @ 40 2/7 weeks Elevated AFP-Normal ultrasound. Anemia of pregnancy. Goodman trait. H/o Infertility-conceived via assisted reproduction.  IOL with Cytotec based on cervical exam (closed) 05/13/18.   Nancy Hunter 05/17/2018, 12:41 AM

## 2018-05-18 ENCOUNTER — Encounter (HOSPITAL_COMMUNITY): Payer: Self-pay | Admitting: *Deleted

## 2018-05-18 LAB — CBC
HCT: 29.2 % — ABNORMAL LOW (ref 36.0–46.0)
Hemoglobin: 9.9 g/dL — ABNORMAL LOW (ref 12.0–15.0)
MCH: 27.7 pg (ref 26.0–34.0)
MCHC: 33.9 g/dL (ref 30.0–36.0)
MCV: 81.8 fL (ref 80.0–100.0)
PLATELETS: 153 10*3/uL (ref 150–400)
RBC: 3.57 MIL/uL — ABNORMAL LOW (ref 3.87–5.11)
RDW: 13.5 % (ref 11.5–15.5)
WBC: 19.4 10*3/uL — ABNORMAL HIGH (ref 4.0–10.5)
nRBC: 0 % (ref 0.0–0.2)

## 2018-05-18 MED ORDER — ACETAMINOPHEN 325 MG PO TABS
650.0000 mg | ORAL_TABLET | ORAL | Status: DC | PRN
  Administered 2018-05-19 – 2018-05-20 (×2): 650 mg via ORAL
  Filled 2018-05-18 (×2): qty 2

## 2018-05-18 MED ORDER — ONDANSETRON HCL 4 MG/2ML IJ SOLN: 4.0000 mg | INTRAMUSCULAR | Status: DC | PRN

## 2018-05-18 MED ORDER — SENNOSIDES-DOCUSATE SODIUM 8.6-50 MG PO TABS
2.0000 | ORAL_TABLET | ORAL | Status: DC
  Administered 2018-05-18 – 2018-05-19 (×2): 2 via ORAL
  Filled 2018-05-18 (×2): qty 2

## 2018-05-18 MED ORDER — BENZOCAINE-MENTHOL 20-0.5 % EX AERO: 1.0000 "application " | INHALATION_SPRAY | CUTANEOUS | Status: DC | PRN

## 2018-05-18 MED ORDER — SIMETHICONE 80 MG PO CHEW: 80.0000 mg | CHEWABLE_TABLET | ORAL | Status: DC | PRN

## 2018-05-18 MED ORDER — METHYLERGONOVINE MALEATE 0.2 MG PO TABS: 0.2000 mg | ORAL_TABLET | ORAL | Status: DC | PRN

## 2018-05-18 MED ORDER — COCONUT OIL OIL
1.0000 "application " | TOPICAL_OIL | Status: DC | PRN
  Administered 2018-05-19: 1 via TOPICAL
  Filled 2018-05-18: qty 120

## 2018-05-18 MED ORDER — SODIUM CHLORIDE 0.9 % IV SOLN
3.0000 g | Freq: Four times a day (QID) | INTRAVENOUS | Status: AC
  Administered 2018-05-18 (×3): 3 g via INTRAVENOUS
  Filled 2018-05-18 (×4): qty 3

## 2018-05-18 MED ORDER — ONDANSETRON HCL 4 MG PO TABS: 4.0000 mg | ORAL_TABLET | ORAL | Status: DC | PRN

## 2018-05-18 MED ORDER — METHYLERGONOVINE MALEATE 0.2 MG/ML IJ SOLN: 0.2000 mg | INTRAMUSCULAR | Status: DC | PRN

## 2018-05-18 MED ORDER — DIPHENHYDRAMINE HCL 25 MG PO CAPS: 25.0000 mg | ORAL_CAPSULE | Freq: Four times a day (QID) | ORAL | Status: DC | PRN

## 2018-05-18 MED ORDER — IBUPROFEN 600 MG PO TABS
600.0000 mg | ORAL_TABLET | Freq: Four times a day (QID) | ORAL | Status: DC
  Administered 2018-05-18 – 2018-05-20 (×10): 600 mg via ORAL
  Filled 2018-05-18 (×10): qty 1

## 2018-05-18 MED ORDER — PRENATAL MULTIVITAMIN CH
1.0000 | ORAL_TABLET | Freq: Every day | ORAL | Status: DC
  Administered 2018-05-18 – 2018-05-20 (×3): 1 via ORAL
  Filled 2018-05-18 (×3): qty 1

## 2018-05-18 MED ORDER — OXYTOCIN 40 UNITS IN LACTATED RINGERS INFUSION - SIMPLE MED: 2.5000 [IU]/h | INTRAVENOUS | Status: DC | PRN

## 2018-05-18 MED ORDER — MAGNESIUM HYDROXIDE 400 MG/5ML PO SUSP: 30.0000 mL | ORAL | Status: DC | PRN

## 2018-05-18 MED ORDER — OXYCODONE HCL 5 MG PO TABS: 10.0000 mg | ORAL_TABLET | ORAL | Status: DC | PRN

## 2018-05-18 MED ORDER — TETANUS-DIPHTH-ACELL PERTUSSIS 5-2.5-18.5 LF-MCG/0.5 IM SUSP: 0.5000 mL | Freq: Once | INTRAMUSCULAR | Status: DC

## 2018-05-18 MED ORDER — DIBUCAINE 1 % RE OINT: 1.0000 "application " | TOPICAL_OINTMENT | RECTAL | Status: DC | PRN

## 2018-05-18 MED ORDER — WITCH HAZEL-GLYCERIN EX PADS: 1.0000 "application " | MEDICATED_PAD | CUTANEOUS | Status: DC | PRN

## 2018-05-18 MED ORDER — ZOLPIDEM TARTRATE 5 MG PO TABS: 5.0000 mg | ORAL_TABLET | Freq: Every evening | ORAL | Status: DC | PRN

## 2018-05-18 MED ORDER — OXYCODONE HCL 5 MG PO TABS
5.0000 mg | ORAL_TABLET | ORAL | Status: DC | PRN
  Administered 2018-05-19 – 2018-05-20 (×2): 5 mg via ORAL
  Filled 2018-05-18 (×2): qty 1

## 2018-05-18 MED ORDER — SODIUM CHLORIDE 0.9 % IV SOLN: INTRAVENOUS | Status: DC | PRN

## 2018-05-18 NOTE — Anesthesia Postprocedure Evaluation (Signed)
Anesthesia Post Note  Patient: Harlon FlorSymone Heinke  Procedure(s) Performed: AN AD HOC LABOR EPIDURAL     Patient location during evaluation: Mother Baby Anesthesia Type: Epidural Level of consciousness: awake and alert and oriented Pain management: satisfactory to patient Vital Signs Assessment: post-procedure vital signs reviewed and stable Respiratory status: respiratory function stable Cardiovascular status: stable Postop Assessment: no headache, no backache, epidural receding, patient able to bend at knees, no signs of nausea or vomiting and adequate PO intake Anesthetic complications: no    Last Vitals:  Vitals:   05/18/18 0445 05/18/18 0545  BP: 136/82 104/89  Pulse: 79 91  Resp: 18 20  Temp: 37.1 C 36.9 C  SpO2: 100% 100%    Last Pain:  Vitals:   05/18/18 0545  TempSrc: Oral  PainSc: 0-No pain   Pain Goal: Patients Stated Pain Goal: 7 (05/17/18 [id number])               Karleen DolphinFUSSELL,Mahoganie Basher

## 2018-05-18 NOTE — Lactation Note (Signed)
This note was copied from a baby's chart. Lactation Consultation Note  Patient Name: Girl Harlon FlorSymone Riggsbee ZOXWR'UToday's Date: 05/18/2018 Reason for consult: Initial assessment;Primapara;Term Breastfeeding consultation services and support information given and reviewed.  Newborn is 10 hours old and going to the breast with ease.  Baby is currently latched well and feeding with rhythmic suck/swallows.  Reviewed basics.  Instructed to feed with any feeding cue.  Encouraged to call out for assist/concerns prn.  Maternal Data Has patient been taught Hand Expression?: Yes Does the patient have breastfeeding experience prior to this delivery?: No  Feeding Feeding Type: Breast Fed  LATCH Score Latch: Grasps breast easily, tongue down, lips flanged, rhythmical sucking.  Audible Swallowing: A few with stimulation  Type of Nipple: Everted at rest and after stimulation  Comfort (Breast/Nipple): Soft / non-tender  Hold (Positioning): Assistance needed to correctly position infant at breast and maintain latch.  LATCH Score: 8  Interventions    Lactation Tools Discussed/Used     Consult Status Consult Status: Follow-up Date: 05/19/18 Follow-up type: In-patient    Huston FoleyMOULDEN, Jernard Reiber S 05/18/2018, 12:32 PM

## 2018-05-18 NOTE — Progress Notes (Signed)
Postpartum day #0, NSVD Maternal fever 102.0 on Unasyn x 24 hours  Subjective Pt without complaints.  Lochia normal, has slowed so significantly.  Pain controlled.  Breast feeding yes  Temp:  [97.5 F (36.4 C)-102.1 F (38.9 C)] 97.5 F (36.4 C) (12/31 1400) Pulse Rate:  [72-146] 73 (12/31 1400) Resp:  [16-20] 18 (12/31 1400) BP: (98-144)/(48-99) 110/62 (12/31 1400) SpO2:  [95 %-100 %] 100 % (12/31 1400)  Gen:  NAD, A&O x 3 Uterine fundus:  No palpated.  RN at bedside informed Lochia normal   CBC    Component Value Date/Time   WBC 19.4 (H) 05/18/2018 0530   RBC 3.57 (L) 05/18/2018 0530   HGB 9.9 (L) 05/18/2018 0530   HGB 10.7 10/13/2017   HCT 29.2 (L) 05/18/2018 0530   PLT 153 05/18/2018 0530   MCV 81.8 05/18/2018 0530   MCH 27.7 05/18/2018 0530   MCHC 33.9 05/18/2018 0530   RDW 13.5 05/18/2018 0530   LYMPHSABS 2.1 01/15/2016 1419   MONOABS 0.8 01/15/2016 1419   EOSABS 0.0 01/15/2016 1419   BASOSABS 0.0 01/15/2016 1419     A/P: S/p SVD doing well. Discontinue antibiotics after 11 pm dosage. Routine postpartum care. Lactation support. Discharge Late POD #1 or POD #2. Dr. Richardson Doppole covering tomorrow starting at 7 am.  Geryl Rankinsvelyn Calum Cormier 05/18/2018, 3:36 PM
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
## 2018-05-19 NOTE — Lactation Note (Signed)
This note was copied from a baby's chart. Lactation Consultation Note  Patient Name: Girl Tayelor Fabien PVVZS'M Date: 05/19/2018 Reason for consult: Follow-up assessment;Primapara;Term Baby is 16 hours old and at a 4% weight loss.  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  She is now using football hold and states it feels better.  RN has provided comfort gels.  Discussed techniques to obtain deep latch.  Encouraged to call for assist prn.  Mom will have family bring her Medela pump to hospital to review how to set up and use.  Maternal Data    Feeding Feeding Type: Breast Fed  LATCH Score Latch: Grasps breast easily, tongue down, lips flanged, rhythmical sucking.  Audible Swallowing: A few with stimulation  Type of Nipple: Everted at rest and after stimulation  Comfort (Breast/Nipple): Filling, red/small blisters or bruises, mild/mod discomfort  Hold (Positioning): Assistance needed to correctly position infant at breast and maintain latch.  LATCH Score: 7  Interventions    Lactation Tools Discussed/Used     Consult Status Consult Status: Follow-up Date: 05/20/18 Follow-up type: In-patient    Huston Foley 05/19/2018, 12:50 PM

## 2018-05-19 NOTE — Progress Notes (Signed)
Post Partum Day 1 Subjective: no complaints, up ad lib and tolerating PO  Objective: Blood pressure 112/68, pulse 89, temperature 97.7 F (36.5 C), temperature source Oral, resp. rate 16, height 5\' 5"  (1.651 m), weight 78.9 kg, last menstrual period 08/16/2017, SpO2 100 %, unknown if currently breastfeeding.  Physical Exam:  General: alert, cooperative and no distress Lochia: appropriate Uterine Fundus: firm Incision: NA DVT Evaluation: No evidence of DVT seen on physical exam.  Recent Labs    05/17/18 0159 05/18/18 0530  HGB 10.5* 9.9*  HCT 31.5* 29.2*    Assessment/Plan: Plan for discharge tomorrow and Breastfeeding  Routine postpartum care    LOS: 2 days   Gerald Leitz 05/19/2018, 12:30 PM

## 2018-05-20 MED ORDER — IBUPROFEN 600 MG PO TABS: 600.0000 mg | ORAL_TABLET | Freq: Four times a day (QID) | ORAL | 0 refills | Status: DC

## 2018-05-20 MED ORDER — POLYSACCHARIDE IRON COMPLEX 150 MG PO CAPS: 150.0000 mg | ORAL_CAPSULE | Freq: Every day | ORAL | 1 refills | Status: DC

## 2018-05-20 NOTE — Lactation Note (Signed)
This note was copied from a baby's chart. Lactation Consultation Note  Patient Name: Nancy Hunter [id number]'U Date: 05/20/2018 Reason for consult: Follow-up assessment;Primapara;Nipple pain/trauma;Term;Maternal endocrine disorder Type of Endocrine Disorder?: PCOS  Visited with P1 Mom of term baby on day of discharge, baby at 5% weight loss.  Mom complaining of sore nipples, using coconut oil and has Comfort Gels.  Baby in crib showing feeding cues.  Offered to assist and assess positioning and latch.  Mom instructed to have a pillow vertically behind her (choosing to do football hold), and pillows and blankets support to provide enough height at breast level.  Mom had not been doing that, and was hunching over.  Taught Mom how to support baby's head and her breast, moving her hand further back on breast.  Reviewed hand expression.  Transitional milk easily expressed.  Baby opening and closing mouth quickly, and Mom bringing baby onto breast too quickly.  Assisted Mom to wait for a wider gape of mouth, and baby easily latched deeply onto breast.  Mom amazed how quickly the pain dissipated.  Baby noted to have rhythmic sucking and regular swallows identified for Mom.  Mom taught to use alternate breast compression during feeding, to increase milk flow.  Mom knows to keep baby STS as much as possible, to increase baby's feedings at the breast.  Engorgement prevention and treatment reviewed.  Mom encouraged to feed baby >8 times per 24 hrs.    Encouraged Mom to call prn for concerns, and is aware of OP lactation support available.   Feeding Feeding Type: Breast Fed  LATCH Score Latch: Grasps breast easily, tongue down, lips flanged, rhythmical sucking.  Audible Swallowing: Spontaneous and intermittent  Type of Nipple: Everted at rest and after stimulation  Comfort (Breast/Nipple): Filling, red/small blisters or bruises, mild/mod discomfort  Hold (Positioning): Assistance needed to correctly  position infant at breast and maintain latch.  LATCH Score: 8  Interventions Interventions: Breast feeding basics reviewed;Assisted with latch;Skin to skin;Breast massage;Hand express;Support pillows;Adjust position;Breast compression;Position options;Coconut oil;Comfort gels  Lactation Tools Discussed/Used Tools: Coconut oil;Comfort gels   Consult Status Consult Status: Complete Date: 05/20/18 Follow-up type: Call as needed    Judee Clara 05/20/2018, 9:04 AM

## 2018-05-20 NOTE — Discharge Summary (Signed)
OB Discharge Summary     Patient Name: Nancy Hunter DOB: [birth year] MRN: [id number]  Date of admission: 05/17/2018 Delivering MD: Geryl RankinsVARNADO, Marlaysia Lenig   Date of discharge: 05/20/2018  Admitting diagnosis: INDUCTION Intrauterine pregnancy: 3262w3d     Secondary diagnosis:  Active Problems:   Vaginal delivery  Additional problems: Maternal Fever, presumed chorioamnionitis.     Discharge diagnosis: Term Pregnancy Delivered                                                                                                Post partum procedures:Unasyn x 24 hours  Augmentation: AROM, Pitocin and Cytotec  Complications: None  Hospital course:  Induction of Labor With Vaginal Delivery   28 y.o. yo [id number] at 7062w3d was admitted to the hospital 05/17/2018 for induction of labor.  Indication for induction: h/o abnormal AFP, h/o infertility.  Patient had an uncomplicated labor course as follows: Membrane Rupture Time/Date: 5:40 PM ,05/17/2018   Intrapartum Procedures: Episiotomy: None [1]                                         Lacerations:  Labial [10];Vaginal [6]  Patient had delivery of a Viable infant.  Information for the patient's newborn:  Elie GoodyDixon, Girl Galilea [[id number]][[id number]]  Delivery Method: Vag-Spont   05/18/2018  Details of delivery can be found in separate delivery note.  Patient had a routine postpartum course. Patient is discharged home 05/20/18.  Physical exam  Vitals:   05/19/18 0515 05/19/18 1409 05/19/18 2223 05/20/18 0552  BP: 112/68 110/66 112/71 116/69  Pulse: 89 92 68 90  Resp: 16 16 16 18   Temp: 97.7 F (36.5 C) 97.9 F (36.6 C) 97.7 F (36.5 C) 98.2 F (36.8 C)  TempSrc: Oral Oral  Oral  SpO2: 100%  99% 94%  Weight:      Height:       General: alert, cooperative and no distress Lochia: appropriate Uterine Fundus: firm Incision: N/A DVT Evaluation: No evidence of DVT seen on physical exam. Calf/Ankle edema is present Labs: Lab Results  Component Value Date   WBC 19.4 (H) 05/18/2018   HGB 9.9 (L) 05/18/2018   HCT 29.2 (L) 05/18/2018   MCV 81.8 05/18/2018   PLT 153 05/18/2018   CMP Latest Ref Rng & Units 01/04/2016  Glucose 65 - 99 mg/dL 85  BUN 7 - 25 mg/dL 15  Creatinine 1.300.50 - 8.651.10 mg/dL 7.840.94  Sodium 696135 - 295146 mmol/L 138  Potassium 3.5 - 5.3 mmol/L 4.2  Chloride 98 - 110 mmol/L 99  CO2 20 - 31 mmol/L 27  Calcium 8.6 - 10.2 mg/dL 28.410.0  Total Protein 6.0 - 8.3 g/dL -  Total Bilirubin 0.2 - 1.2 mg/dL -  Alkaline Phos 39 - 132117 U/L -  AST 0 - 37 U/L -  ALT 0 - 35 U/L -    Discharge instruction: per After Visit Summary and "Baby and Me Booklet".  After visit meds:  Allergies as of 05/20/2018  Reactions   Latex Hives      Medication List    TAKE these medications   ibuprofen 600 MG tablet Commonly known as:  ADVIL,MOTRIN Take 1 tablet (600 mg total) by mouth every 6 (six) hours.   iron polysaccharides 150 MG capsule Commonly known as:  NIFEREX Take 1 capsule (150 mg total) by mouth daily.   prenatal multivitamin Tabs tablet Take 1 tablet by mouth daily at 12 noon.       Diet: routine diet  Activity: Advance as tolerated. Pelvic rest for 6 weeks.   Outpatient follow up:6 weeks Follow up Appt:No future appointments. Follow up Visit:No follow-ups on file.  Postpartum contraception: Discussed.  Newborn Data: Live born female  Birth Weight: 7 lb 1 oz (3204 g) APGAR: 8, 9  Newborn Delivery   Birth date/time:  05/18/2018 02:25:00 Delivery type:  Vaginal, Spontaneous     Baby Feeding: Breast Disposition:home with mother   05/20/2018 Geryl Rankins, MD

## 2018-05-20 NOTE — Discharge Instructions (Signed)

## 2018-07-06 DIAGNOSIS — N898 Other specified noninflammatory disorders of vagina: Secondary | ICD-10-CM | POA: Diagnosis not present

## 2018-09-21 ENCOUNTER — Telehealth: Payer: Self-pay | Admitting: *Deleted

## 2018-09-21 NOTE — Telephone Encounter (Signed)
Copied from CRM [id number]. Topic: Appointment Scheduling - Scheduling Inquiry for Clinic >> Sep 20, 2018  4:34 PM Floria Raveling A wrote: Reason for CRM: Pt called in and would like to make an appt for hand pain.  The right one is worse then the other.    Best number [phone]

## 2018-09-22 ENCOUNTER — Encounter: Payer: Self-pay | Admitting: Adult Health

## 2018-09-22 ENCOUNTER — Ambulatory Visit (INDEPENDENT_AMBULATORY_CARE_PROVIDER_SITE_OTHER): Payer: BLUE CROSS/BLUE SHIELD | Admitting: Adult Health

## 2018-09-22 ENCOUNTER — Other Ambulatory Visit: Payer: Self-pay

## 2018-09-22 DIAGNOSIS — G5603 Carpal tunnel syndrome, bilateral upper limbs: Secondary | ICD-10-CM | POA: Diagnosis not present

## 2018-09-22 MED ORDER — PREDNISONE 10 MG PO TABS: ORAL_TABLET | ORAL | 0 refills | Status: DC

## 2018-09-22 NOTE — Progress Notes (Signed)
Virtual Visit via Video Note  I connected with Nancy Hunter on 09/22/18 at  2:30 PM EDT by a video enabled telemedicine application and verified that I am speaking with the correct person using two identifiers.  Location patient: home Location provider:work or home office Persons participating in the virtual visit: patient, provider  I discussed the limitations of evaluation and management by telemedicine and the availability of in person appointments. The patient expressed understanding and agreed to proceed.   HPI: 28 year old female who is being evaluated today for the complaint of bilateral hand pain x 4-6 months that has been becoming worse.  She reports that she has had carpal tunnel in the past, and during her last trimester of pregnancy her hand pain became worse, R>L.  Symptoms include loss of grip strength, pain on the medial aspect of both wrists that is described as sharp at times.  She does have paresthesia throughout the first 3 fingers that is especially apparent at night.    She has been using wrist splints that she had from previous carpal tunnel flares but she has not noticed any improvement.  Has not been using any over-the-counter medications because she is breast-feeding.  ROS: See pertinent positives and negatives per HPI.  Past Medical History:  Diagnosis Date  . Anemia   . Chronic back pain   . PCOS (polycystic ovarian syndrome)     Past Surgical History:  Procedure Laterality Date  . ECTOPIC PREGNANCY SURGERY  04/09/2016    Family History  Problem Relation Age of Onset  . Hypertension Maternal Grandmother   . Hyperlipidemia Maternal Grandmother   . Arthritis Maternal Grandmother   . Diabetes Maternal Grandfather   . Hypertension Maternal Grandfather      Current Outpatient Medications:  .  ibuprofen (ADVIL,MOTRIN) 600 MG tablet, Take 1 tablet (600 mg total) by mouth every 6 (six) hours., Disp: 30 tablet, Rfl: 0 .  iron polysaccharides (NIFEREX) 150  MG capsule, Take 1 capsule (150 mg total) by mouth daily., Disp: 30 capsule, Rfl: 1 .  predniSONE (DELTASONE) 10 MG tablet, 20 mg ( two pills) x 7 days then 10 mg ( 1 pill) x 7 days, Disp: 21 tablet, Rfl: 0 .  Prenatal Vit-Fe Fumarate-FA (PRENATAL MULTIVITAMIN) TABS tablet, Take 1 tablet by mouth daily at 12 noon., Disp: , Rfl:   EXAM:  VITALS per patient if applicable:  GENERAL: alert, oriented, appears well and in no acute distress  HEENT: atraumatic, conjunttiva clear, no obvious abnormalities on inspection of external nose and ears  NECK: normal movements of the head and neck  LUNGS: on inspection no signs of respiratory distress, breathing rate appears normal, no obvious gross SOB, gasping or wheezing  CV: no obvious cyanosis  MS: moves all visible extremities without noticeable abnormality  PSYCH/NEURO: pleasant and cooperative, no obvious depression or anxiety, speech and thought processing grossly intact  ASSESSMENT AND PLAN:  Discussed the following assessment and plan:  Exam is consistent with carpal tunnel syndrome.  Discussed various modalities.  Advised that prednisone is considered relatively safe for breast-feeding mothers.  She was advised to breast-feed first, then take her medication and then wait 4 hours before breast-feeding again.  If no improvement I will have her follow-up and we will refer to sports medicine for steroid injection.  She was advised to continue to wear her wrist splints.  Bilateral carpal tunnel syndrome - Plan: predniSONE (DELTASONE) 10 MG tablet    I discussed the assessment and treatment plan with  the patient. The patient was provided an opportunity to ask questions and all were answered. The patient agreed with the plan and demonstrated an understanding of the instructions.   The patient was advised to call back or seek an in-person evaluation if the symptoms worsen or if the condition fails to improve as anticipated.   Shirline Frees,  NP

## 2018-10-15 DIAGNOSIS — M654 Radial styloid tenosynovitis [de Quervain]: Secondary | ICD-10-CM | POA: Diagnosis not present

## 2019-01-04 DIAGNOSIS — F411 Generalized anxiety disorder: Secondary | ICD-10-CM | POA: Diagnosis not present

## 2019-04-06 ENCOUNTER — Ambulatory Visit (INDEPENDENT_AMBULATORY_CARE_PROVIDER_SITE_OTHER)

## 2019-04-06 ENCOUNTER — Encounter: Payer: Self-pay | Admitting: Adult Health

## 2019-04-06 ENCOUNTER — Other Ambulatory Visit: Payer: Self-pay

## 2019-04-06 ENCOUNTER — Ambulatory Visit (INDEPENDENT_AMBULATORY_CARE_PROVIDER_SITE_OTHER): Admitting: Adult Health

## 2019-04-06 VITALS — BP 120/74 | Temp 98.0°F | Wt 161.0 lb

## 2019-04-06 DIAGNOSIS — W540XXA Bitten by dog, initial encounter: Secondary | ICD-10-CM

## 2019-04-06 DIAGNOSIS — S81852A Open bite, left lower leg, initial encounter: Secondary | ICD-10-CM

## 2019-04-06 DIAGNOSIS — S81832A Puncture wound without foreign body, left lower leg, initial encounter: Secondary | ICD-10-CM

## 2019-04-06 DIAGNOSIS — S81052A Open bite, left knee, initial encounter: Secondary | ICD-10-CM | POA: Diagnosis not present

## 2019-04-06 MED ORDER — FLUCONAZOLE 150 MG PO TABS: 150.0000 mg | ORAL_TABLET | Freq: Every day | ORAL | 1 refills | Status: DC

## 2019-04-06 MED ORDER — AMOXICILLIN-POT CLAVULANATE 875-125 MG PO TABS: 1.0000 | ORAL_TABLET | Freq: Two times a day (BID) | ORAL | 0 refills | Status: DC

## 2019-04-06 NOTE — Progress Notes (Signed)
Subjective:    Patient ID: Nancy Hunter, female    DOB: 06-Nov-1990, 28 y.o.   MRN: [id number]  HPI  28 year old female who  has a past medical history of Anemia, Chronic back pain, and PCOS (polycystic ovarian syndrome).  She presents to the office today for an acute issue.  She works for the [employer] as a Development worker, community carrier.  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  The dog was able to rip through her work pants and pierced the skin.  Animal control was notified and went out to the house, she has been alerted that the dog was up-to-date on all vaccinations.  Since the incident she is noticed some mild swelling, tenderness and redness around the bite marks.  She is able to walk but has a slight limping gait  Review of Systems See HPI   Past Medical History:  Diagnosis Date  . Anemia   . Chronic back pain   . PCOS (polycystic ovarian syndrome)     Social History   Socioeconomic History  . Marital status: Single    Spouse name: Not on file  . Number of children: Not on file  . Years of education: Not on file  . Highest education level: Not on file  Occupational History  . Not on file  Social Needs  . Financial resource strain: Not on file  . Food insecurity    Worry: Not on file    Inability: Not on file  . Transportation needs    Medical: Not on file    Non-medical: Not on file  Tobacco Use  . Smoking status: Never Smoker  . Smokeless tobacco: Never Used  Substance and Sexual Activity  . Alcohol use: Yes    Comment: once a month  . Drug use: No  . Sexual activity: Yes    Birth control/protection: None  Lifestyle  . Physical activity    Days per week: Not on file    Minutes per session: Not on file  . Stress: Not on file  Relationships  . Social Herbalist on phone: Not on file    Gets together: Not on file    Attends religious service: Not on file    Active member of club or  organization: Not on file    Attends meetings of clubs or organizations: Not on file    Relationship status: Not on file  . Intimate partner violence    Fear of current or ex partner: Not on file    Emotionally abused: Not on file    Physically abused: Not on file    Forced sexual activity: Not on file  Other Topics Concern  . 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 ECTOPIC PREGNANCY SURGERY  04/09/2016    Family History  Problem Relation Age of Onset  . Hypertension Maternal Grandmother   . Hyperlipidemia Maternal Grandmother   . Arthritis Maternal Grandmother   . Diabetes Maternal Grandfather   . Hypertension Maternal Grandfather     Allergies  Allergen Reactions  . Latex Hives    Current Outpatient Medications on File Prior to Visit  Medication Sig Dispense Refill  . Prenatal Vit-Fe Fumarate-FA (PRENATAL MULTIVITAMIN) TABS tablet Take 1 tablet by mouth daily at 12 noon.  No current facility-administered medications on file prior to visit.     BP 120/74   Temp 98 F (36.7 C)   Wt 161 lb (73 kg)   BMI 26.79 kg/m       Objective:   Physical Exam Vitals signs and nursing note reviewed.  Constitutional:      Appearance: Normal appearance.  Musculoskeletal:        General: Swelling, tenderness and signs of injury present.       Legs:     Comments: She does have four puncture wounds to the left lateral leg distal to the knee.  Localized erythema as well as edema noted.  Pain with palpation present  Skin:    General: Skin is warm and dry.     Capillary Refill: Capillary refill takes less than 2 seconds.     Findings: Erythema present.  Neurological:     General: No focal deficit present.     Mental Status: She is alert.  Psychiatric:        Mood and Affect: Mood normal.        Behavior: Behavior normal.        Thought Content: Thought content normal.         Judgment: Judgment normal.        Assessment & Plan:  1. Dog bite, initial encounter -We will treat with Augmentin for prophylactic measures.  She is up-to-date on her tetanus.  Will get x-ray to rule out joint involvement.  She was advised to follow-up with signs or symptoms of infection.  She can take Motrin 400 mg every 6 hours as needed for symptom relief - amoxicillin-clavulanate (AUGMENTIN) 875-125 MG tablet; Take 1 tablet by mouth 2 (two) times daily.  Dispense: 20 tablet; Refill: 0 - fluconazole (DIFLUCAN) 150 MG tablet; Take 1 tablet (150 mg total) by mouth daily.  Dispense: 1 tablet; Refill: 1 - DG Knee Complete 4 Views Left; Future - DG Knee Complete 4 Views Left  Shirline Frees, NP

## 2019-04-12 ENCOUNTER — Encounter: Payer: Self-pay | Admitting: Family Medicine

## 2019-04-12 ENCOUNTER — Telehealth: Payer: Self-pay | Admitting: Family Medicine

## 2019-04-12 NOTE — Telephone Encounter (Signed)
Left a message for a return call.

## 2019-04-12 NOTE — Telephone Encounter (Signed)
Spoke to the pt and advised her to pick up a letter from the front desk.  Nothing further needed.

## 2019-04-12 NOTE — Telephone Encounter (Signed)
Ok to be out of work for one week. If not improved will need to follow up

## 2019-04-12 NOTE — Telephone Encounter (Signed)
Copied from [place] [id number]. Topic: General - Inquiry >> Apr 11, 2019  1:43 PM Virl Axe D wrote: Reason for CRM: Pt stated her knee is still bothering her and she is limping. She would like to know if Tommi Rumps can write a note for her to be out of work. Requesting CB from [facility]. Please advise.

## 2019-04-12 NOTE — Telephone Encounter (Signed)
Pt returned call to [facility]. Pt requests call back.

## 2019-05-06 ENCOUNTER — Telehealth: Payer: Self-pay | Admitting: Adult Health

## 2019-05-06 DIAGNOSIS — W540XXA Bitten by dog, initial encounter: Secondary | ICD-10-CM

## 2019-05-06 MED ORDER — FLUCONAZOLE 150 MG PO TABS: 150.0000 mg | ORAL_TABLET | Freq: Every day | ORAL | 1 refills | Status: DC

## 2019-05-06 NOTE — Telephone Encounter (Signed)
Message Routed to PCP CMA 

## 2019-05-06 NOTE — Telephone Encounter (Signed)
Pt called and stated that she took medication fluconazole (DIFLUCAN) 150 MG tablet [[id number]]  at the beginning of starting an antibiotic. Pt states that she still has a yeast infection and would like to know if pill can be refilled. Please advise

## 2019-06-08 DIAGNOSIS — F411 Generalized anxiety disorder: Secondary | ICD-10-CM | POA: Diagnosis not present

## 2019-06-14 DIAGNOSIS — N898 Other specified noninflammatory disorders of vagina: Secondary | ICD-10-CM | POA: Diagnosis not present

## 2019-07-12 DIAGNOSIS — Z01419 Encounter for gynecological examination (general) (routine) without abnormal findings: Secondary | ICD-10-CM | POA: Diagnosis not present

## 2019-07-21 DIAGNOSIS — Z20822 Contact with and (suspected) exposure to covid-19: Secondary | ICD-10-CM | POA: Diagnosis not present

## 2019-07-21 DIAGNOSIS — R509 Fever, unspecified: Secondary | ICD-10-CM | POA: Diagnosis not present

## 2019-07-21 DIAGNOSIS — R112 Nausea with vomiting, unspecified: Secondary | ICD-10-CM | POA: Diagnosis not present

## 2019-07-21 DIAGNOSIS — Z9189 Other specified personal risk factors, not elsewhere classified: Secondary | ICD-10-CM | POA: Diagnosis not present

## 2019-08-24 DIAGNOSIS — Z3202 Encounter for pregnancy test, result negative: Secondary | ICD-10-CM | POA: Diagnosis not present

## 2019-08-24 DIAGNOSIS — N939 Abnormal uterine and vaginal bleeding, unspecified: Secondary | ICD-10-CM | POA: Diagnosis not present

## 2019-08-24 DIAGNOSIS — N930 Postcoital and contact bleeding: Secondary | ICD-10-CM | POA: Diagnosis not present

## 2019-08-31 DIAGNOSIS — N939 Abnormal uterine and vaginal bleeding, unspecified: Secondary | ICD-10-CM | POA: Diagnosis not present

## 2020-01-04 DIAGNOSIS — E282 Polycystic ovarian syndrome: Secondary | ICD-10-CM | POA: Diagnosis not present

## 2020-01-04 DIAGNOSIS — N939 Abnormal uterine and vaginal bleeding, unspecified: Secondary | ICD-10-CM | POA: Diagnosis not present

## 2020-01-04 DIAGNOSIS — Z319 Encounter for procreative management, unspecified: Secondary | ICD-10-CM | POA: Diagnosis not present

## 2020-01-12 DIAGNOSIS — F411 Generalized anxiety disorder: Secondary | ICD-10-CM | POA: Diagnosis not present

## 2020-02-09 DIAGNOSIS — F411 Generalized anxiety disorder: Secondary | ICD-10-CM | POA: Diagnosis not present

## 2020-03-08 DIAGNOSIS — F411 Generalized anxiety disorder: Secondary | ICD-10-CM | POA: Diagnosis not present

## 2020-03-22 DIAGNOSIS — F411 Generalized anxiety disorder: Secondary | ICD-10-CM | POA: Diagnosis not present

## 2020-03-23 DIAGNOSIS — F411 Generalized anxiety disorder: Secondary | ICD-10-CM | POA: Diagnosis not present

## 2020-04-04 DIAGNOSIS — Z319 Encounter for procreative management, unspecified: Secondary | ICD-10-CM | POA: Diagnosis not present

## 2020-04-04 DIAGNOSIS — Z3200 Encounter for pregnancy test, result unknown: Secondary | ICD-10-CM | POA: Diagnosis not present

## 2020-04-04 DIAGNOSIS — N912 Amenorrhea, unspecified: Secondary | ICD-10-CM | POA: Diagnosis not present

## 2020-04-04 DIAGNOSIS — E282 Polycystic ovarian syndrome: Secondary | ICD-10-CM | POA: Diagnosis not present

## 2020-04-19 DIAGNOSIS — F411 Generalized anxiety disorder: Secondary | ICD-10-CM | POA: Diagnosis not present

## 2020-04-27 DIAGNOSIS — Z319 Encounter for procreative management, unspecified: Secondary | ICD-10-CM | POA: Diagnosis not present

## 2020-04-27 DIAGNOSIS — E282 Polycystic ovarian syndrome: Secondary | ICD-10-CM | POA: Diagnosis not present

## 2020-05-17 DIAGNOSIS — F411 Generalized anxiety disorder: Secondary | ICD-10-CM | POA: Diagnosis not present

## 2020-06-06 DIAGNOSIS — Z131 Encounter for screening for diabetes mellitus: Secondary | ICD-10-CM | POA: Diagnosis not present

## 2020-06-06 DIAGNOSIS — B379 Candidiasis, unspecified: Secondary | ICD-10-CM | POA: Diagnosis not present

## 2020-06-07 DIAGNOSIS — F411 Generalized anxiety disorder: Secondary | ICD-10-CM | POA: Diagnosis not present

## 2020-06-21 DIAGNOSIS — F411 Generalized anxiety disorder: Secondary | ICD-10-CM | POA: Diagnosis not present

## 2020-07-05 DIAGNOSIS — M21621 Bunionette of right foot: Secondary | ICD-10-CM | POA: Diagnosis not present

## 2020-07-05 DIAGNOSIS — F411 Generalized anxiety disorder: Secondary | ICD-10-CM | POA: Diagnosis not present

## 2020-07-05 DIAGNOSIS — M71571 Other bursitis, not elsewhere classified, right ankle and foot: Secondary | ICD-10-CM | POA: Diagnosis not present

## 2020-07-05 DIAGNOSIS — M21622 Bunionette of left foot: Secondary | ICD-10-CM | POA: Diagnosis not present

## 2020-07-05 DIAGNOSIS — M71572 Other bursitis, not elsewhere classified, left ankle and foot: Secondary | ICD-10-CM | POA: Diagnosis not present

## 2020-07-11 DIAGNOSIS — M71571 Other bursitis, not elsewhere classified, right ankle and foot: Secondary | ICD-10-CM | POA: Diagnosis not present

## 2020-07-11 DIAGNOSIS — M71572 Other bursitis, not elsewhere classified, left ankle and foot: Secondary | ICD-10-CM | POA: Diagnosis not present

## 2020-07-12 DIAGNOSIS — Z124 Encounter for screening for malignant neoplasm of cervix: Secondary | ICD-10-CM | POA: Diagnosis not present

## 2020-07-12 DIAGNOSIS — B373 Candidiasis of vulva and vagina: Secondary | ICD-10-CM | POA: Diagnosis not present

## 2020-07-12 DIAGNOSIS — Z01419 Encounter for gynecological examination (general) (routine) without abnormal findings: Secondary | ICD-10-CM | POA: Diagnosis not present

## 2020-07-12 DIAGNOSIS — N898 Other specified noninflammatory disorders of vagina: Secondary | ICD-10-CM | POA: Diagnosis not present

## 2020-07-19 DIAGNOSIS — M21621 Bunionette of right foot: Secondary | ICD-10-CM | POA: Diagnosis not present

## 2020-07-19 DIAGNOSIS — M71571 Other bursitis, not elsewhere classified, right ankle and foot: Secondary | ICD-10-CM | POA: Diagnosis not present

## 2020-07-19 DIAGNOSIS — M21622 Bunionette of left foot: Secondary | ICD-10-CM | POA: Diagnosis not present

## 2020-07-19 DIAGNOSIS — M71572 Other bursitis, not elsewhere classified, left ankle and foot: Secondary | ICD-10-CM | POA: Diagnosis not present

## 2020-07-26 DIAGNOSIS — F411 Generalized anxiety disorder: Secondary | ICD-10-CM | POA: Diagnosis not present

## 2020-07-30 DIAGNOSIS — M9903 Segmental and somatic dysfunction of lumbar region: Secondary | ICD-10-CM | POA: Diagnosis not present

## 2020-07-30 DIAGNOSIS — M25552 Pain in left hip: Secondary | ICD-10-CM | POA: Diagnosis not present

## 2020-07-30 DIAGNOSIS — M5451 Vertebrogenic low back pain: Secondary | ICD-10-CM | POA: Diagnosis not present

## 2020-07-30 DIAGNOSIS — M9901 Segmental and somatic dysfunction of cervical region: Secondary | ICD-10-CM | POA: Diagnosis not present

## 2020-07-30 DIAGNOSIS — M542 Cervicalgia: Secondary | ICD-10-CM | POA: Diagnosis not present

## 2020-07-30 DIAGNOSIS — M9905 Segmental and somatic dysfunction of pelvic region: Secondary | ICD-10-CM | POA: Diagnosis not present

## 2020-07-30 DIAGNOSIS — M9902 Segmental and somatic dysfunction of thoracic region: Secondary | ICD-10-CM | POA: Diagnosis not present

## 2020-08-02 DIAGNOSIS — M9905 Segmental and somatic dysfunction of pelvic region: Secondary | ICD-10-CM | POA: Diagnosis not present

## 2020-08-02 DIAGNOSIS — M9901 Segmental and somatic dysfunction of cervical region: Secondary | ICD-10-CM | POA: Diagnosis not present

## 2020-08-02 DIAGNOSIS — M542 Cervicalgia: Secondary | ICD-10-CM | POA: Diagnosis not present

## 2020-08-02 DIAGNOSIS — M9903 Segmental and somatic dysfunction of lumbar region: Secondary | ICD-10-CM | POA: Diagnosis not present

## 2020-08-08 DIAGNOSIS — M9901 Segmental and somatic dysfunction of cervical region: Secondary | ICD-10-CM | POA: Diagnosis not present

## 2020-08-08 DIAGNOSIS — M9902 Segmental and somatic dysfunction of thoracic region: Secondary | ICD-10-CM | POA: Diagnosis not present

## 2020-08-08 DIAGNOSIS — M9905 Segmental and somatic dysfunction of pelvic region: Secondary | ICD-10-CM | POA: Diagnosis not present

## 2020-08-08 DIAGNOSIS — M9903 Segmental and somatic dysfunction of lumbar region: Secondary | ICD-10-CM | POA: Diagnosis not present

## 2020-08-10 DIAGNOSIS — M9901 Segmental and somatic dysfunction of cervical region: Secondary | ICD-10-CM | POA: Diagnosis not present

## 2020-08-10 DIAGNOSIS — M9902 Segmental and somatic dysfunction of thoracic region: Secondary | ICD-10-CM | POA: Diagnosis not present

## 2020-08-10 DIAGNOSIS — M9903 Segmental and somatic dysfunction of lumbar region: Secondary | ICD-10-CM | POA: Diagnosis not present

## 2020-08-10 DIAGNOSIS — M9905 Segmental and somatic dysfunction of pelvic region: Secondary | ICD-10-CM | POA: Diagnosis not present

## 2020-08-16 DIAGNOSIS — M9901 Segmental and somatic dysfunction of cervical region: Secondary | ICD-10-CM | POA: Diagnosis not present

## 2020-08-16 DIAGNOSIS — M9902 Segmental and somatic dysfunction of thoracic region: Secondary | ICD-10-CM | POA: Diagnosis not present

## 2020-08-16 DIAGNOSIS — M9903 Segmental and somatic dysfunction of lumbar region: Secondary | ICD-10-CM | POA: Diagnosis not present

## 2020-08-16 DIAGNOSIS — M9905 Segmental and somatic dysfunction of pelvic region: Secondary | ICD-10-CM | POA: Diagnosis not present

## 2020-08-21 DIAGNOSIS — M9903 Segmental and somatic dysfunction of lumbar region: Secondary | ICD-10-CM | POA: Diagnosis not present

## 2020-08-21 DIAGNOSIS — M9905 Segmental and somatic dysfunction of pelvic region: Secondary | ICD-10-CM | POA: Diagnosis not present

## 2020-08-21 DIAGNOSIS — M9901 Segmental and somatic dysfunction of cervical region: Secondary | ICD-10-CM | POA: Diagnosis not present

## 2020-08-21 DIAGNOSIS — M9902 Segmental and somatic dysfunction of thoracic region: Secondary | ICD-10-CM | POA: Diagnosis not present

## 2020-08-24 DIAGNOSIS — M9902 Segmental and somatic dysfunction of thoracic region: Secondary | ICD-10-CM | POA: Diagnosis not present

## 2020-08-24 DIAGNOSIS — M9901 Segmental and somatic dysfunction of cervical region: Secondary | ICD-10-CM | POA: Diagnosis not present

## 2020-08-24 DIAGNOSIS — M9903 Segmental and somatic dysfunction of lumbar region: Secondary | ICD-10-CM | POA: Diagnosis not present

## 2020-08-24 DIAGNOSIS — M9905 Segmental and somatic dysfunction of pelvic region: Secondary | ICD-10-CM | POA: Diagnosis not present

## 2020-08-30 DIAGNOSIS — M9905 Segmental and somatic dysfunction of pelvic region: Secondary | ICD-10-CM | POA: Diagnosis not present

## 2020-08-30 DIAGNOSIS — M542 Cervicalgia: Secondary | ICD-10-CM | POA: Diagnosis not present

## 2020-08-30 DIAGNOSIS — M9901 Segmental and somatic dysfunction of cervical region: Secondary | ICD-10-CM | POA: Diagnosis not present

## 2020-08-30 DIAGNOSIS — M9903 Segmental and somatic dysfunction of lumbar region: Secondary | ICD-10-CM | POA: Diagnosis not present

## 2020-08-30 DIAGNOSIS — M9902 Segmental and somatic dysfunction of thoracic region: Secondary | ICD-10-CM | POA: Diagnosis not present

## 2020-09-04 DIAGNOSIS — M9903 Segmental and somatic dysfunction of lumbar region: Secondary | ICD-10-CM | POA: Diagnosis not present

## 2020-09-04 DIAGNOSIS — M9902 Segmental and somatic dysfunction of thoracic region: Secondary | ICD-10-CM | POA: Diagnosis not present

## 2020-09-04 DIAGNOSIS — M9905 Segmental and somatic dysfunction of pelvic region: Secondary | ICD-10-CM | POA: Diagnosis not present

## 2020-09-04 DIAGNOSIS — M9907 Segmental and somatic dysfunction of upper extremity: Secondary | ICD-10-CM | POA: Diagnosis not present

## 2020-09-04 DIAGNOSIS — M9901 Segmental and somatic dysfunction of cervical region: Secondary | ICD-10-CM | POA: Diagnosis not present

## 2020-09-20 DIAGNOSIS — F411 Generalized anxiety disorder: Secondary | ICD-10-CM | POA: Diagnosis not present

## 2020-09-21 DIAGNOSIS — F411 Generalized anxiety disorder: Secondary | ICD-10-CM | POA: Diagnosis not present

## 2020-10-04 DIAGNOSIS — F411 Generalized anxiety disorder: Secondary | ICD-10-CM | POA: Diagnosis not present

## 2020-10-30 DIAGNOSIS — N898 Other specified noninflammatory disorders of vagina: Secondary | ICD-10-CM | POA: Diagnosis not present

## 2020-11-14 ENCOUNTER — Encounter: Payer: Self-pay | Admitting: Adult Health

## 2020-11-14 ENCOUNTER — Telehealth (INDEPENDENT_AMBULATORY_CARE_PROVIDER_SITE_OTHER): Payer: BC Managed Care – PPO | Admitting: Adult Health

## 2020-11-14 VITALS — Ht 65.0 in | Wt 140.0 lb

## 2020-11-14 DIAGNOSIS — F411 Generalized anxiety disorder: Secondary | ICD-10-CM | POA: Diagnosis not present

## 2020-11-14 DIAGNOSIS — F419 Anxiety disorder, unspecified: Secondary | ICD-10-CM | POA: Diagnosis not present

## 2020-11-14 MED ORDER — CITALOPRAM HYDROBROMIDE 20 MG PO TABS: 20.0000 mg | ORAL_TABLET | Freq: Every day | ORAL | 1 refills | Status: DC

## 2020-11-14 NOTE — Progress Notes (Signed)
Virtual Visit via Telephone Note  I connected with Nancy Hunter on 11/14/20 at  3:30 PM EDT by telephone and verified that I am speaking with the correct person using two identifiers.   I discussed the limitations, risks, security and privacy concerns of performing an evaluation and management service by telephone and the availability of in person appointments. I also discussed with the patient that there may be a patient responsible charge related to this service. The patient expressed understanding and agreed to proceed.  Location patient: home Location provider: work or home office Participants present for the call: patient, provider Patient did not have a visit in the prior 7 days to address this/these issue(s).   History of Present Illness: 30 year old female who is being evaluated today for recurrent anxiety.  We last evaluated her for this back in 2017/2018.  At this time she had a family member passed away and she was having trouble coming pregnant due to history of PCOS.  In the past she was on Celexa and did fairly well with this medication.  Reports that lately her anxiety has become worse and more unrelenting.  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  She is not sleeping well because she feels as though she cannot turn off her brain and feels physically and emotionally exhausted.  She is having panic attacks and "breaking down".  Does not feel depressed.   Observations/Objective: Patient sounds cheerful and well on the phone. I do not appreciate any SOB. Speech and thought processing are grossly intact. Patient reported vitals:  Assessment and Plan: 1. Anxiety -GAD-7 score of 13.  Will place back on Celexa 20 mg since she did well with this in the past.  Have her follow-up in 1 month for repeat  evaluation - citalopram (CELEXA) 20 MG tablet; Take 1 tablet (20 mg total) by mouth daily.  Dispense: 30 tablet; Refill: 1    Follow Up Instructions:   I did not refer this patient for an OV in the next 24 hours for this/these issue(s).  I discussed the assessment and treatment plan with the patient. The patient was provided an opportunity to ask questions and all were answered. The patient agreed with the plan and demonstrated an understanding of the instructions.   The patient was advised to call back or seek an in-person evaluation if the symptoms worsen or if the condition fails to improve as anticipated.  I provided 18  minutes of non-face-to-face time during this encounter.   Shirline Frees, NP

## 2020-11-28 DIAGNOSIS — F411 Generalized anxiety disorder: Secondary | ICD-10-CM | POA: Diagnosis not present

## 2021-01-29 ENCOUNTER — Other Ambulatory Visit: Payer: Self-pay | Admitting: Adult Health

## 2021-01-29 DIAGNOSIS — F419 Anxiety disorder, unspecified: Secondary | ICD-10-CM

## 2021-01-30 NOTE — Telephone Encounter (Signed)
Pt need to schedule a f/u visit medication refill. This was a new Rx and Kandee Keen need to make sure everything is ok before sendinfg refills. This can be a virtual visit as well.

## 2021-02-03 DIAGNOSIS — J069 Acute upper respiratory infection, unspecified: Secondary | ICD-10-CM | POA: Diagnosis not present

## 2021-02-05 DIAGNOSIS — E282 Polycystic ovarian syndrome: Secondary | ICD-10-CM | POA: Diagnosis not present

## 2021-02-05 DIAGNOSIS — N912 Amenorrhea, unspecified: Secondary | ICD-10-CM | POA: Diagnosis not present

## 2021-02-05 DIAGNOSIS — Z3169 Encounter for other general counseling and advice on procreation: Secondary | ICD-10-CM | POA: Diagnosis not present

## 2021-02-05 DIAGNOSIS — E288 Other ovarian dysfunction: Secondary | ICD-10-CM | POA: Diagnosis not present

## 2021-02-17 DIAGNOSIS — Z20822 Contact with and (suspected) exposure to covid-19: Secondary | ICD-10-CM | POA: Diagnosis not present

## 2021-02-17 DIAGNOSIS — R519 Headache, unspecified: Secondary | ICD-10-CM | POA: Diagnosis not present

## 2021-02-17 DIAGNOSIS — J0100 Acute maxillary sinusitis, unspecified: Secondary | ICD-10-CM | POA: Diagnosis not present

## 2021-02-17 DIAGNOSIS — R490 Dysphonia: Secondary | ICD-10-CM | POA: Diagnosis not present

## 2021-02-17 DIAGNOSIS — R0981 Nasal congestion: Secondary | ICD-10-CM | POA: Diagnosis not present

## 2021-02-21 ENCOUNTER — Telehealth: Payer: BC Managed Care – PPO | Admitting: Adult Health

## 2021-02-21 ENCOUNTER — Other Ambulatory Visit: Payer: Self-pay | Admitting: Adult Health

## 2021-02-21 DIAGNOSIS — F419 Anxiety disorder, unspecified: Secondary | ICD-10-CM

## 2021-02-27 DIAGNOSIS — Z3169 Encounter for other general counseling and advice on procreation: Secondary | ICD-10-CM | POA: Diagnosis not present

## 2021-02-27 DIAGNOSIS — Z3141 Encounter for fertility testing: Secondary | ICD-10-CM | POA: Diagnosis not present

## 2021-03-08 DIAGNOSIS — F411 Generalized anxiety disorder: Secondary | ICD-10-CM | POA: Diagnosis not present

## 2021-03-14 DIAGNOSIS — Z3201 Encounter for pregnancy test, result positive: Secondary | ICD-10-CM | POA: Diagnosis not present

## 2021-03-14 DIAGNOSIS — Z3200 Encounter for pregnancy test, result unknown: Secondary | ICD-10-CM | POA: Diagnosis not present

## 2021-03-18 DIAGNOSIS — Z3200 Encounter for pregnancy test, result unknown: Secondary | ICD-10-CM | POA: Diagnosis not present

## 2021-03-18 DIAGNOSIS — E282 Polycystic ovarian syndrome: Secondary | ICD-10-CM | POA: Diagnosis not present

## 2021-03-18 DIAGNOSIS — Z3141 Encounter for fertility testing: Secondary | ICD-10-CM | POA: Diagnosis not present

## 2021-03-18 DIAGNOSIS — E288 Other ovarian dysfunction: Secondary | ICD-10-CM | POA: Diagnosis not present

## 2021-03-19 DIAGNOSIS — J101 Influenza due to other identified influenza virus with other respiratory manifestations: Secondary | ICD-10-CM | POA: Diagnosis not present

## 2021-03-19 DIAGNOSIS — Z20822 Contact with and (suspected) exposure to covid-19: Secondary | ICD-10-CM | POA: Diagnosis not present

## 2021-03-20 ENCOUNTER — Other Ambulatory Visit: Payer: Self-pay

## 2021-03-20 ENCOUNTER — Encounter (HOSPITAL_COMMUNITY): Payer: Self-pay

## 2021-03-20 ENCOUNTER — Emergency Department (HOSPITAL_COMMUNITY)
Admission: EM | Admit: 2021-03-20 | Discharge: 2021-03-20 | Disposition: A | Discharge status: Home / Self Care | Payer: BC Managed Care – PPO | Attending: Emergency Medicine | Admitting: Emergency Medicine

## 2021-03-20 DIAGNOSIS — O98511 Other viral diseases complicating pregnancy, first trimester: Secondary | ICD-10-CM | POA: Diagnosis not present

## 2021-03-20 DIAGNOSIS — Z91040 Latex allergy status: Secondary | ICD-10-CM | POA: Diagnosis not present

## 2021-03-20 DIAGNOSIS — J111 Influenza due to unidentified influenza virus with other respiratory manifestations: Secondary | ICD-10-CM | POA: Diagnosis not present

## 2021-03-20 NOTE — ED Triage Notes (Addendum)
Pt states she was diagnosed with flu yesterday, was sent home with tamiflu. Pt states she has continued to have a temp of over 101 and feels lethargic. Pt c/o weakness starting yesterday. Pt states she is approx [redacted] weeks pregnant, her first ultrasound appt is next week.

## 2021-03-20 NOTE — ED Provider Notes (Signed)
[facility]-EMERGENCY DEPT Provider Note   CSN: [id number] Arrival date & time: 03/20/21  1222     History Chief Complaint  Patient presents with   Influenza    Nancy Hunter is a 30 y.o. female who is [redacted] weeks pregnant who presents to the emergency department with flulike symptoms.  She was diagnosed with influenza and placed on Tamiflu yesterday urgent care.  Was instructed by her primary care provider to come to the emergency department for evaluation as she is not feeling better.  She reports associated general malaise, fatigue, intermittent fevers that is relieved with Tylenol, and mild cough.  She denies any abdominal pain, nausea, vomiting, diarrhea, sore throat, shortness of breath, chest pain, and syncope.   Influenza     Past Medical History:  Diagnosis Date   Anemia    Chronic back pain    PCOS (polycystic ovarian syndrome)     Patient Active Problem List   Diagnosis Date Noted   Vaginal delivery 05/19/2018   Sleep disturbance 10/03/2015   Migraines 10/03/2015    Past Surgical History:  Procedure Laterality Date   ECTOPIC PREGNANCY SURGERY  04/09/2016     OB History     Gravida  3   Para  1   Term  1   Preterm  0   AB  2   Living  1      SAB  1   IAB  0   Ectopic  1   Multiple  0   Live Births  1           Family History  Problem Relation Age of Onset   Hypertension Maternal Grandmother    Hyperlipidemia Maternal Grandmother    Arthritis Maternal Grandmother    Diabetes Maternal Grandfather    Hypertension Maternal Grandfather     Social History   Tobacco Use   Smoking status: Never   Smokeless tobacco: Never  Vaping Use   Vaping Use: Never used  Substance Use Topics   Alcohol use: Yes    Comment: once a month   Drug use: No    Home Medications Prior to Admission medications   Medication Sig Start Date End Date Taking? Authorizing Provider  citalopram (CELEXA) 20 MG tablet TAKE 1 TABLET(20 MG)  BY MOUTH DAILY 02/21/21   Nafziger, Kandee Keen, NP  Prenatal Vit-Fe Fumarate-FA (PRENATAL MULTIVITAMIN) TABS tablet Take 1 tablet by mouth daily at 12 noon.    [provider]    Allergies    Latex  Review of Systems   Review of Systems  All other systems reviewed and are negative.  Physical Exam Updated Vital Signs BP 119/73   Pulse 95   Temp 98.5 F (36.9 C) (Oral)   Resp 18   SpO2 99%   Physical Exam Vitals and nursing note reviewed.  Constitutional:      General: She is not in acute distress.    Appearance: Normal appearance.  HENT:     Head: Normocephalic and atraumatic.  Eyes:     General:        Right eye: No discharge.        Left eye: No discharge.  Cardiovascular:     Comments: Regular rate and rhythm.  S1/S2 are distinct without any evidence of murmur, rubs, or gallops.  Radial pulses are 2+ bilaterally.  Dorsalis pedis pulses are 2+ bilaterally.  No evidence of pedal edema. Pulmonary:     Comments: Clear to auscultation bilaterally.  Normal effort.  No respiratory distress.  No evidence of wheezes, rales, or rhonchi heard throughout. Abdominal:     General: Abdomen is flat. Bowel sounds are normal. There is no distension.     Tenderness: There is no abdominal tenderness. There is no guarding or rebound.  Musculoskeletal:        General: Normal range of motion.     Cervical back: Neck supple.  Skin:    General: Skin is warm and dry.     Findings: No rash.  Neurological:     General: No focal deficit present.     Mental Status: She is alert.  Psychiatric:        Mood and Affect: Mood normal.        Behavior: Behavior normal.    ED Results / Procedures / Treatments   Labs (all labs ordered are listed, but only abnormal results are displayed) Labs Reviewed - No data to display  EKG None  Radiology No results found.  Procedures Procedures   Medications Ordered in ED Medications - No data to display  ED Course  I have reviewed the triage  vital signs and the nursing notes.  Pertinent labs & imaging results that were available during my care of the patient were reviewed by me and considered in my medical decision making (see chart for details).    MDM Rules/Calculators/A&P                          Nancy Hunter is a 30 y.o. female who presents the emergency department for further evaluation of influenza.  Patient has known influenza and is on the appropriate goal-directed therapy.  Shared decision-making was done on whether or not the patient would like to wait for intravenous fluids in her room versus increasing p.o. intake at home.  Patient wishes to have increased p.o. intake at home.  I instructed that Gatorade, Powerade, Pedialyte, watered-down apple juice, regular water, liquid IV are all appropriate oral hydration.  Strict return precautions were given.  I also would like her [facility] concern that she is [redacted] weeks pregnant in the event that she needs to be reevaluated.  Patient expressed full understanding and all questions and concerns addressed.   Final Clinical Impression(s) / ED Diagnoses Final diagnoses:  Influenza    Rx / DC Orders ED Discharge Orders     None        Jolyn Lent 03/20/21 1454    Gerhard Munch, MD 03/20/21 1717

## 2021-03-20 NOTE — ED Notes (Signed)
An After Visit Summary was printed and given to the patient. Discharge instructions given and no further questions at this time.  

## 2021-03-20 NOTE — Discharge Instructions (Signed)
Please drink plenty of fluids and get plenty of rest.  Continue taking Tamiflu as prescribed.  Please follow-up with your primary care provider as needed.  Please turn to the [facility] emergency department as you are pregnant if you experience worsening symptoms, intractable vomiting, diarrhea, or any other concerns you might have.

## 2021-03-29 ENCOUNTER — Telehealth: Payer: Self-pay

## 2021-03-29 DIAGNOSIS — Z3200 Encounter for pregnancy test, result unknown: Secondary | ICD-10-CM | POA: Diagnosis not present

## 2021-03-29 NOTE — Telephone Encounter (Signed)
Patient notified of update  and verbalized understanding. 

## 2021-03-29 NOTE — Telephone Encounter (Signed)
Pt stated that she found out she was [redacted] weeks pregnant with twins. Pt stated that her OBGYN advised her not to just stop the medication. Pt wanting to know if there is an alternatives that she can take.

## 2021-03-29 NOTE — Telephone Encounter (Signed)
Patient called asking if an alternate Rx can be given due to patient being pregnant per Ob/Gyn request  citalopram (CELEXA) 20 MG tablet

## 2021-03-29 NOTE — Telephone Encounter (Signed)
Please advise 

## 2021-04-02 DIAGNOSIS — F411 Generalized anxiety disorder: Secondary | ICD-10-CM | POA: Diagnosis not present

## 2021-04-09 DIAGNOSIS — F411 Generalized anxiety disorder: Secondary | ICD-10-CM | POA: Diagnosis not present

## 2021-04-10 DIAGNOSIS — O0900 Supervision of pregnancy with history of infertility, unspecified trimester: Secondary | ICD-10-CM | POA: Diagnosis not present

## 2021-04-18 DIAGNOSIS — Z3481 Encounter for supervision of other normal pregnancy, first trimester: Secondary | ICD-10-CM | POA: Diagnosis not present

## 2021-04-18 DIAGNOSIS — Z3490 Encounter for supervision of normal pregnancy, unspecified, unspecified trimester: Secondary | ICD-10-CM | POA: Diagnosis not present

## 2021-04-18 LAB — OB RESULTS CONSOLE HIV ANTIBODY (ROUTINE TESTING): HIV: NONREACTIVE

## 2021-04-18 LAB — OB RESULTS CONSOLE ABO/RH: RH Type: POSITIVE

## 2021-04-18 LAB — HEPATITIS C ANTIBODY: HCV Ab: NEGATIVE

## 2021-04-18 LAB — OB RESULTS CONSOLE HEPATITIS B SURFACE ANTIGEN: Hepatitis B Surface Ag: NEGATIVE

## 2021-04-18 LAB — OB RESULTS CONSOLE ANTIBODY SCREEN: Antibody Screen: NEGATIVE

## 2021-04-18 LAB — OB RESULTS CONSOLE RUBELLA ANTIBODY, IGM: Rubella: IMMUNE

## 2021-04-19 DIAGNOSIS — Z3481 Encounter for supervision of other normal pregnancy, first trimester: Secondary | ICD-10-CM | POA: Diagnosis not present

## 2021-04-19 LAB — OB RESULTS CONSOLE GC/CHLAMYDIA
Chlamydia: NEGATIVE
Neisseria Gonorrhea: NEGATIVE

## 2021-04-23 ENCOUNTER — Other Ambulatory Visit: Payer: Self-pay | Admitting: Obstetrics and Gynecology

## 2021-04-23 DIAGNOSIS — Z363 Encounter for antenatal screening for malformations: Secondary | ICD-10-CM

## 2021-05-08 DIAGNOSIS — L292 Pruritus vulvae: Secondary | ICD-10-CM | POA: Diagnosis not present

## 2021-05-09 ENCOUNTER — Ambulatory Visit: Payer: BC Managed Care – PPO | Admitting: Adult Health

## 2021-05-09 ENCOUNTER — Encounter: Payer: Self-pay | Admitting: Adult Health

## 2021-05-09 VITALS — BP 126/62 | HR 65 | Temp 98.6°F | Wt 137.2 lb

## 2021-05-09 DIAGNOSIS — M25531 Pain in right wrist: Secondary | ICD-10-CM

## 2021-05-09 NOTE — Progress Notes (Signed)
Subjective:    Patient ID: Nancy Hunter, female    DOB: May 04, 1991, 30 y.o.   MRN: [id number]  HPI  30 year old female who  has a past medical history of Anemia, Chronic back pain, and PCOS (polycystic ovarian syndrome).  She presents to the office today for an acute complaint of right wrist pain x1 week.  She reports that she hit her wrist "very hard" on a car seat roughly 1 week ago.  Reports that her wrist was swollen, bruised, and tender to touch, this has resolved over the last week.  She reports that if she accidentally hits her wrist on something the pain comes back.  She has no issues with range of motion of her right wrist or fingers.   Review of Systems See HPI   Past Medical History:  Diagnosis Date   Anemia    Chronic back pain    PCOS (polycystic ovarian syndrome)     Social History   Socioeconomic History   Marital status: Single    Spouse name: Not on file   Number of children: Not on file   Years of education: Not on file   Highest education level: Not on file  Occupational History   Not on file  Tobacco Use   Smoking status: Never   Smokeless tobacco: Never  Vaping Use   Vaping Use: Never used  Substance and Sexual Activity   Alcohol use: Yes    Comment: once a month   Drug use: No   Sexual activity: Yes    Birth control/protection: None  Other Topics Concern   Not on file  Social History Narrative   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, community   Social Determinants of Corporate investment banker Strain: Not on file  Food Insecurity: Not on file  Transportation Needs: Not on file  Physical Activity: Not on file  Stress: Not on file  Social Connections: Not on file  Intimate Partner Violence: Not on file    Past Surgical History:  Procedure Laterality Date   ECTOPIC PREGNANCY SURGERY  04/09/2016    Family History  Problem Relation Age of Onset   Hypertension Maternal Grandmother    Hyperlipidemia Maternal Grandmother    Arthritis Maternal  Grandmother    Diabetes Maternal Grandfather    Hypertension Maternal Grandfather     Allergies  Allergen Reactions   Latex Hives    Current Outpatient Medications on File Prior to Visit  Medication Sig Dispense Refill   citalopram (CELEXA) 20 MG tablet TAKE 1 TABLET(20 MG) BY MOUTH DAILY 30 tablet 1   Prenatal Vit-Fe Fumarate-FA (PRENATAL MULTIVITAMIN) TABS tablet Take 1 tablet by mouth daily at 12 noon.     No current facility-administered medications on file prior to visit.    BP 126/62 (BP Location: Left Arm, Patient Position: Sitting, Cuff Size: Normal)    Pulse 65    Temp 98.6 F (37 C) (Oral)    Wt 137 lb 3.2 oz (62.2 kg)    SpO2 100%    BMI 22.83 kg/m       Objective:   Physical Exam Vitals and nursing note reviewed.  Constitutional:      Appearance: Normal appearance.  Musculoskeletal:     Right wrist: Bony tenderness present. No swelling, deformity, tenderness, snuff box tenderness or crepitus. Normal range of motion. Normal pulse.     Comments: Mild tenderness to scaphoid bone. Is able to move all digits without difficulty and  has great ROM with right wrist. No bruising or swelling noted.   Skin:    General: Skin is warm and dry.     Capillary Refill: Capillary refill takes less than 2 seconds.  Neurological:     General: No focal deficit present.     Mental Status: She is alert and oriented to person, place, and time.  Psychiatric:        Mood and Affect: Mood normal.        Behavior: Behavior normal.        Thought Content: Thought content normal.        Judgment: Judgment normal.      Assessment & Plan:  1. Right wrist pain -No concern for fracture or dislocation.  Reassurance given.  Likely bone bruise.  Advised ice is much as possible.  She is pregnant, she can use Tylenol as needed for pain control.  Shirline Frees, NP

## 2021-05-09 NOTE — Progress Notes (Signed)
° °  Subjective:    Patient ID: Nancy Hunter, female    DOB: 09/16/1990, 30 y.o.   MRN: [id number]  HPI    Review of Systems     Objective:   Physical Exam        Assessment & Plan:

## 2021-05-10 ENCOUNTER — Encounter: Payer: Self-pay | Admitting: *Deleted

## 2021-05-17 ENCOUNTER — Other Ambulatory Visit: Payer: Self-pay

## 2021-05-17 ENCOUNTER — Ambulatory Visit: Payer: BC Managed Care – PPO | Admitting: *Deleted

## 2021-05-17 ENCOUNTER — Ambulatory Visit: Payer: BC Managed Care – PPO | Attending: Obstetrics and Gynecology

## 2021-05-17 ENCOUNTER — Ambulatory Visit: Payer: BC Managed Care – PPO

## 2021-05-17 ENCOUNTER — Other Ambulatory Visit: Payer: Self-pay | Admitting: *Deleted

## 2021-05-17 ENCOUNTER — Ambulatory Visit (HOSPITAL_BASED_OUTPATIENT_CLINIC_OR_DEPARTMENT_OTHER): Payer: BC Managed Care – PPO | Admitting: Obstetrics

## 2021-05-17 VITALS — BP 118/66 | HR 97

## 2021-05-17 DIAGNOSIS — Z3A13 13 weeks gestation of pregnancy: Secondary | ICD-10-CM | POA: Insufficient documentation

## 2021-05-17 DIAGNOSIS — O30042 Twin pregnancy, dichorionic/diamniotic, second trimester: Secondary | ICD-10-CM | POA: Diagnosis not present

## 2021-05-17 DIAGNOSIS — O30041 Twin pregnancy, dichorionic/diamniotic, first trimester: Secondary | ICD-10-CM

## 2021-05-17 DIAGNOSIS — D573 Sickle-cell trait: Secondary | ICD-10-CM

## 2021-05-17 DIAGNOSIS — Z3481 Encounter for supervision of other normal pregnancy, first trimester: Secondary | ICD-10-CM | POA: Diagnosis not present

## 2021-05-17 DIAGNOSIS — Z363 Encounter for antenatal screening for malformations: Secondary | ICD-10-CM

## 2021-05-17 NOTE — Progress Notes (Signed)
MFM Note  Nancy Hunter was seen due to a spontaneously conceived twin pregnancy.  She denies any significant past medical history and denies any problems in her current pregnancy.  She has screened positive as a carrier for the sickle cell trait.  Her partner reports that he does not carry the sickle cell trait.  A thick dividing membrane was noted separating the two fetuses, indicating that these are dichorionic, diamniotic twins.  The crown-rump length measured for both twin A and twin B are consistent with an EDC of November 24, 2021.  There was normal amniotic fluid noted around both fetuses.  The management of dichorionic twins was discussed.  She was advised that management of twin pregnancies will involve frequent ultrasound exams to assess the fetal growth and amniotic fluid level.  A detailed fetal anatomy scan has been scheduled for her in our office at around 19 weeks.  We will then continue to follow her with serial growth ultrasounds.  Weekly fetal testing for dichorionic twins should start at around 36 weeks.  Delivery for uncomplicated dichorionic twins should occur at around 38 weeks.  The increased risk of preeclampsia, gestational diabetes, and preterm birth/labor associated with twin pregnancies was discussed.  She was advised that she will continue to be followed closely to assess for these conditions. As pregnancies with multiple gestations are at increased risk for developing preeclampsia, she was advised to start taking 1 to 2 tablets of baby aspirin (81 mg per day) daily to decrease her risk of developing preeclampsia.   As the patient has not had any screening tests for fetal aneuploidy drawn in her current pregnancy, she had the [employer] Panor[employer] cell free DNA test drawn in our office today.  Our genetic counselor will notify her regarding the results of this test.  She was advised that the Panorama cell free DNA test will be able to determine the zygosity and the genders of both  babies in addition to screening for Down syndrome.  A detailed fetal anatomy scan has been scheduled for her in 5 weeks.    The patient stated that all of her questions have been answered.  A total of 30 minutes was spent counseling and coordinating the care for this patient.  Greater than 50% of the time was spent in direct face-to-face contact.

## 2021-05-21 ENCOUNTER — Other Ambulatory Visit: Payer: Self-pay | Admitting: *Deleted

## 2021-05-21 DIAGNOSIS — O99019 Anemia complicating pregnancy, unspecified trimester: Secondary | ICD-10-CM

## 2021-05-21 DIAGNOSIS — O30042 Twin pregnancy, dichorionic/diamniotic, second trimester: Secondary | ICD-10-CM

## 2021-05-21 DIAGNOSIS — D573 Sickle-cell trait: Secondary | ICD-10-CM

## 2021-05-30 ENCOUNTER — Other Ambulatory Visit: Payer: Self-pay

## 2021-06-19 ENCOUNTER — Other Ambulatory Visit: Payer: Self-pay

## 2021-06-19 ENCOUNTER — Other Ambulatory Visit: Payer: Self-pay | Admitting: *Deleted

## 2021-06-19 ENCOUNTER — Ambulatory Visit: Payer: 59 | Attending: Obstetrics

## 2021-06-19 ENCOUNTER — Ambulatory Visit: Payer: 59 | Admitting: *Deleted

## 2021-06-19 ENCOUNTER — Ambulatory Visit (HOSPITAL_BASED_OUTPATIENT_CLINIC_OR_DEPARTMENT_OTHER): Payer: 59 | Admitting: Obstetrics and Gynecology

## 2021-06-19 ENCOUNTER — Encounter: Payer: Self-pay | Admitting: *Deleted

## 2021-06-19 VITALS — BP 116/62 | HR 77

## 2021-06-19 DIAGNOSIS — O30043 Twin pregnancy, dichorionic/diamniotic, third trimester: Secondary | ICD-10-CM

## 2021-06-19 DIAGNOSIS — O99019 Anemia complicating pregnancy, unspecified trimester: Secondary | ICD-10-CM | POA: Diagnosis present

## 2021-06-19 DIAGNOSIS — O09812 Supervision of pregnancy resulting from assisted reproductive technology, second trimester: Secondary | ICD-10-CM

## 2021-06-19 DIAGNOSIS — O99012 Anemia complicating pregnancy, second trimester: Secondary | ICD-10-CM | POA: Diagnosis not present

## 2021-06-19 DIAGNOSIS — O30042 Twin pregnancy, dichorionic/diamniotic, second trimester: Secondary | ICD-10-CM | POA: Diagnosis present

## 2021-06-19 DIAGNOSIS — Z3689 Encounter for other specified antenatal screening: Secondary | ICD-10-CM

## 2021-06-19 DIAGNOSIS — D573 Sickle-cell trait: Secondary | ICD-10-CM | POA: Diagnosis present

## 2021-06-19 DIAGNOSIS — Z3A18 18 weeks gestation of pregnancy: Secondary | ICD-10-CM | POA: Insufficient documentation

## 2021-06-19 NOTE — Progress Notes (Signed)
Maternal-Fetal Medicine   Name: Nancy Hunter DOB: 24-Dec-1990 MRN: [id number] Referring Provider: [name]   I had the pleasure of seeing [facility] at [facility]. She is G4 P1021 at 18w 3d gestation and is here for fetal anatomy scan. Patient had fertility treatment and took letrozole and metformin.  Past medical history: No history of diabetes or hypertension or any chronic medical conditions. Past surgical history: Oral surgery. Medications: Prenatal vitamins, low-dose aspirin, folic acid, iron, Colace. Allergies: No known drug allergies. Social history: Denies tobacco or drug or alcohol use.  She is engaged to be married and her fianc is [place] [employer] architect and he is in good health. Family history: No history of venous thromboembolism in the family. Obstetric history significant for a term vaginal delivery in 2019 of a female infant weighing 7 pounds 1 ounce at birth.  Her pregnancy and delivery were uncomplicated.  She had an ectopic gestation in 2017 that was treated with methotrexate. GYN history: No history of abnormal Pap smears or cervical surgeries.  No history of breast disease.  Prenatal course: On cell free fetal DNA screening the risks of fetal aneuploidies are not increased.  Dizygotic twins were detected.  Blood pressure today at her office is 116/62 mmHg; pulse 77/minute.  Ultrasound We confirmed dichorionic-diamniotic twin pregnancy. Twin A: Maternal right, cephalic presentation, posterior placenta, female fetus.  Fetal biometry is consistent with the previously established dates.  Amniotic fluid is normal and good fetal activity seen.  No markers of aneuploidies or fetal structural defects are seen.  Twin B: Maternal left, variable presentation, posterior placenta, female fetus. Fetal biometry is consistent with the previously established dates.  Amniotic fluid is normal and good fetal activity seen.  No markers of aneuploidies or fetal  structural defects are seen.  Growth discordancy: 4% (normal). On transabdominal scan, the cervix looks long and closed.  Dichorionic-diamniotic twin pregnancy I explained the significance of chorionicity and its implications. I explained the possible complications associated with twin pregnancy that include preterm labor/delivery (most common), fetal growth restriction of one or both twins, miscarriage, malpresentations and increased cesarean delivery rate, postpartum hemorrhage. Maternal complications including gestational diabetes and gestational hypertension/preeclampsia are more common.  I discussed the mode of delivery that is based on the presentations.  If both have Vx/Vx or Vx/non-vertex presentations, vaginal delivery may be considered. In Vx/non-vx, vaginal delivery followed by internal podalic version of second twin will achieve vaginal delivery. In non-vx first twin presentation, cesarean delivery will be performed.  I emphasized the importance of weight gain (24 lbs by 24 weeks) to improve fetal weight and decrease the chances of preterm delivery. Low-dose aspirin is beneficial in preventing preeclampsia. Twin pregnancy is at high risk for preeclampsia.  I encouraged her to continue low-dose aspirin till delivery.  Recommendations -An appointment was made for her to return in 4 weeks for completion of fetal anatomy and fetal growth assessment. -Serial fetal growth assessments every 4 weeks. -Weekly antenatal testing at 36 and 37 weeks. -Delivery at 38 weeks.   Thank you for consultation.  If you have any questions or concerns, please contact me the [facility].  Consultation including face-to-face (more than 50%) counseling 30 minutes.

## 2021-06-20 ENCOUNTER — Other Ambulatory Visit: Payer: Self-pay | Admitting: *Deleted

## 2021-06-20 DIAGNOSIS — O30042 Twin pregnancy, dichorionic/diamniotic, second trimester: Secondary | ICD-10-CM

## 2021-07-19 ENCOUNTER — Ambulatory Visit: Payer: 59 | Admitting: *Deleted

## 2021-07-19 ENCOUNTER — Encounter: Payer: Self-pay | Admitting: *Deleted

## 2021-07-19 ENCOUNTER — Other Ambulatory Visit: Payer: Self-pay | Admitting: Obstetrics and Gynecology

## 2021-07-19 ENCOUNTER — Other Ambulatory Visit: Payer: Self-pay

## 2021-07-19 ENCOUNTER — Other Ambulatory Visit: Payer: Self-pay | Admitting: *Deleted

## 2021-07-19 ENCOUNTER — Ambulatory Visit: Payer: 59 | Attending: Obstetrics and Gynecology

## 2021-07-19 VITALS — BP 117/61 | HR 86

## 2021-07-19 DIAGNOSIS — Z862 Personal history of diseases of the blood and blood-forming organs and certain disorders involving the immune mechanism: Secondary | ICD-10-CM

## 2021-07-19 DIAGNOSIS — O09812 Supervision of pregnancy resulting from assisted reproductive technology, second trimester: Secondary | ICD-10-CM | POA: Diagnosis not present

## 2021-07-19 DIAGNOSIS — O30042 Twin pregnancy, dichorionic/diamniotic, second trimester: Secondary | ICD-10-CM

## 2021-07-19 DIAGNOSIS — O30043 Twin pregnancy, dichorionic/diamniotic, third trimester: Secondary | ICD-10-CM

## 2021-07-19 DIAGNOSIS — Z3A22 22 weeks gestation of pregnancy: Secondary | ICD-10-CM

## 2021-07-19 DIAGNOSIS — O365921 Maternal care for other known or suspected poor fetal growth, second trimester, fetus 1: Secondary | ICD-10-CM | POA: Diagnosis not present

## 2021-08-09 ENCOUNTER — Other Ambulatory Visit: Payer: Self-pay | Admitting: *Deleted

## 2021-08-09 ENCOUNTER — Ambulatory Visit: Payer: 59 | Admitting: *Deleted

## 2021-08-09 ENCOUNTER — Other Ambulatory Visit: Payer: Self-pay | Admitting: Obstetrics

## 2021-08-09 ENCOUNTER — Encounter: Payer: Self-pay | Admitting: *Deleted

## 2021-08-09 ENCOUNTER — Ambulatory Visit: Payer: 59 | Attending: Obstetrics

## 2021-08-09 ENCOUNTER — Other Ambulatory Visit: Payer: Self-pay

## 2021-08-09 VITALS — BP 113/66 | HR 85

## 2021-08-09 DIAGNOSIS — O30042 Twin pregnancy, dichorionic/diamniotic, second trimester: Secondary | ICD-10-CM

## 2021-08-09 DIAGNOSIS — O285 Abnormal chromosomal and genetic finding on antenatal screening of mother: Secondary | ICD-10-CM

## 2021-08-09 DIAGNOSIS — O09812 Supervision of pregnancy resulting from assisted reproductive technology, second trimester: Secondary | ICD-10-CM

## 2021-08-09 DIAGNOSIS — O365921 Maternal care for other known or suspected poor fetal growth, second trimester, fetus 1: Secondary | ICD-10-CM

## 2021-08-09 DIAGNOSIS — Z3A25 25 weeks gestation of pregnancy: Secondary | ICD-10-CM

## 2021-08-20 ENCOUNTER — Telehealth: Payer: Self-pay

## 2021-08-20 NOTE — Telephone Encounter (Signed)
Spoke with patient - was able to move her ultrasound appointment to a later time - due to patients work schedule ?

## 2021-08-21 ENCOUNTER — Other Ambulatory Visit: Payer: Self-pay | Admitting: Obstetrics

## 2021-08-21 DIAGNOSIS — O365921 Maternal care for other known or suspected poor fetal growth, second trimester, fetus 1: Secondary | ICD-10-CM

## 2021-08-21 DIAGNOSIS — O30042 Twin pregnancy, dichorionic/diamniotic, second trimester: Secondary | ICD-10-CM

## 2021-08-21 LAB — OB RESULTS CONSOLE RPR: RPR: NONREACTIVE

## 2021-08-22 ENCOUNTER — Ambulatory Visit: Payer: 59 | Admitting: *Deleted

## 2021-08-22 ENCOUNTER — Ambulatory Visit: Payer: 59 | Attending: Obstetrics

## 2021-08-22 VITALS — BP 114/69 | HR 92

## 2021-08-22 DIAGNOSIS — O285 Abnormal chromosomal and genetic finding on antenatal screening of mother: Secondary | ICD-10-CM | POA: Diagnosis not present

## 2021-08-22 DIAGNOSIS — O365921 Maternal care for other known or suspected poor fetal growth, second trimester, fetus 1: Secondary | ICD-10-CM | POA: Insufficient documentation

## 2021-08-22 DIAGNOSIS — Z3A27 27 weeks gestation of pregnancy: Secondary | ICD-10-CM

## 2021-08-22 DIAGNOSIS — O30042 Twin pregnancy, dichorionic/diamniotic, second trimester: Secondary | ICD-10-CM | POA: Insufficient documentation

## 2021-08-22 DIAGNOSIS — O30049 Twin pregnancy, dichorionic/diamniotic, unspecified trimester: Secondary | ICD-10-CM | POA: Diagnosis present

## 2021-08-22 DIAGNOSIS — D573 Sickle-cell trait: Secondary | ICD-10-CM

## 2021-08-22 DIAGNOSIS — O09812 Supervision of pregnancy resulting from assisted reproductive technology, second trimester: Secondary | ICD-10-CM

## 2021-08-28 ENCOUNTER — Telehealth: Payer: Self-pay | Admitting: Hematology and Oncology

## 2021-08-28 NOTE — Telephone Encounter (Signed)
Scheduled appt per 4/12 referral. Pt is aware of appt date and time. Pt is aware to arrive 15 mins prior to appt time and to bring and updated insurance card. Pt is aware of appt location.   ?

## 2021-08-30 ENCOUNTER — Other Ambulatory Visit: Payer: Self-pay | Admitting: Obstetrics

## 2021-08-30 ENCOUNTER — Ambulatory Visit (HOSPITAL_BASED_OUTPATIENT_CLINIC_OR_DEPARTMENT_OTHER): Payer: 59 | Admitting: Maternal & Fetal Medicine

## 2021-08-30 ENCOUNTER — Ambulatory Visit (HOSPITAL_BASED_OUTPATIENT_CLINIC_OR_DEPARTMENT_OTHER): Payer: 59

## 2021-08-30 ENCOUNTER — Ambulatory Visit (HOSPITAL_BASED_OUTPATIENT_CLINIC_OR_DEPARTMENT_OTHER): Payer: 59 | Admitting: *Deleted

## 2021-08-30 ENCOUNTER — Inpatient Hospital Stay (HOSPITAL_COMMUNITY)
Admission: AD | Admit: 2021-08-30 | Discharge: 2021-08-30 | Disposition: A | Discharge status: Home / Self Care | Payer: 59 | Attending: Obstetrics and Gynecology | Admitting: Obstetrics and Gynecology

## 2021-08-30 ENCOUNTER — Other Ambulatory Visit: Payer: Self-pay

## 2021-08-30 ENCOUNTER — Ambulatory Visit: Payer: 59 | Admitting: *Deleted

## 2021-08-30 VITALS — BP 115/69 | HR 100

## 2021-08-30 DIAGNOSIS — O365921 Maternal care for other known or suspected poor fetal growth, second trimester, fetus 1: Secondary | ICD-10-CM

## 2021-08-30 DIAGNOSIS — O30043 Twin pregnancy, dichorionic/diamniotic, third trimester: Secondary | ICD-10-CM | POA: Insufficient documentation

## 2021-08-30 DIAGNOSIS — O09813 Supervision of pregnancy resulting from assisted reproductive technology, third trimester: Secondary | ICD-10-CM

## 2021-08-30 DIAGNOSIS — O30042 Twin pregnancy, dichorionic/diamniotic, second trimester: Secondary | ICD-10-CM

## 2021-08-30 DIAGNOSIS — O365923 Maternal care for other known or suspected poor fetal growth, second trimester, fetus 3: Secondary | ICD-10-CM

## 2021-08-30 DIAGNOSIS — Z3A28 28 weeks gestation of pregnancy: Secondary | ICD-10-CM

## 2021-08-30 DIAGNOSIS — O365931 Maternal care for other known or suspected poor fetal growth, third trimester, fetus 1: Secondary | ICD-10-CM

## 2021-08-30 DIAGNOSIS — O285 Abnormal chromosomal and genetic finding on antenatal screening of mother: Secondary | ICD-10-CM

## 2021-08-30 DIAGNOSIS — Z3689 Encounter for other specified antenatal screening: Secondary | ICD-10-CM | POA: Diagnosis present

## 2021-08-30 DIAGNOSIS — O365990 Maternal care for other known or suspected poor fetal growth, unspecified trimester, not applicable or unspecified: Secondary | ICD-10-CM

## 2021-08-30 DIAGNOSIS — O365930 Maternal care for other known or suspected poor fetal growth, third trimester, not applicable or unspecified: Secondary | ICD-10-CM | POA: Insufficient documentation

## 2021-08-30 DIAGNOSIS — D573 Sickle-cell trait: Secondary | ICD-10-CM

## 2021-08-30 MED ORDER — LACTATED RINGERS IV BOLUS
1000.0000 mL | Freq: Once | INTRAVENOUS | Status: AC
  Administered 2021-08-30: 1000 mL via INTRAVENOUS

## 2021-08-30 NOTE — Progress Notes (Signed)
MFM Brief Note  Nancy Hunter is here for follow up growth and antenatal testing due to Davidson twins with FGR in Twin A.  She is seen at the request of Dr. Drema Dallas  Twin A normal stomach, amniotic fluid, bladder EFW 6th% - Maternal right, Cephalic Twin B normal stomach, amniotic fluid, bladder and EFW 12%- Maternal Left, Breech UA Dopplers are normal without evidence of AEDF or REDF. NSTx 2 were non reactive. There were no deceleartions noted.  I discussed today's findings with Ms. Djordjevic and recommended further monitoring at the MAU.  I discussed the plan of care with Gaylan Gerold, CNM.  All questions answered  I spent 20 minutes with > 50% in face to face consultation.  Vikki Ports, MD

## 2021-08-30 NOTE — Procedures (Signed)
Nancy Hunter ?August 10, 1990 ?[redacted]w[redacted]d ? ? ?Fetus B Non-Stress Test Interpretation for 08/30/21 ? ?Indication:  [employer] twins ? ?Fetal Heart Rate Fetus B ?Mode: External ?Baseline Rate (B): 160 BPM ?Variability: Minimal, Moderate ?Accelerations: None ?Decelerations: None ? ?Uterine Activity ?Mode: Palpation, Toco ?Contraction Frequency (min): none ?Resting Tone Palpated: Relaxed ? ?Interpretation (Baby B - Fetal Testing) ?Nonstress Test Interpretation (Baby B): Non-reactive ?Overall Impression (Baby B): Reassuring for gestational age ?Comments (Baby B): Dr. Grace Bushy reviewed tracing. Patient sent to [facility] MAU for prolonged monitoring ? ?Nancy Hunter ?16-Jan-1991 ?[redacted]w[redacted]d ? ?Fetus A Non-Stress Test Interpretation for 08/30/21 ? ?Indication: IUGR ? ?Fetal Heart Rate A ?Mode: External ?Baseline Rate (A): 140 bpm ?Variability: Minimal, Moderate ?Accelerations: None ?Decelerations: None ?Multiple birth?: Yes ? ?Uterine Activity ?Mode: Palpation, Toco ?Contraction Frequency (min): none ?Resting Tone Palpated: Relaxed ? ?Interpretation (Fetal Testing) ?Nonstress Test Interpretation: Non-reactive ?Overall Impression: Reassuring for gestational age ?Comments: Dr. Grace Bushy reviewed tracing. ? ? ?

## 2021-08-30 NOTE — MAU Note (Signed)
Nancy Hunter is a 31 y.o. at [redacted]w[redacted]d here in MAU reporting: went to MFM appt, during her NST- didn't get the movement  they wanted to see.  Sent over for further monitoring. Had not eaten. Denies pain , bleeding or leaking.  ? ?Onset of complaint: today ?Pain score: none ?Vitals:  ? 08/30/21 1838  ?BP: 126/69  ?Pulse: 99  ?Resp: 17  ?Temp: 98.9 ?F (37.2 ?C)  ?SpO2: 100%  ?   ?FHT:150 RLQ, 152 LUQ ?Lab orders placed from triage:  none ?

## 2021-08-30 NOTE — Discharge Instructions (Signed)
Follow up with MFM as scheduled for continued growth assessments. ?

## 2021-08-30 NOTE — MAU Provider Note (Signed)
Event Date/Time  ? First Provider Initiated Contact with Patient 08/30/21 1947   ?  ?S ?[name] is a 31 y.o. [phone] pregnant female at [redacted]w[redacted]d who presents to MAU today per MFM for extended monitoring. She was being seen at MFM today for a growth scan which showed fetal growth restriction of her di-di twins and minimal reactivity was noted. Dr. Grace Bushy requested she be evaluated in MAU for extended monitoring. She denies contractions, cramping, loss of fluid, decreased fetal movement or any other physical symptoms.  ? ?O ?BP 126/69 (BP Location: Right Arm)   Pulse 99   Temp 98.9 ?F (37.2 ?C) (Oral)   Resp 17   Ht 5\' 5"  (1.651 m)   Wt 162 lb 6.4 oz (73.7 kg)   LMP 02/17/2021   SpO2 100%   BMI 27.02 kg/m?  ?Physical Exam ?Vitals and nursing note reviewed.  ?Constitutional:   ?   Appearance: Normal appearance. She is normal weight.  ?HENT:  ?   Head: Normocephalic and atraumatic.  ?Eyes:  ?   Pupils: Pupils are equal, round, and reactive to light.  ?Cardiovascular:  ?   Rate and Rhythm: Normal rate and regular rhythm.  ?   Pulses: Normal pulses.  ?Pulmonary:  ?   Effort: Pulmonary effort is normal.  ?Abdominal:  ?   Palpations: Abdomen is soft.  ?Musculoskeletal:     ?   General: Normal range of motion.  ?Skin: ?   General: Skin is warm.  ?   Capillary Refill: Capillary refill takes less than 2 seconds.  ?Neurological:  ?   Mental Status: She is alert and oriented to person, place, and time.  ?Psychiatric:     ?   Mood and Affect: Mood normal.     ?   Behavior: Behavior normal.     ?   Thought Content: Thought content normal.     ?   Judgment: Judgment normal.  ? ?Fetal Tracing: initially non-reassuring but improved to reactive after fluids ?Baseline: 150/155 ?Variability: moderate ?Accelerations: 10x10 to 15x15, appropriate for gestational age ?Decelerations: none ?Toco: occasional, not felt by pt ? ?MAU Course/MDM: ?When patient arrived, the babies were both still non-reactive so LR bolus ordered. Pt  had eaten on the way here, also encouraged her to drink some juice. After the bolus and juice, babies improved to reactive and were VERY physically active noted by pt and this CNM at bedside. Discussed presentation and findings with Dr. 04/19/2021 who agreed pt was stable for discharge with follow up in MFM as scheduled. Will route note to [facility]. ? ?A ?NST reactive ?Fetal growth restriction in 3rd trimester ?[redacted] weeks gestation of pregnancy ? ?P ?Discharge from MAU in stable condition with return precaution ?Follow up at [facility] and MFM as scheduled for ongoing prenatal care and growth surveillance ? ? ?[facility], CNM ?08/30/2021 10:35 PM  ? ?

## 2021-09-02 ENCOUNTER — Other Ambulatory Visit: Payer: Self-pay | Admitting: *Deleted

## 2021-09-02 DIAGNOSIS — O30043 Twin pregnancy, dichorionic/diamniotic, third trimester: Secondary | ICD-10-CM

## 2021-09-02 DIAGNOSIS — O365931 Maternal care for other known or suspected poor fetal growth, third trimester, fetus 1: Secondary | ICD-10-CM

## 2021-09-06 ENCOUNTER — Ambulatory Visit: Payer: 59 | Attending: Obstetrics

## 2021-09-06 ENCOUNTER — Ambulatory Visit (HOSPITAL_BASED_OUTPATIENT_CLINIC_OR_DEPARTMENT_OTHER): Payer: 59 | Admitting: *Deleted

## 2021-09-06 ENCOUNTER — Ambulatory Visit: Payer: 59 | Admitting: *Deleted

## 2021-09-06 VITALS — BP 110/62 | HR 91

## 2021-09-06 DIAGNOSIS — Z3A29 29 weeks gestation of pregnancy: Secondary | ICD-10-CM

## 2021-09-06 DIAGNOSIS — O365931 Maternal care for other known or suspected poor fetal growth, third trimester, fetus 1: Secondary | ICD-10-CM

## 2021-09-06 DIAGNOSIS — O30043 Twin pregnancy, dichorionic/diamniotic, third trimester: Secondary | ICD-10-CM | POA: Diagnosis present

## 2021-09-06 DIAGNOSIS — O30042 Twin pregnancy, dichorionic/diamniotic, second trimester: Secondary | ICD-10-CM | POA: Insufficient documentation

## 2021-09-06 DIAGNOSIS — O285 Abnormal chromosomal and genetic finding on antenatal screening of mother: Secondary | ICD-10-CM | POA: Diagnosis not present

## 2021-09-06 DIAGNOSIS — O09813 Supervision of pregnancy resulting from assisted reproductive technology, third trimester: Secondary | ICD-10-CM

## 2021-09-06 DIAGNOSIS — O365921 Maternal care for other known or suspected poor fetal growth, second trimester, fetus 1: Secondary | ICD-10-CM | POA: Diagnosis not present

## 2021-09-06 DIAGNOSIS — D573 Sickle-cell trait: Secondary | ICD-10-CM

## 2021-09-06 NOTE — Procedures (Signed)
Nancy Hunter ?05-Nov-1990 ?[redacted]w[redacted]d ? ?Fetus A Non-Stress Test Interpretation for 09/06/21 ? ?Indication: IUGR and Di/Di twins ? ?Fetal Heart Rate A ?Mode: External ?Baseline Rate (A): 150 bpm ?Variability: Moderate ?Accelerations: 10 x 10 ?Decelerations: None ?Multiple birth?: Yes ? ?Uterine Activity ?Mode: Palpation, Toco ?Contraction Frequency (min): Occas w/UI ?Contraction Quality: Mild ?Resting Tone Palpated: Relaxed ?Resting Time: Adequate ? ?Interpretation (Fetal Testing) ?Nonstress Test Interpretation: Reactive ?Overall Impression: Reassuring for gestational age ?Comments: Dr. Parke Poisson reviewed tracing. ? ?Nancy Hunter ?10/13/1990 ?[redacted]w[redacted]d ? ? ?Fetus B Non-Stress Test Interpretation for 09/06/21 ? ?Indication:  Di/Di twins ? ?Fetal Heart Rate Fetus B ?Mode: External ?Baseline Rate (B): 150 BPM ?Variability: Moderate ?Accelerations: 10 x 10 ?Decelerations: None ? ?Uterine Activity ?Mode: Palpation, Toco ?Contraction Frequency (min): Occas w/UI ?Contraction Quality: Mild ?Resting Tone Palpated: Relaxed ?Resting Time: Adequate ? ?Interpretation (Baby B - Fetal Testing) ?Nonstress Test Interpretation (Baby B): Reactive ?Overall Impression (Baby B): Reassuring for gestational age ?Comments (Baby B): Dr. Parke Poisson reviewed tracing. ? ? ?

## 2021-09-10 ENCOUNTER — Inpatient Hospital Stay: Payer: 59 | Attending: Hematology and Oncology | Admitting: Hematology and Oncology

## 2021-09-10 ENCOUNTER — Other Ambulatory Visit: Payer: Self-pay

## 2021-09-10 ENCOUNTER — Encounter: Payer: Self-pay | Admitting: Hematology and Oncology

## 2021-09-10 ENCOUNTER — Inpatient Hospital Stay: Payer: 59

## 2021-09-10 DIAGNOSIS — Z87891 Personal history of nicotine dependence: Secondary | ICD-10-CM | POA: Diagnosis not present

## 2021-09-10 DIAGNOSIS — D509 Iron deficiency anemia, unspecified: Secondary | ICD-10-CM | POA: Diagnosis present

## 2021-09-10 DIAGNOSIS — Z3A30 30 weeks gestation of pregnancy: Secondary | ICD-10-CM | POA: Insufficient documentation

## 2021-09-10 DIAGNOSIS — D508 Other iron deficiency anemias: Secondary | ICD-10-CM | POA: Diagnosis not present

## 2021-09-10 DIAGNOSIS — O99013 Anemia complicating pregnancy, third trimester: Secondary | ICD-10-CM | POA: Diagnosis present

## 2021-09-10 DIAGNOSIS — O26893 Other specified pregnancy related conditions, third trimester: Secondary | ICD-10-CM | POA: Insufficient documentation

## 2021-09-10 NOTE — Progress Notes (Signed)
[facility] ?CONSULT NOTE ? ?Patient Care Team: ?Shirline Frees, NP as PCP - General (Family Medicine) ? ?CHIEF COMPLAINTS/PURPOSE OF CONSULTATION:  ?IDA ? ?ASSESSMENT & PLAN:  ? ?IDA (iron deficiency anemia) ?This is a very pleasant 31 year old female patient currently 30 weeks of gestation referred to hematology for consideration of IV iron for iron deficiency anemia.  Patient denies any new health complaints except for ongoing fatigue, feeling cold and episodes of dizziness.  She does not know if the symptoms could be attributed to her pregnancy versus iron deficiency.  She has been taking oral iron twice a day and has been tolerating it well.  We have discussed that Venofer has been studied in iron deficiency anemia during pregnancy however due to limited safety data we generally recommend IV iron in the second or third trimester.  She must know that there are no large control studies using IV iron in pregnant women.  She agrees to proceed with it. ? ?Iron deficiency anemia affects a large proportion of the wellness population especially females of childbearing age and children.  Common causes of iron deficiency include blood loss, reduced iron absorption because of previous surgeries, dietary restrictions or other malabsorption issues, medications that reduce gastric acidity are due to inherited disorders such as IRIDA due to TMPRSS6 mutation.  In her case her most likely cause of iron deficiency is high demand state, since she is pregnant with twins. ? ?We have discussed about risk of allergic/infusion reactions including potentially life-threatening anaphylaxis with intravenous iron however these serious allergic reactions are exceedingly rare and overestimated.  In contrast to serious allergic reactions, IV iron may be associated with nonallergic infusion reactions including self-limited urticaria, palpitations, dizziness, neck and back spasm which again occur in less than 1% of the individuals  and do not progress to more serious reactions. ? ?Venofer orders have been placed.  She will return to clinic end of May with repeat labs. ? ? ? ? ? ? ? ? ?HISTORY OF PRESENTING ILLNESS:  ?Nancy Hunter 31 y.o. female is here because of IDA ? ?This is a very pleasant 31 year old female patient with no significant past medical history currently pregnant with twins at about 30 weeks of gestation referred to hematology for consideration of intravenous iron given ongoing iron deficiency anemia.  Patient was apparently started on oral iron twice a day but continues to have severe anemia with hemoglobin of 9.4.  Her gynecologist was wondering if he would consider IV iron since she may have further drop in her hemoglobin when she is getting ready to deliver. ?Patient complains of fatigue, feeling cold, some lightheadedness/dizziness but does not know if these could be symptoms of iron deficiency versus pregnancy. ?She denies any pica, brittle nails or hair loss.  She denies any blood loss per rectum. ?Rest of the pertinent 10 point ROS reviewed and negative. ? ?REVIEW OF SYSTEMS:   ?Constitutional: Denies fevers, chills or abnormal night sweats ?Eyes: Denies blurriness of vision, double vision or watery eyes ?Ears, nose, mouth, throat, and face: Denies mucositis or sore throat ?Respiratory: Denies cough, dyspnea or wheezes ?Cardiovascular: Denies palpitation, chest discomfort or lower extremity swelling ?Gastrointestinal:  Denies nausea, heartburn or change in bowel habits ?Skin: Denies abnormal skin rashes ?Lymphatics: Denies new lymphadenopathy or easy bruising ?Neurological:Denies numbness, tingling or new weaknesses ?Behavioral/Psych: Mood is stable, no new changes  ?All other systems were reviewed with the patient and are negative. ? ?MEDICAL HISTORY:  ?Past Medical History:  ?Diagnosis Date  ?  Anemia   ? Anxiety   ? Chronic back pain   ? Depression   ? PCOS (polycystic ovarian syndrome)   ? ? ?SURGICAL HISTORY: ?Past  Surgical History:  ?Procedure Laterality Date  ? COLPOSCOPY    ? ECTOPIC PREGNANCY SURGERY  04/09/2016  ? WISDOM TOOTH EXTRACTION    ? ? ?SOCIAL HISTORY: ?Social History  ? ?Socioeconomic History  ? Marital status: Significant Other  ?  Spouse name: Not on file  ? Number of children: Not on file  ? Years of education: Not on file  ? Highest education level: Not on file  ?Occupational History  ? Not on file  ?Tobacco Use  ? Smoking status: Former  ?  Types: Cigarettes  ?  Quit date: 10/24/2020  ?  Years since quitting: 0.8  ? Smokeless tobacco: Never  ?Vaping Use  ? Vaping Use: Former  ? Quit date: 10/24/2020  ?Substance and Sexual Activity  ? Alcohol use: Not Currently  ?  Comment: not while preg  ? Drug use: No  ? Sexual activity: Yes  ?  Birth control/protection: None  ?Other Topics Concern  ? Not on file  ?Social History Narrative  ? 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 Engaged  ? ?Social Determinants of Health  ? ?Financial Resource Strain: Not on file  ?Food Insecurity: Not on file  ?Transportation Needs: Not on file  ?Physical Activity: Not on file  ?Stress: Not on file  ?Social Connections: Not on file  ?Intimate Partner Violence: Not on file  ? ? ?FAMILY HISTORY: ?Family History  ?Problem Relation Age of Onset  ? Hypertension Maternal Grandmother   ? Hyperlipidemia Maternal Grandmother   ? Arthritis Maternal Grandmother   ? Diabetes Maternal Grandfather   ? Hypertension Maternal Grandfather   ? ? ?ALLERGIES:  is allergic to latex. ? ?MEDICATIONS:  ?Current Outpatient Medications  ?Medication Sig Dispense Refill  ? aspirin EC 81 MG tablet Take 81 mg by mouth daily. Swallow whole.    ? docusate sodium (COLACE) 100 MG capsule Take 100 mg by mouth 2 (two) times daily.    ? ferrous sulfate 325 (65 FE) MG tablet Take 325 mg by mouth 2 (two) times daily with a meal.    ? folic acid (FOLVITE) 800 MCG tablet Take 400 mcg by mouth daily.    ? Prenatal Vit-Fe Fumarate-FA (PRENATAL MULTIVITAMIN) TABS tablet Take 1 tablet by mouth  daily at 12 noon.    ? ?No current facility-administered medications for this visit.  ? ? ? ?PHYSICAL EXAMINATION: ?ECOG PERFORMANCE STATUS: 0 - Asymptomatic ? ?Vitals:  ? 09/10/21 1116  ?BP: 113/60  ?Pulse: 89  ?Resp: 18  ?Temp: 97.9 ?F (36.6 ?C)  ?SpO2: 100%  ? ?Filed Weights  ? 09/10/21 1116  ?Weight: 162 lb 3.2 oz (73.6 kg)  ? ? ?GENERAL:alert, no distress and comfortable ?SKIN: skin color, texture, turgor are normal, no rashes or significant lesions ?EYES: normal, conjunctiva are pink and non-injected, sclera clear ?OROPHARYNX:no exudate, no erythema and lips, buccal mucosa, and tongue normal  ?NECK: supple, thyroid normal size, non-tender, without nodularity ?LYMPH:  no palpable lymphadenopathy in the cervical, axillary or inguinal ?LUNGS: clear to auscultation and percussion with normal breathing effort ?HEART: regular rate & rhythm and no murmurs and no lower extremity edema ?ABDOMEN: Gravid uterus ?Musculoskeletal:no cyanosis of digits and no clubbing  ?PSYCH: alert & oriented x 3 with fluent speech ?NEURO: no focal motor/sensory deficits ? ?LABORATORY DATA:  ?I have reviewed the  data as listed ?Lab Results  ?Component Value Date  ? WBC 19.4 (H) 05/18/2018  ? HGB 9.9 (L) 05/18/2018  ? HCT 29.2 (L) 05/18/2018  ? MCV 81.8 05/18/2018  ? PLT 153 05/18/2018  ? ?  Chemistry   ?   ?Component Value Date/Time  ? NA 138 01/04/2016 1628  ? K 4.2 01/04/2016 1628  ? CL 99 01/04/2016 1628  ? CO2 27 01/04/2016 1628  ? BUN 15 01/04/2016 1628  ? CREATININE 0.94 01/04/2016 1628  ?    ?Component Value Date/Time  ? CALCIUM 10.0 01/04/2016 1628  ? ALKPHOS 54 01/05/2015 1106  ? AST 20 01/05/2015 1106  ? ALT 14 01/05/2015 1106  ? BILITOT 0.3 01/05/2015 1106  ?  ? ? ? ?RADIOGRAPHIC STUDIES: ?I have personally reviewed the radiological images as listed and agreed with the findings in the report. ?US MFM OB FOLLOW UP ? ?Result Date: 08/30/2021 ?----------------------------------------------------------------------  OBSTETRICS REPORT                        (Signed Final 08/30/2021 04:56 pm) ---------------------------------------------------------------------- Patient Info  ID #:       [id number]                          D.O.B.:

## 2021-09-10 NOTE — Assessment & Plan Note (Addendum)
This is a very pleasant 31 year old female patient currently 30 weeks of gestation referred to hematology for consideration of IV iron for iron deficiency anemia.  Patient denies any new health complaints except for ongoing fatigue, feeling cold and episodes of dizziness.  She does not know if the symptoms could be attributed to her pregnancy versus iron deficiency.  She has been taking oral iron twice a day and has been tolerating it well.  We have discussed that Venofer has been studied in iron deficiency anemia during pregnancy however due to limited safety data we generally recommend IV iron in the second or third trimester.  She must know that there are no large control studies using IV iron in pregnant women.  She agrees to proceed with it. ? ?Iron deficiency anemia affects a large proportion of the wellness population especially females of childbearing age and children.  Common causes of iron deficiency include blood loss, reduced iron absorption because of previous surgeries, dietary restrictions or other malabsorption issues, medications that reduce gastric acidity are due to inherited disorders such as IRIDA due to TMPRSS6 mutation.  In her case her most likely cause of iron deficiency is high demand state, since she is pregnant with twins. ? ?We have discussed about risk of allergic/infusion reactions including potentially life-threatening anaphylaxis with intravenous iron however these serious allergic reactions are exceedingly rare and overestimated.  In contrast to serious allergic reactions, IV iron may be associated with nonallergic infusion reactions including self-limited urticaria, palpitations, dizziness, neck and back spasm which again occur in less than 1% of the individuals and do not progress to more serious reactions. ? ?Venofer orders have been placed.  She will return to clinic end of May with repeat labs. ? ? ? ? ? ? ?

## 2021-09-11 ENCOUNTER — Other Ambulatory Visit: Payer: Self-pay | Admitting: Maternal & Fetal Medicine

## 2021-09-11 ENCOUNTER — Ambulatory Visit: Payer: 59 | Attending: Maternal & Fetal Medicine

## 2021-09-11 ENCOUNTER — Ambulatory Visit: Payer: 59 | Admitting: *Deleted

## 2021-09-11 VITALS — BP 106/62 | HR 77

## 2021-09-11 DIAGNOSIS — Z3A30 30 weeks gestation of pregnancy: Secondary | ICD-10-CM

## 2021-09-11 DIAGNOSIS — O30043 Twin pregnancy, dichorionic/diamniotic, third trimester: Secondary | ICD-10-CM | POA: Diagnosis present

## 2021-09-11 DIAGNOSIS — O365931 Maternal care for other known or suspected poor fetal growth, third trimester, fetus 1: Secondary | ICD-10-CM

## 2021-09-11 DIAGNOSIS — O09813 Supervision of pregnancy resulting from assisted reproductive technology, third trimester: Secondary | ICD-10-CM | POA: Diagnosis not present

## 2021-09-11 DIAGNOSIS — O285 Abnormal chromosomal and genetic finding on antenatal screening of mother: Secondary | ICD-10-CM | POA: Diagnosis not present

## 2021-09-11 DIAGNOSIS — D573 Sickle-cell trait: Secondary | ICD-10-CM

## 2021-09-11 NOTE — Procedures (Signed)
Nancy Hunter ?10-26-1990 ?[redacted]w[redacted]d ? ? ?Fetus B Non-Stress Test Interpretation for 09/11/21 ? ?Indication:  DI DI TWINS ? ?Fetal Heart Rate Fetus B ?Mode: External ?Baseline Rate (B): 150 BPM ?Variability: Moderate ?Accelerations: 10 x 10 ?Decelerations: Variable ? ?Uterine Activity ?Mode: Palpation, Toco ?Contraction Frequency (min): none ?Resting Tone Palpated: Relaxed ? ?Interpretation (Baby B - Fetal Testing) ?Nonstress Test Interpretation (Baby B): Reactive ?Overall Impression (Baby B): Reassuring for gestational age ?Comments (Baby B): Dr. Annamaria Boots reviewed tracing ? ?[name] ?[birth year] ?100w3d ? ?Fetus A Non-Stress Test Interpretation for 09/11/21 ? ?Indication: IUGR ? ?Fetal Heart Rate A ?Mode: External ?Baseline Rate (A): 140 bpm ?Variability: Minimal, Moderate ?Accelerations: None ?Decelerations: None ?Multiple birth?: Yes ? ?Uterine Activity ?Mode: Palpation, Toco ?Contraction Frequency (min): none ?Resting Tone Palpated: Relaxed ? ?Interpretation (Fetal Testing) ?Nonstress Test Interpretation: Non-reactive ?Overall Impression: Reassuring for gestational age ?Comments: Dr. Annamaria Boots reviewed tracing. BPP exam added to [place]. ? ? ?

## 2021-09-18 ENCOUNTER — Other Ambulatory Visit: Payer: Self-pay | Admitting: *Deleted

## 2021-09-18 ENCOUNTER — Ambulatory Visit: Payer: 59 | Attending: Maternal & Fetal Medicine

## 2021-09-18 ENCOUNTER — Encounter: Payer: Self-pay | Admitting: *Deleted

## 2021-09-18 ENCOUNTER — Ambulatory Visit: Payer: 59 | Admitting: *Deleted

## 2021-09-18 ENCOUNTER — Ambulatory Visit: Payer: 59

## 2021-09-18 VITALS — BP 117/59 | HR 87

## 2021-09-18 DIAGNOSIS — O09813 Supervision of pregnancy resulting from assisted reproductive technology, third trimester: Secondary | ICD-10-CM | POA: Diagnosis not present

## 2021-09-18 DIAGNOSIS — Z3A31 31 weeks gestation of pregnancy: Secondary | ICD-10-CM

## 2021-09-18 DIAGNOSIS — O285 Abnormal chromosomal and genetic finding on antenatal screening of mother: Secondary | ICD-10-CM | POA: Diagnosis not present

## 2021-09-18 DIAGNOSIS — O30043 Twin pregnancy, dichorionic/diamniotic, third trimester: Secondary | ICD-10-CM | POA: Diagnosis not present

## 2021-09-18 DIAGNOSIS — O365931 Maternal care for other known or suspected poor fetal growth, third trimester, fetus 1: Secondary | ICD-10-CM

## 2021-09-18 DIAGNOSIS — D573 Sickle-cell trait: Secondary | ICD-10-CM

## 2021-09-20 ENCOUNTER — Inpatient Hospital Stay: Payer: 59 | Attending: Hematology and Oncology

## 2021-09-20 ENCOUNTER — Other Ambulatory Visit: Payer: Self-pay

## 2021-09-20 VITALS — BP 113/66 | HR 89 | Temp 98.1°F | Resp 18

## 2021-09-20 DIAGNOSIS — Z3A35 35 weeks gestation of pregnancy: Secondary | ICD-10-CM | POA: Insufficient documentation

## 2021-09-20 DIAGNOSIS — O99013 Anemia complicating pregnancy, third trimester: Secondary | ICD-10-CM | POA: Insufficient documentation

## 2021-09-20 DIAGNOSIS — D508 Other iron deficiency anemias: Secondary | ICD-10-CM

## 2021-09-20 DIAGNOSIS — D509 Iron deficiency anemia, unspecified: Secondary | ICD-10-CM | POA: Diagnosis present

## 2021-09-20 DIAGNOSIS — Z87891 Personal history of nicotine dependence: Secondary | ICD-10-CM | POA: Diagnosis not present

## 2021-09-20 MED ORDER — SODIUM CHLORIDE 0.9 % IV SOLN: Freq: Once | INTRAVENOUS | Status: AC

## 2021-09-20 MED ORDER — SODIUM CHLORIDE 0.9 % IV SOLN
200.0000 mg | Freq: Once | INTRAVENOUS | Status: AC
  Administered 2021-09-20: 200 mg via INTRAVENOUS
  Filled 2021-09-20: qty 200

## 2021-09-20 NOTE — Progress Notes (Signed)
Patient observed for 30 minutes following administration of venofer infusion. Vital signs retaken and remained stable. Patient showed no signs of distress upon discharge.  ?Fetal heart tones noted following procedure. Baby A: 151 BPM, Baby B: 147 BPM. ?

## 2021-09-20 NOTE — Progress Notes (Signed)
Fetal heart tones observed prior to trtmt: Nancy Hunter 146 and Nancy Hunter 153 ?

## 2021-09-20 NOTE — Progress Notes (Signed)
Orders signed.

## 2021-09-20 NOTE — Patient Instructions (Signed)

## 2021-09-24 ENCOUNTER — Inpatient Hospital Stay: Payer: 59

## 2021-09-24 ENCOUNTER — Other Ambulatory Visit: Payer: Self-pay

## 2021-09-24 VITALS — BP 103/77 | HR 85 | Temp 98.3°F | Resp 18

## 2021-09-24 DIAGNOSIS — D508 Other iron deficiency anemias: Secondary | ICD-10-CM

## 2021-09-24 DIAGNOSIS — O99013 Anemia complicating pregnancy, third trimester: Secondary | ICD-10-CM | POA: Diagnosis not present

## 2021-09-24 MED ORDER — SODIUM CHLORIDE 0.9 % IV SOLN: Freq: Once | INTRAVENOUS | Status: AC

## 2021-09-24 MED ORDER — SODIUM CHLORIDE 0.9 % IV SOLN
200.0000 mg | Freq: Once | INTRAVENOUS | Status: AC
  Administered 2021-09-24: 200 mg via INTRAVENOUS
  Filled 2021-09-24: qty 200

## 2021-09-24 NOTE — Patient Instructions (Signed)

## 2021-09-24 NOTE — Progress Notes (Signed)
Fetal heart tones noted prior to Venofer infusion today.  ?Baby A: 146 BPM ?Baby B: 157 BPM ?

## 2021-09-24 NOTE — Progress Notes (Signed)
Pt observed for 30 minutes post Venofer infusion. ?Pt tolerated trtmt well w/out incident. ?Fetal heart tones observed post infusion. Baby A: 150 BPM and Baby B: 157 BPM ?VSS at discharge.  ?Ambulatory to lobby.   ?

## 2021-09-25 ENCOUNTER — Other Ambulatory Visit: Payer: Self-pay | Admitting: *Deleted

## 2021-09-25 ENCOUNTER — Ambulatory Visit: Payer: 59 | Attending: Maternal & Fetal Medicine

## 2021-09-25 ENCOUNTER — Ambulatory Visit: Payer: 59 | Admitting: *Deleted

## 2021-09-25 VITALS — BP 115/64 | HR 97

## 2021-09-25 DIAGNOSIS — O285 Abnormal chromosomal and genetic finding on antenatal screening of mother: Secondary | ICD-10-CM | POA: Diagnosis not present

## 2021-09-25 DIAGNOSIS — O30043 Twin pregnancy, dichorionic/diamniotic, third trimester: Secondary | ICD-10-CM | POA: Diagnosis present

## 2021-09-25 DIAGNOSIS — O365931 Maternal care for other known or suspected poor fetal growth, third trimester, fetus 1: Secondary | ICD-10-CM

## 2021-09-25 DIAGNOSIS — D573 Sickle-cell trait: Secondary | ICD-10-CM

## 2021-09-25 DIAGNOSIS — Z3A32 32 weeks gestation of pregnancy: Secondary | ICD-10-CM

## 2021-09-25 DIAGNOSIS — O09819 Supervision of pregnancy resulting from assisted reproductive technology, unspecified trimester: Secondary | ICD-10-CM | POA: Diagnosis not present

## 2021-09-26 ENCOUNTER — Inpatient Hospital Stay: Payer: 59

## 2021-09-26 ENCOUNTER — Other Ambulatory Visit: Payer: Self-pay

## 2021-09-26 VITALS — BP 111/65 | HR 87 | Temp 98.7°F | Resp 17 | Wt 167.0 lb

## 2021-09-26 DIAGNOSIS — D508 Other iron deficiency anemias: Secondary | ICD-10-CM

## 2021-09-26 DIAGNOSIS — O99013 Anemia complicating pregnancy, third trimester: Secondary | ICD-10-CM | POA: Diagnosis not present

## 2021-09-26 MED ORDER — SODIUM CHLORIDE 0.9 % IV SOLN: Freq: Once | INTRAVENOUS | Status: AC

## 2021-09-26 MED ORDER — SODIUM CHLORIDE 0.9 % IV SOLN
200.0000 mg | Freq: Once | INTRAVENOUS | Status: AC
  Administered 2021-09-26: 200 mg via INTRAVENOUS
  Filled 2021-09-26: qty 200

## 2021-09-26 NOTE — Patient Instructions (Signed)

## 2021-09-26 NOTE — Progress Notes (Signed)
Pt observed for 30 min post iron infusion, discharged with VSS, ambulatory to lobby.  ?Fetal heart tones noted upon discharge: ?Baby A: 154 ?Baby B: 160 ? ?

## 2021-09-26 NOTE — Progress Notes (Signed)
Fetal heart tones noted prior to venofer infusion today:  ?Baby A: 148 ?Baby B: 159 ?

## 2021-09-27 MED FILL — Iron Sucrose Inj 20 MG/ML (Fe Equiv): INTRAVENOUS | Qty: 10 | Status: AC

## 2021-09-28 ENCOUNTER — Inpatient Hospital Stay: Payer: 59

## 2021-09-28 VITALS — BP 109/64 | HR 83 | Temp 98.4°F | Resp 17

## 2021-09-28 DIAGNOSIS — O99013 Anemia complicating pregnancy, third trimester: Secondary | ICD-10-CM | POA: Diagnosis not present

## 2021-09-28 DIAGNOSIS — D508 Other iron deficiency anemias: Secondary | ICD-10-CM

## 2021-09-28 MED ORDER — SODIUM CHLORIDE 0.9 % IV SOLN
200.0000 mg | Freq: Once | INTRAVENOUS | Status: AC
  Administered 2021-09-28: 200 mg via INTRAVENOUS
  Filled 2021-09-28: qty 200

## 2021-09-28 MED ORDER — SODIUM CHLORIDE 0.9 % IV SOLN: Freq: Once | INTRAVENOUS | Status: AC

## 2021-09-28 NOTE — Progress Notes (Signed)
Pre Iron fetal heart tones: ?Baby A: 140 ?Baby B: 154 ? ?Post 30 min observation: ?Baby A: 148 ?Baby B: 157 ? ?Pt tolerated treatment well. Discharged VSS, ambulatory to lobby. ?

## 2021-09-28 NOTE — Patient Instructions (Signed)

## 2021-10-01 ENCOUNTER — Inpatient Hospital Stay: Payer: 59

## 2021-10-01 ENCOUNTER — Other Ambulatory Visit: Payer: Self-pay

## 2021-10-01 ENCOUNTER — Other Ambulatory Visit: Payer: Self-pay | Admitting: *Deleted

## 2021-10-01 VITALS — BP 105/62 | HR 88 | Temp 98.4°F | Resp 16

## 2021-10-01 DIAGNOSIS — O365931 Maternal care for other known or suspected poor fetal growth, third trimester, fetus 1: Secondary | ICD-10-CM

## 2021-10-01 DIAGNOSIS — D508 Other iron deficiency anemias: Secondary | ICD-10-CM

## 2021-10-01 DIAGNOSIS — O30043 Twin pregnancy, dichorionic/diamniotic, third trimester: Secondary | ICD-10-CM

## 2021-10-01 DIAGNOSIS — O99013 Anemia complicating pregnancy, third trimester: Secondary | ICD-10-CM | POA: Diagnosis not present

## 2021-10-01 MED ORDER — SODIUM CHLORIDE 0.9 % IV SOLN: Freq: Once | INTRAVENOUS | Status: AC

## 2021-10-01 MED ORDER — SODIUM CHLORIDE 0.9 % IV SOLN
200.0000 mg | Freq: Once | INTRAVENOUS | Status: AC
  Administered 2021-10-01: 200 mg via INTRAVENOUS
  Filled 2021-10-01: qty 200

## 2021-10-01 NOTE — Progress Notes (Signed)
Fetal tones checked by dopper prior to treatment:  baby A:  144   ?Baby B:  155 ?

## 2021-10-01 NOTE — Patient Instructions (Signed)

## 2021-10-02 ENCOUNTER — Ambulatory Visit: Payer: 59 | Attending: Obstetrics and Gynecology

## 2021-10-02 ENCOUNTER — Other Ambulatory Visit: Payer: Self-pay | Admitting: Obstetrics and Gynecology

## 2021-10-02 ENCOUNTER — Ambulatory Visit (HOSPITAL_BASED_OUTPATIENT_CLINIC_OR_DEPARTMENT_OTHER): Payer: 59 | Admitting: *Deleted

## 2021-10-02 ENCOUNTER — Ambulatory Visit: Payer: 59 | Admitting: *Deleted

## 2021-10-02 VITALS — BP 111/67 | HR 89

## 2021-10-02 DIAGNOSIS — O30043 Twin pregnancy, dichorionic/diamniotic, third trimester: Secondary | ICD-10-CM | POA: Diagnosis not present

## 2021-10-02 DIAGNOSIS — D573 Sickle-cell trait: Secondary | ICD-10-CM

## 2021-10-02 DIAGNOSIS — Z3A33 33 weeks gestation of pregnancy: Secondary | ICD-10-CM

## 2021-10-02 DIAGNOSIS — O285 Abnormal chromosomal and genetic finding on antenatal screening of mother: Secondary | ICD-10-CM

## 2021-10-02 DIAGNOSIS — O365931 Maternal care for other known or suspected poor fetal growth, third trimester, fetus 1: Secondary | ICD-10-CM

## 2021-10-02 DIAGNOSIS — O09813 Supervision of pregnancy resulting from assisted reproductive technology, third trimester: Secondary | ICD-10-CM

## 2021-10-02 NOTE — Procedures (Signed)
Nancy Hunter 12-08-1990 [redacted]w[redacted]d   Fetus B Non-Stress Test Interpretation for 10/02/21  Indication:  di di twins  Fetal Heart Rate Fetus B Mode: External, Doppler Baseline Rate (B): 150 BPM Variability: Moderate Accelerations: 15 x 15 Decelerations: None  Uterine Activity Mode: Palpation, Toco Contraction Frequency (min): none Resting Tone Palpated: Relaxed  Interpretation (Baby B - Fetal Testing) Nonstress Test Interpretation (Baby B): Reactive Overall Impression (Baby B): Reassuring for gestational age Comments (Baby B): Dr. Grace Bushy reviewed tracing  Nancy Hunter [birth year] [redacted]w[redacted]d  Fetus A Non-Stress Test Interpretation for 10/02/21  Indication:  ^ UAD  Fetal Heart Rate A Mode: External Baseline Rate (A): 140 bpm Variability: Moderate Accelerations: 15 x 15 Decelerations: None Multiple birth?: Yes  Uterine Activity Mode: Palpation, Toco Contraction Frequency (min): none Resting Tone Palpated: Relaxed  Interpretation (Fetal Testing) Nonstress Test Interpretation: Reactive Overall Impression: Reassuring for gestational age Comments: Dr.Booker reviewed tracing

## 2021-10-03 LAB — OB RESULTS CONSOLE GBS: GBS: NEGATIVE

## 2021-10-11 ENCOUNTER — Encounter: Payer: Self-pay | Admitting: *Deleted

## 2021-10-11 ENCOUNTER — Ambulatory Visit: Payer: 59 | Admitting: *Deleted

## 2021-10-11 ENCOUNTER — Other Ambulatory Visit: Payer: Self-pay | Admitting: *Deleted

## 2021-10-11 ENCOUNTER — Ambulatory Visit: Payer: 59 | Attending: Obstetrics and Gynecology

## 2021-10-11 VITALS — BP 121/60 | HR 87

## 2021-10-11 DIAGNOSIS — O30043 Twin pregnancy, dichorionic/diamniotic, third trimester: Secondary | ICD-10-CM

## 2021-10-11 DIAGNOSIS — D573 Sickle-cell trait: Secondary | ICD-10-CM

## 2021-10-11 DIAGNOSIS — O365931 Maternal care for other known or suspected poor fetal growth, third trimester, fetus 1: Secondary | ICD-10-CM | POA: Diagnosis not present

## 2021-10-11 DIAGNOSIS — O285 Abnormal chromosomal and genetic finding on antenatal screening of mother: Secondary | ICD-10-CM

## 2021-10-11 DIAGNOSIS — D508 Other iron deficiency anemias: Secondary | ICD-10-CM

## 2021-10-11 DIAGNOSIS — O09813 Supervision of pregnancy resulting from assisted reproductive technology, third trimester: Secondary | ICD-10-CM

## 2021-10-11 DIAGNOSIS — Z3A34 34 weeks gestation of pregnancy: Secondary | ICD-10-CM

## 2021-10-11 DIAGNOSIS — E282 Polycystic ovarian syndrome: Secondary | ICD-10-CM

## 2021-10-11 DIAGNOSIS — O99283 Endocrine, nutritional and metabolic diseases complicating pregnancy, third trimester: Secondary | ICD-10-CM

## 2021-10-15 ENCOUNTER — Inpatient Hospital Stay: Payer: 59

## 2021-10-15 ENCOUNTER — Inpatient Hospital Stay (HOSPITAL_BASED_OUTPATIENT_CLINIC_OR_DEPARTMENT_OTHER): Payer: 59 | Admitting: Hematology and Oncology

## 2021-10-15 ENCOUNTER — Other Ambulatory Visit: Payer: Self-pay

## 2021-10-15 ENCOUNTER — Encounter (HOSPITAL_COMMUNITY): Payer: Self-pay

## 2021-10-15 ENCOUNTER — Encounter: Payer: Self-pay | Admitting: Hematology and Oncology

## 2021-10-15 ENCOUNTER — Telehealth (HOSPITAL_COMMUNITY): Payer: Self-pay | Admitting: *Deleted

## 2021-10-15 DIAGNOSIS — D508 Other iron deficiency anemias: Secondary | ICD-10-CM | POA: Diagnosis not present

## 2021-10-15 DIAGNOSIS — O99013 Anemia complicating pregnancy, third trimester: Secondary | ICD-10-CM | POA: Diagnosis not present

## 2021-10-15 LAB — CBC WITH DIFFERENTIAL (CANCER CENTER ONLY)
Abs Immature Granulocytes: 0.05 10*3/uL (ref 0.00–0.07)
Basophils Absolute: 0 10*3/uL (ref 0.0–0.1)
Basophils Relative: 0 %
Eosinophils Absolute: 0.1 10*3/uL (ref 0.0–0.5)
Eosinophils Relative: 1 %
HCT: 31.1 % — ABNORMAL LOW (ref 36.0–46.0)
Hemoglobin: 10.8 g/dL — ABNORMAL LOW (ref 12.0–15.0)
Immature Granulocytes: 1 %
Lymphocytes Relative: 23 %
Lymphs Abs: 1.6 10*3/uL (ref 0.7–4.0)
MCH: 29.1 pg (ref 26.0–34.0)
MCHC: 34.7 g/dL (ref 30.0–36.0)
MCV: 83.8 fL (ref 80.0–100.0)
Monocytes Absolute: 0.6 10*3/uL (ref 0.1–1.0)
Monocytes Relative: 8 %
Neutro Abs: 4.8 10*3/uL (ref 1.7–7.7)
Neutrophils Relative %: 67 %
Platelet Count: 145 10*3/uL — ABNORMAL LOW (ref 150–400)
RBC: 3.71 MIL/uL — ABNORMAL LOW (ref 3.87–5.11)
RDW: 13.7 % (ref 11.5–15.5)
WBC Count: 7.1 10*3/uL (ref 4.0–10.5)
nRBC: 0 % (ref 0.0–0.2)

## 2021-10-15 NOTE — Assessment & Plan Note (Signed)
This is a very pleasant 31 year old female patient currently 30 weeks of gestation referred to hematology for consideration of IV iron for iron deficiency anemia.  She received IV venofer and tolerated it well CBC with Hemoglobin at 10.8, SOB and energy are better Since hemoglobin greater than 10, ok to proceed with C section as planned from hematological stand point. RTC in 3/4 months for follow up on IDA.

## 2021-10-15 NOTE — Patient Instructions (Signed)
[name]  10/15/2021   Your procedure is scheduled on:  10/25/2021  Arrive at 2:45 PM at Entrance C on [address] at [facility]  and [employer]. You are invited to use the FREE valet parking or use the Visitor's parking deck.  Pick up the phone at the desk and dial [phone].  Call this number if you have problems the morning of surgery: [phone]  Remember:   Do not eat food:.after 8:45 AM  Do not drink clear liquids: (6 Hours before arrival) 6 horas ante llegada.  Take these medicines the morning of surgery with A SIP OF WATER:  none   Do not wear jewelry, make-up or nail polish.  Do not wear lotions, powders, or perfumes. Do not wear deodorant.  Do not shave 48 hours prior to surgery.  Do not bring valuables to the hospital.  [facility] is not   responsible for any belongings or valuables brought to the hospital.  Contacts, dentures or bridgework may not be worn into surgery.  Leave suitcase in the car. After surgery it may be brought to your room.  For patients admitted to the hospital, checkout time is 11:00 AM the day of              discharge.      Please read over the following fact sheets that you were given:     Preparing for Surgery

## 2021-10-15 NOTE — Telephone Encounter (Signed)
Preadmission screen  

## 2021-10-15 NOTE — Progress Notes (Signed)
[facility] CONSULT NOTE  Patient Care Team: [name], DO as PCP - General (Obstetrics and Gynecology)  CHIEF COMPLAINTS/PURPOSE OF CONSULTATION:  IDA  ASSESSMENT & PLAN:   IDA (iron deficiency anemia) This is a very pleasant 31 year old female patient currently 30 weeks of gestation referred to hematology for consideration of IV iron for iron deficiency anemia.  She received IV venofer and tolerated it well CBC with Hemoglobin at 10.8, SOB and energy are better Since hemoglobin greater than 10, ok to proceed with C section as planned from hematological stand point. RTC in 3/4 months for follow up on IDA.    HISTORY OF PRESENTING ILLNESS:  Nancy Hunter 31 y.o. female is here because of IDA  This is a very pleasant 31 year old female patient with no significant past medical history currently pregnant with twins at about 30 weeks of gestation referred to hematology for consideration of intravenous iron given ongoing iron deficiency anemia.   She received IV iron and has tolerated this well. She is here for follow-up. She is almost [redacted] weeks pregnant. She had more energy after the iron infusion. SOB is better. C section scheduled for 6/9 She is excited and stressed at the same time.  MEDICAL HISTORY:  Past Medical History:  Diagnosis Date   Anemia    Anxiety    Chronic back pain    Depression    PCOS (polycystic ovarian syndrome)     SURGICAL HISTORY: Past Surgical History:  Procedure Laterality Date   COLPOSCOPY     ECTOPIC PREGNANCY SURGERY  04/09/2016   WISDOM TOOTH EXTRACTION      SOCIAL HISTORY: Social History   Socioeconomic History   Marital status: Significant Other    Spouse name: Not on file   Number of children: Not on file   Years of education: Not on file   Highest education level: Not on file  Occupational History   Not on file  Tobacco Use   Smoking status: Former    Types: Cigarettes    Quit date: 10/24/2020    Years since  quitting: 0.9   Smokeless tobacco: Never  Vaping Use   Vaping Use: Former   Quit date: 10/24/2020  Substance and Sexual Activity   Alcohol use: Not Currently    Comment: not while preg   Drug use: No   Sexual activity: Yes    Birth control/protection: None  Other Topics Concern   Not on file  Social History Narrative   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  MEDICATIONS:  Current Outpatient Medications  Medication Sig Dispense Refill   aspirin EC 81 MG tablet Take 81 mg by mouth daily. Swallow whole.     docusate sodium (COLACE) 100 MG capsule Take 100 mg by mouth 2 (two) times daily.     ferrous sulfate 325 (65 FE) MG tablet Take 325 mg by mouth 2 (  two) times daily with a meal. (Patient not taking: Reported on 09/25/2021)     folic acid (FOLVITE) 800 MCG tablet Take 400 mcg by mouth daily.     Prenatal Vit-Fe Fumarate-FA (PRENATAL MULTIVITAMIN) TABS tablet Take 1 tablet by mouth daily at 12 noon.     No current facility-administered medications for this visit.     PHYSICAL EXAMINATION: ECOG PERFORMANCE STATUS: 0 - Asymptomatic  Vitals:   10/15/21 1558  BP: 123/66  Pulse: 79  Resp: 16  Temp: 98.1 F (36.7 C)  SpO2: 100%   Filed Weights   10/15/21 1558  Weight: 171 lb 8 oz (77.8 kg)    Physical Exam Constitutional:      Comments: Gravid uterus  Musculoskeletal:        General: No swelling.  Neurological:      Mental Status: She is alert.  Psychiatric:        Mood and Affect: Mood normal.    LABORATORY DATA:  I have reviewed the data as listed Lab Results  Component Value Date   WBC 7.1 10/15/2021   HGB 10.8 (L) 10/15/2021   HCT 31.1 (L) 10/15/2021   MCV 83.8 10/15/2021   PLT 145 (L) 10/15/2021     Chemistry      Component Value Date/Time   NA 138 01/04/2016 1628   K 4.2 01/04/2016 1628   CL 99 01/04/2016 1628   CO2 27 01/04/2016 1628   BUN 15 01/04/2016 1628   CREATININE 0.94 01/04/2016 1628      Component Value Date/Time   CALCIUM 10.0 01/04/2016 1628   ALKPHOS 54 01/05/2015 1106   AST 20 01/05/2015 1106   ALT 14 01/05/2015 1106   BILITOT 0.3 01/05/2015 1106      All questions were answered. The patient knows to call the clinic with any problems, questions or concerns. I spent 20 minutes in the care of this patient including H and P, review of records, counseling and coordination of care.     Rachel Moulds, MD 10/15/2021 4:27 PM

## 2021-10-16 ENCOUNTER — Ambulatory Visit: Payer: 59 | Admitting: *Deleted

## 2021-10-16 ENCOUNTER — Encounter (HOSPITAL_COMMUNITY): Payer: Self-pay

## 2021-10-16 ENCOUNTER — Encounter: Payer: Self-pay | Admitting: *Deleted

## 2021-10-16 ENCOUNTER — Ambulatory Visit: Payer: 59 | Attending: Obstetrics

## 2021-10-16 VITALS — BP 121/71 | HR 71

## 2021-10-16 DIAGNOSIS — O285 Abnormal chromosomal and genetic finding on antenatal screening of mother: Secondary | ICD-10-CM

## 2021-10-16 DIAGNOSIS — O99283 Endocrine, nutritional and metabolic diseases complicating pregnancy, third trimester: Secondary | ICD-10-CM

## 2021-10-16 DIAGNOSIS — Z3A35 35 weeks gestation of pregnancy: Secondary | ICD-10-CM

## 2021-10-16 DIAGNOSIS — O365931 Maternal care for other known or suspected poor fetal growth, third trimester, fetus 1: Secondary | ICD-10-CM | POA: Insufficient documentation

## 2021-10-16 DIAGNOSIS — O30043 Twin pregnancy, dichorionic/diamniotic, third trimester: Secondary | ICD-10-CM | POA: Insufficient documentation

## 2021-10-16 DIAGNOSIS — E282 Polycystic ovarian syndrome: Secondary | ICD-10-CM

## 2021-10-16 DIAGNOSIS — D573 Sickle-cell trait: Secondary | ICD-10-CM | POA: Diagnosis not present

## 2021-10-16 DIAGNOSIS — O09813 Supervision of pregnancy resulting from assisted reproductive technology, third trimester: Secondary | ICD-10-CM

## 2021-10-16 LAB — FERRITIN: Ferritin: 261 ng/mL (ref 11–307)

## 2021-10-22 NOTE — H&P (Signed)
HPI: 31 y.o. [id number] @ [redacted]w[redacted]d estimated gestational age (as dated by 6 week ultrasound) presents for primary cesarean section for Di/Di twins with fetal growth restriction in Twin A. MFM recommends delivery between 36-37 weeks. Patient desires primary cesarean section for mode of delivery.  Leakage of fluid:  No Vaginal bleeding:  No Contractions:  No Fetal movement:  Yes x 2  Prenatal care has been provided by Dr. Katrinka Blazing. Nancy Hunter [facility])  ROS:  Denies fevers, chills, chest pain, visual changes, SOB, RUQ/epigastric pain, N/V, dysuria, hematuria, or sudden onset/worsening bilateral LE or facial edema.  Pregnancy complicated by: Dichorionic/Diamniotic Twin Gestation Fetal Growth Restriction in Twin A Sickle cell trait (FOB testing negative) Iron deficiency anemia (s/p iron transfusions this pregnancy) PCOS  Prenatal Transfer Tool  Maternal Diabetes: No Genetic Screening: Normal Maternal Ultrasounds/Referrals: IUGR Fetal Ultrasounds or other Referrals:  Referred to Materal Fetal Medicine  Maternal Substance Abuse:  No Significant Maternal Medications:  None Significant Maternal Lab Results: Group B Strep negative   Prenatal Labs Blood type:  O Positive Antibody screen:  Negative CBC:  H/H 10.8/31.1 Rubella: Immune RPR:  Non-reactive Hep B:  Negative Hep C:  Negative HIV:  Negative GC/CT:  Negative Glucola:  100.5 (wnl)  Immunizations: Tdap: Declines Flu: Declines  OBHx:  OB History     Gravida  4   Para  1   Term  1   Preterm  0   AB  2   Living  1      SAB  1   IAB  0   Ectopic  1   Multiple  0   Live Births  1          PMHx:  See above Meds:  PNV, ASA  daily Allergy:   Allergies  Allergen Reactions   Latex Hives   SurgHx: None SocHx:   Denies Tobacco, ETOH, illicit drugs  O: LMP 02/17/2021  Gen. AAOx3, NAD CV.  RRR  Resp. CTAB, no wheezes/rales/rhonchi Abd. Gravid, soft, non-tender throughout, no  rebound/guarding Extr.  No bilateral LE edema, no calf tenderness bilaterally   Narrative & Impression  ----------------------------------------------------------------------  OBSTETRICS REPORT                       (Signed Final 10/11/2021 03:04 pm) ---------------------------------------------------------------------- Patient Info  ID #:       [id number]                          D.O.B.:  1990-07-15 (30 yrs)  Name:       Nancy Hunter                    Visit Date: 10/11/2021 01:36 pm ---------------------------------------------------------------------- Performed By  Attending:        Ma Rings MD         Ref. Address:     [facility]                                                             [address]  [address]                                                             [place], [place]                                                             [id number]  Performed By:     Burt KnackJennifer Ives RDMS     Location[facility]         [facility]  Referred By:      Steva ReadyMELISSA Jersee Winiarski ---------------------------------------------------------------------- Orders  #  Description                           Code        Ordered By  1  US MFM OB FOLLOW UP                   [id number]    RAVI SHANKAR  2  US MFM UA CORD DOPPLER                76820.02    RAVI SHANKAR  3  US MFM FETAL BPP WO NON               76819.01    RAVI SHANKAR     STRESS  4  US MFM OB FOLLOW UP ADDL              [id number]    RAVI SHANKAR     GEST  5  US MFM UA ADDL GEST                   76820.01    RAVI SHANKAR  6  US MFM FETAL BPP WO NST               82956.276819.1     RAVI SHANKAR     ADDL  GESTATION ----------------------------------------------------------------------  #  Order #  Accession #                Episode #  1  [id number]                   [id number]                 [id number]  2  [id number]                   [id number]                 [id number]  3  [id number]                   [id number]                 [id number]  4  [id number]                   [id number]                 [id number]  5  [id number]                   [id number]                 [id number]  6  [id number]                   [id number]                 [id number] ---------------------------------------------------------------------- Indications  Maternal care for known or suspected poor      O36.5931  fetal growth, third trimester, fetus 1 IUGR  (Baby A)  Twin pregnancy, di/di, third trimester         O30.043  [redacted] weeks gestation of pregnancy                Z3A.34  Pregnancy resulting from assisted              O36.819  reproductive technology (TIC/Ovidrel)  Medical complication of pregnancy (PCOS)       O26.90  History of sickle cell trait                   Z86.2 ---------------------------------------------------------------------- Fetal Evaluation (Fetus A)  Num Of Fetuses:         2  Fetal Heart Rate(bpm):  146  Cardiac Activity:       Observed  Fetal Lie:              Maternal right side  Presentation:           Cephalic  Placenta:               Posterior  P. Cord Insertion:      Previously Visualized  Membrane Desc:      Dividing Membrane seen - Dichorionic.  Amniotic Fluid  AFI FV:      Within normal limits                              Largest Pocket(cm)                              3.79 ---------------------------------------------------------------------- Biophysical Evaluation (Fetus A)  Amniotic F.V:   Within normal limits       F. Tone:        Observed  F. Movement:  Observed                   Score:          8/8  F. Breathing:    Observed ---------------------------------------------------------------------- Biometry (Fetus A)  BPD:      82.5  mm     G. Age:  33w 1d         12  %    CI:        73.27   %    70 - 86                                                          FL/HC:      19.5   %    20.1 - 22.3  HC:      306.3  mm     G. Age:  34w 1d          8  %    HC/AC:      1.05        0.93 - 1.11  AC:      292.4  mm     G. Age:  33w 2d         16  %    FL/BPD:     72.5   %    71 - 87  FL:       59.8  mm     G. Age:  31w 1d        < 1  %    FL/AC:      20.5   %    20 - 24  Est. FW:    2033  gm      4 lb 8 oz      6  %     FW Discordancy         6  % ---------------------------------------------------------------------- OB History  Gravidity:    4         Term:   1        Prem:   0        SAB:   2  TOP:          0       Ectopic:  0        Living: 1 ---------------------------------------------------------------------- Gestational Age (Fetus A)  LMP:           33w 5d        Date:  02/17/21                 EDD:   11/24/21  U/S Today:     33w 0d                                        EDD:   11/29/21  Best:          34w 5d     Det. ByMarcella Dubs         EDD:   11/17/21                                      (  03/29/21) ---------------------------------------------------------------------- Anatomy (Fetus A)  Cranium:               Appears normal         Stomach:                Appears normal, left                                                                        sided  Heart:                 Previously seen        Kidneys:                Appear normal ---------------------------------------------------------------------- Doppler - Fetal Vessels (Fetus A)  Umbilical Artery   S/D     %tile      RI    %tile      PI    %tile     PSV    ADFV    RDFV                                                     (cm/s)   3.11       83    0.68       86    1.13       92     64.5      No       No ---------------------------------------------------------------------- Fetal Evaluation (Fetus B)  Num Of Fetuses:         2  Fetal Heart Rate(bpm):  158  Cardiac Activity:       Observed  Fetal Lie:              Maternal left side  Presentation:           Cephalic  Placenta:               Posterior  Amniotic Fluid  AFI FV:      Within normal limits                              Largest Pocket(cm)                              3 ---------------------------------------------------------------------- Biophysical Evaluation (Fetus B)  Amniotic F.V:   Within normal limits       F. Tone:        Observed  F. Movement:    Observed                   Score:          8/8  F. Breathing:   Observed ---------------------------------------------------------------------- Biometry (Fetus B)  BPD:      80.4  mm     G. Age:  32w 2d        3.1  %    CI:        68.46   %  70 - 86                                                          FL/HC:      20.8   %    20.1 - 22.3  HC:      310.6  mm     G. Age:  34w 5d         17  %    HC/AC:      1.07        0.93 - 1.11  AC:      291.6  mm     G. Age:  33w 1d         15  %    FL/BPD:     80.2   %    71 - 87  FL:       64.5  mm     G. Age:  33w 2d         11  %    FL/AC:      22.1   %    20 - 24  Est. FW:    2158  gm    4 lb 12 oz      12  %     FW Discordancy      0 \ 6 % ---------------------------------------------------------------------- Gestational Age (Fetus B)  LMP:           33w 5d        Date:  02/17/21                 EDD:   11/24/21  U/S Today:     33w 3d                                        EDD:   11/26/21  Best:          34w 5d     Det. By:  Marcella Dubs         EDD:   11/17/21                                      (03/29/21) ---------------------------------------------------------------------- Anatomy (Fetus B)  Cranium:               Appears normal         Kidneys:                Appear normal  Heart:                 Appears normal          Bladder:                Appears normal                         (4CH, axis, and                         situs)  Stomach:               Appears  normal, left                         sided ---------------------------------------------------------------------- Doppler - Fetal Vessels (Fetus B)  Umbilical Artery   S/D     %tile      RI    %tile      PI    %tile     PSV    ADFV    RDFV                                                     (cm/s)   3.11       83    0.68       86    1.04       83    43.79      No      No ---------------------------------------------------------------------- Comments  This patient was seen due to fetal growth restriction in a  dichorionic, diamniotic twin gestation.  She denies any  problems since her last exam.  She reports feeling vigorous  fetal movements of both fetuses throughout the day.  Twin A: EFW 4 pounds 8 ounces (6th percentile).  Normal  amniotic fluid.  Twin B: EFW 4 pounds 12 ounces (12th percentile).  Normal  amniotic fluid  Both fetuses have grown over 1 pound each over the past 3  weeks  Doppler studies of the umbilical arteries performed due to  fetal growth restriction showed a normal S/D ratio for both  twin A and twin B.  There were no signs of absent or reversed  end-diastolic flow noted today in either fetus.  A BPP performed today was 8 out of 8 for twin A and twin B.  She will return in 1 week for another BPP and umbilical artery  Doppler study.  Due to IUGR of twin A in a dichorionic twin pregnancy,  delivery is recommended at between 36 to 37 weeks. ----------------------------------------------------------------------                   Ma Rings, MD Electronically Signed Final Report   10/11/2021 03:04 pm ----------------------------------------------------------------------    Labs: see orders    A/P:  31 y.o. [id number] @ [redacted]w[redacted]d who presents for primary cesarean section for Di/Di twins with fetal growth restriction in  Twin A.  - Admit to L&D - Admit labs (CBC, T&S, COVID screen per protocol) - CEFM/Toco - Diet:  NPO - IVF:  Per routine - VTE Prophylaxis:  SCDs - GBS Status:  Negative - Presentation:  Cephalic x 2 on last [place] on 5/31 - Antibiotics:  Ancef 2g on call to OR - Shohl's on call to OR - Consents signed and witnessed  Consents: I have explained to the patient that this surgery is performed to deliver their baby or babies through an incision in the abdomen and incision in the uterus.  Prior to surgery, the risks and benefits of the surgery, as well as alternative treatments were discussed.  The risks include, but are not limited to, possible need for cesarean delivery for all future pregnancies, bleeding at the time of surgery that could necessitate a blood transfusion and/or hysterectomy, rupture of the uterus during a future pregnancy that could cause a preterm delivery and/or requiring hysterectomy, infection, damage to surrounding organs and tissues,  damage to bladder, damage to ureters, causing kidney damage, and requiring additional procedures, damage to bowels, resulting in further surgery, postoperative pain, short-term and long-term, scarring on the abdominal wall and intra-abdominally, need for further surgery, development of an incisional hernia, deep vein thrombosis and/or pulmonary embolism, wound infection and/or separation, painful intercourse, urinary leakage, impact on future pregnancies including but not limited to, abnormal location or attachment of the placenta to the uterus, such as placenta previa or accreta, that may necessitate a blood transfusion and/or hysterectomy, impact on total family size, complications the course of which cannot be predicted or prevented, and death. Patient was consented for blood products.  The patient is aware that bleeding may result in the need for a blood transfusion which includes risk of transmission of HIV (1:2 million), Hepatitis C (1:2 million), and  Hepatitis B (1:200 thousand) and transfusion reaction.  Patient voiced understanding of the above risks as well as understanding of indications for blood transfusion.   [name], DO [phone] (office)

## 2021-10-23 ENCOUNTER — Ambulatory Visit (HOSPITAL_BASED_OUTPATIENT_CLINIC_OR_DEPARTMENT_OTHER): Payer: 59

## 2021-10-23 ENCOUNTER — Encounter (HOSPITAL_COMMUNITY)
Admission: RE | Admit: 2021-10-23 | Discharge: 2021-10-23 | Disposition: A | Discharge status: Home / Self Care | Payer: 59 | Source: Ambulatory Visit | Attending: Family Medicine | Admitting: Family Medicine

## 2021-10-23 ENCOUNTER — Ambulatory Visit: Payer: 59 | Admitting: *Deleted

## 2021-10-23 VITALS — BP 115/68 | HR 91

## 2021-10-23 DIAGNOSIS — O30043 Twin pregnancy, dichorionic/diamniotic, third trimester: Secondary | ICD-10-CM | POA: Insufficient documentation

## 2021-10-23 DIAGNOSIS — E282 Polycystic ovarian syndrome: Secondary | ICD-10-CM | POA: Diagnosis not present

## 2021-10-23 DIAGNOSIS — Z01818 Encounter for other preprocedural examination: Secondary | ICD-10-CM | POA: Insufficient documentation

## 2021-10-23 DIAGNOSIS — O365931 Maternal care for other known or suspected poor fetal growth, third trimester, fetus 1: Secondary | ICD-10-CM | POA: Insufficient documentation

## 2021-10-23 DIAGNOSIS — Z3A36 36 weeks gestation of pregnancy: Secondary | ICD-10-CM

## 2021-10-23 DIAGNOSIS — O99283 Endocrine, nutritional and metabolic diseases complicating pregnancy, third trimester: Secondary | ICD-10-CM

## 2021-10-23 DIAGNOSIS — O09813 Supervision of pregnancy resulting from assisted reproductive technology, third trimester: Secondary | ICD-10-CM

## 2021-10-23 LAB — CBC
HCT: 33.5 % — ABNORMAL LOW (ref 36.0–46.0)
Hemoglobin: 11.4 g/dL — ABNORMAL LOW (ref 12.0–15.0)
MCH: 29.2 pg (ref 26.0–34.0)
MCHC: 34 g/dL (ref 30.0–36.0)
MCV: 85.7 fL (ref 80.0–100.0)
Platelets: 131 10*3/uL — ABNORMAL LOW (ref 150–400)
RBC: 3.91 MIL/uL (ref 3.87–5.11)
RDW: 14 % (ref 11.5–15.5)
WBC: 7.5 10*3/uL (ref 4.0–10.5)
nRBC: 0 % (ref 0.0–0.2)

## 2021-10-23 LAB — TYPE AND SCREEN
ABO/RH(D): O POS
Antibody Screen: NEGATIVE

## 2021-10-24 LAB — RPR: RPR Ser Ql: NONREACTIVE

## 2021-10-25 ENCOUNTER — Inpatient Hospital Stay (HOSPITAL_COMMUNITY)
Admission: RE | Admit: 2021-10-25 | Discharge: 2021-10-28 | DRG: 788 | Disposition: A | Discharge status: Home / Self Care | Payer: 59 | Attending: Obstetrics and Gynecology | Admitting: Obstetrics and Gynecology

## 2021-10-25 ENCOUNTER — Inpatient Hospital Stay (HOSPITAL_COMMUNITY): Payer: 59 | Admitting: Anesthesiology

## 2021-10-25 ENCOUNTER — Encounter (HOSPITAL_COMMUNITY): Payer: Self-pay | Admitting: Obstetrics and Gynecology

## 2021-10-25 ENCOUNTER — Encounter: Payer: Self-pay | Admitting: Hematology and Oncology

## 2021-10-25 ENCOUNTER — Encounter (HOSPITAL_COMMUNITY)
Admission: RE | Disposition: A | Discharge status: Home / Self Care | Payer: Self-pay | Source: Home / Self Care | Attending: Obstetrics and Gynecology

## 2021-10-25 ENCOUNTER — Other Ambulatory Visit: Payer: Self-pay

## 2021-10-25 DIAGNOSIS — E282 Polycystic ovarian syndrome: Secondary | ICD-10-CM | POA: Diagnosis present

## 2021-10-25 DIAGNOSIS — D509 Iron deficiency anemia, unspecified: Secondary | ICD-10-CM | POA: Diagnosis present

## 2021-10-25 DIAGNOSIS — D573 Sickle-cell trait: Secondary | ICD-10-CM | POA: Diagnosis present

## 2021-10-25 DIAGNOSIS — O9902 Anemia complicating childbirth: Secondary | ICD-10-CM | POA: Diagnosis present

## 2021-10-25 DIAGNOSIS — O30043 Twin pregnancy, dichorionic/diamniotic, third trimester: Secondary | ICD-10-CM | POA: Diagnosis not present

## 2021-10-25 DIAGNOSIS — Z3A36 36 weeks gestation of pregnancy: Secondary | ICD-10-CM

## 2021-10-25 DIAGNOSIS — O365931 Maternal care for other known or suspected poor fetal growth, third trimester, fetus 1: Secondary | ICD-10-CM | POA: Diagnosis not present

## 2021-10-25 DIAGNOSIS — O99284 Endocrine, nutritional and metabolic diseases complicating childbirth: Secondary | ICD-10-CM | POA: Diagnosis present

## 2021-10-25 DIAGNOSIS — O365930 Maternal care for other known or suspected poor fetal growth, third trimester, not applicable or unspecified: Secondary | ICD-10-CM | POA: Diagnosis present

## 2021-10-25 SURGERY — Surgical Case
Anesthesia: Spinal | Wound class: Clean Contaminated

## 2021-10-25 MED ORDER — OXYCODONE HCL 5 MG/5ML PO SOLN: 5.0000 mg | Freq: Once | ORAL | Status: DC | PRN

## 2021-10-25 MED ORDER — MEPERIDINE HCL 25 MG/ML IJ SOLN: 6.2500 mg | INTRAMUSCULAR | Status: DC | PRN

## 2021-10-25 MED ORDER — IBUPROFEN 600 MG PO TABS
600.0000 mg | ORAL_TABLET | Freq: Four times a day (QID) | ORAL | Status: DC
  Administered 2021-10-26 – 2021-10-28 (×7): 600 mg via ORAL
  Filled 2021-10-25 (×7): qty 1

## 2021-10-25 MED ORDER — ACETAMINOPHEN 10 MG/ML IV SOLN
INTRAVENOUS | Status: AC
  Filled 2021-10-25: qty 100

## 2021-10-25 MED ORDER — MORPHINE SULFATE (PF) 0.5 MG/ML IJ SOLN
INTRAMUSCULAR | Status: DC | PRN
  Administered 2021-10-25: 150 ug via INTRATHECAL

## 2021-10-25 MED ORDER — PHENYLEPHRINE HCL-NACL 20-0.9 MG/250ML-% IV SOLN
INTRAVENOUS | Status: DC | PRN
  Administered 2021-10-25: 60 ug/min via INTRAVENOUS

## 2021-10-25 MED ORDER — DEXAMETHASONE SODIUM PHOSPHATE 10 MG/ML IJ SOLN
INTRAMUSCULAR | Status: AC
  Filled 2021-10-25: qty 1

## 2021-10-25 MED ORDER — NALOXONE HCL 4 MG/10ML IJ SOLN: 1.0000 ug/kg/h | INTRAVENOUS | Status: DC | PRN

## 2021-10-25 MED ORDER — ACETAMINOPHEN 500 MG PO TABS
1000.0000 mg | ORAL_TABLET | Freq: Four times a day (QID) | ORAL | Status: DC
Start: 2021-10-26 — End: 2021-10-28
  Administered 2021-10-25 – 2021-10-28 (×11): 1000 mg via ORAL
  Filled 2021-10-25 (×11): qty 2

## 2021-10-25 MED ORDER — LACTATED RINGERS IV SOLN: INTRAVENOUS | Status: DC

## 2021-10-25 MED ORDER — SOD CITRATE-CITRIC ACID 500-334 MG/5ML PO SOLN
30.0000 mL | ORAL | Status: AC
  Administered 2021-10-25: 30 mL via ORAL

## 2021-10-25 MED ORDER — KETOROLAC TROMETHAMINE 30 MG/ML IJ SOLN
30.0000 mg | Freq: Four times a day (QID) | INTRAMUSCULAR | Status: AC
  Administered 2021-10-26 (×2): 30 mg via INTRAVENOUS
  Filled 2021-10-25 (×3): qty 1

## 2021-10-25 MED ORDER — DIBUCAINE (PERIANAL) 1 % EX OINT: 1.0000 "application " | TOPICAL_OINTMENT | CUTANEOUS | Status: DC | PRN

## 2021-10-25 MED ORDER — OXYTOCIN-SODIUM CHLORIDE 30-0.9 UT/500ML-% IV SOLN
INTRAVENOUS | Status: AC
  Filled 2021-10-25: qty 500

## 2021-10-25 MED ORDER — SOD CITRATE-CITRIC ACID 500-334 MG/5ML PO SOLN
ORAL | Status: AC
  Filled 2021-10-25: qty 30

## 2021-10-25 MED ORDER — DEXAMETHASONE SODIUM PHOSPHATE 10 MG/ML IJ SOLN
INTRAMUSCULAR | Status: DC | PRN
  Administered 2021-10-25: 10 mg via INTRAVENOUS

## 2021-10-25 MED ORDER — SODIUM CHLORIDE 0.9% FLUSH
3.0000 mL | INTRAVENOUS | Status: DC | PRN
Start: 2021-10-25 — End: 2021-10-28

## 2021-10-25 MED ORDER — OXYCODONE HCL 5 MG PO TABS
5.0000 mg | ORAL_TABLET | ORAL | Status: DC | PRN
  Administered 2021-10-26: 5 mg via ORAL
  Administered 2021-10-27 (×3): 10 mg via ORAL
  Administered 2021-10-27 – 2021-10-28 (×3): 5 mg via ORAL
  Administered 2021-10-28: 10 mg via ORAL
  Filled 2021-10-25: qty 1
  Filled 2021-10-25 (×2): qty 2
  Filled 2021-10-25: qty 1
  Filled 2021-10-25 (×2): qty 2
  Filled 2021-10-25 (×2): qty 1

## 2021-10-25 MED ORDER — DIPHENHYDRAMINE HCL 25 MG PO CAPS
25.0000 mg | ORAL_CAPSULE | Freq: Four times a day (QID) | ORAL | Status: DC | PRN
  Administered 2021-10-26: 25 mg via ORAL

## 2021-10-25 MED ORDER — KETOROLAC TROMETHAMINE 30 MG/ML IJ SOLN
30.0000 mg | Freq: Once | INTRAMUSCULAR | Status: AC | PRN
  Administered 2021-10-25: 30 mg via INTRAVENOUS

## 2021-10-25 MED ORDER — FERROUS SULFATE 325 (65 FE) MG PO TABS
325.0000 mg | ORAL_TABLET | Freq: Two times a day (BID) | ORAL | Status: DC
  Administered 2021-10-26 – 2021-10-27 (×4): 325 mg via ORAL
  Filled 2021-10-25 (×4): qty 1

## 2021-10-25 MED ORDER — ACETAMINOPHEN 10 MG/ML IV SOLN
INTRAVENOUS | Status: DC | PRN
  Administered 2021-10-25: 1000 mg via INTRAVENOUS

## 2021-10-25 MED ORDER — HYDROMORPHONE HCL 1 MG/ML IJ SOLN: 0.2500 mg | INTRAMUSCULAR | Status: DC | PRN

## 2021-10-25 MED ORDER — TRANEXAMIC ACID-NACL 1000-0.7 MG/100ML-% IV SOLN
INTRAVENOUS | Status: DC | PRN
  Administered 2021-10-25: 1000 mg via INTRAVENOUS

## 2021-10-25 MED ORDER — FENTANYL CITRATE (PF) 100 MCG/2ML IJ SOLN
INTRAMUSCULAR | Status: AC
  Filled 2021-10-25: qty 2

## 2021-10-25 MED ORDER — CEFAZOLIN SODIUM-DEXTROSE 2-4 GM/100ML-% IV SOLN
INTRAVENOUS | Status: AC
  Filled 2021-10-25: qty 100

## 2021-10-25 MED ORDER — DIPHENHYDRAMINE HCL 25 MG PO CAPS
25.0000 mg | ORAL_CAPSULE | ORAL | Status: DC | PRN
  Filled 2021-10-25: qty 1

## 2021-10-25 MED ORDER — SIMETHICONE 80 MG PO CHEW: 80.0000 mg | CHEWABLE_TABLET | ORAL | Status: DC | PRN

## 2021-10-25 MED ORDER — PROMETHAZINE HCL 25 MG/ML IJ SOLN: 6.2500 mg | INTRAMUSCULAR | Status: DC | PRN

## 2021-10-25 MED ORDER — COCONUT OIL OIL: 1.0000 "application " | TOPICAL_OIL | Status: DC | PRN

## 2021-10-25 MED ORDER — OXYTOCIN-SODIUM CHLORIDE 30-0.9 UT/500ML-% IV SOLN: 2.5000 [IU]/h | INTRAVENOUS | Status: AC

## 2021-10-25 MED ORDER — BUPIVACAINE IN DEXTROSE 0.75-8.25 % IT SOLN
INTRATHECAL | Status: DC | PRN
  Administered 2021-10-25: 1.6 mg via INTRATHECAL

## 2021-10-25 MED ORDER — KETOROLAC TROMETHAMINE 30 MG/ML IJ SOLN
INTRAMUSCULAR | Status: AC
  Filled 2021-10-25: qty 1

## 2021-10-25 MED ORDER — WITCH HAZEL-GLYCERIN EX PADS: 1.0000 "application " | MEDICATED_PAD | CUTANEOUS | Status: DC | PRN

## 2021-10-25 MED ORDER — OXYCODONE HCL 5 MG PO TABS: 5.0000 mg | ORAL_TABLET | Freq: Once | ORAL | Status: DC | PRN

## 2021-10-25 MED ORDER — ONDANSETRON HCL 4 MG/2ML IJ SOLN
INTRAMUSCULAR | Status: DC | PRN
  Administered 2021-10-25: 4 mg via INTRAVENOUS

## 2021-10-25 MED ORDER — SODIUM CHLORIDE 0.9 % IR SOLN
Status: DC | PRN
  Administered 2021-10-25: 1000 mL

## 2021-10-25 MED ORDER — TRANEXAMIC ACID-NACL 1000-0.7 MG/100ML-% IV SOLN
INTRAVENOUS | Status: AC
  Filled 2021-10-25: qty 100

## 2021-10-25 MED ORDER — MORPHINE SULFATE (PF) 2 MG/ML IV SOLN: 1.0000 mg | INTRAVENOUS | Status: DC | PRN

## 2021-10-25 MED ORDER — ZOLPIDEM TARTRATE 5 MG PO TABS: 5.0000 mg | ORAL_TABLET | Freq: Every evening | ORAL | Status: DC | PRN

## 2021-10-25 MED ORDER — MENTHOL 3 MG MT LOZG: 1.0000 | LOZENGE | OROMUCOSAL | Status: DC | PRN

## 2021-10-25 MED ORDER — TETANUS-DIPHTH-ACELL PERTUSSIS 5-2.5-18.5 LF-MCG/0.5 IM SUSY: 0.5000 mL | PREFILLED_SYRINGE | Freq: Once | INTRAMUSCULAR | Status: DC

## 2021-10-25 MED ORDER — ONDANSETRON HCL 4 MG/2ML IJ SOLN
INTRAMUSCULAR | Status: AC
  Filled 2021-10-25: qty 2

## 2021-10-25 MED ORDER — DIPHENHYDRAMINE HCL 50 MG/ML IJ SOLN
12.5000 mg | INTRAMUSCULAR | Status: DC | PRN
  Administered 2021-10-25 – 2021-10-26 (×3): 12.5 mg via INTRAVENOUS
  Filled 2021-10-25 (×2): qty 1

## 2021-10-25 MED ORDER — SODIUM CHLORIDE 0.9 % IR SOLN
Status: DC | PRN
  Administered 2021-10-25: 500 mL

## 2021-10-25 MED ORDER — CEFAZOLIN SODIUM-DEXTROSE 2-4 GM/100ML-% IV SOLN
2.0000 g | INTRAVENOUS | Status: AC
  Administered 2021-10-25: 2 g via INTRAVENOUS

## 2021-10-25 MED ORDER — DIPHENHYDRAMINE HCL 50 MG/ML IJ SOLN
INTRAMUSCULAR | Status: AC
  Filled 2021-10-25: qty 1

## 2021-10-25 MED ORDER — SIMETHICONE 80 MG PO CHEW
80.0000 mg | CHEWABLE_TABLET | Freq: Three times a day (TID) | ORAL | Status: DC
  Administered 2021-10-26 – 2021-10-28 (×7): 80 mg via ORAL
  Filled 2021-10-25 (×7): qty 1

## 2021-10-25 MED ORDER — MORPHINE SULFATE (PF) 0.5 MG/ML IJ SOLN
INTRAMUSCULAR | Status: AC
  Filled 2021-10-25: qty 10

## 2021-10-25 MED ORDER — FENTANYL CITRATE (PF) 100 MCG/2ML IJ SOLN
INTRAMUSCULAR | Status: DC | PRN
  Administered 2021-10-25: 15 ug via INTRATHECAL

## 2021-10-25 MED ORDER — SENNOSIDES-DOCUSATE SODIUM 8.6-50 MG PO TABS
2.0000 | ORAL_TABLET | Freq: Every day | ORAL | Status: DC
  Administered 2021-10-26 – 2021-10-28 (×3): 2 via ORAL
  Filled 2021-10-25 (×3): qty 2

## 2021-10-25 MED ORDER — NALOXONE HCL 0.4 MG/ML IJ SOLN: 0.4000 mg | INTRAMUSCULAR | Status: DC | PRN

## 2021-10-25 MED ORDER — PRENATAL MULTIVITAMIN CH
1.0000 | ORAL_TABLET | Freq: Every day | ORAL | Status: DC
  Administered 2021-10-26 – 2021-10-28 (×3): 1 via ORAL
  Filled 2021-10-25 (×3): qty 1

## 2021-10-25 MED ORDER — OXYTOCIN-SODIUM CHLORIDE 30-0.9 UT/500ML-% IV SOLN
INTRAVENOUS | Status: DC | PRN
  Administered 2021-10-25: 30 [IU] via INTRAVENOUS

## 2021-10-25 SURGICAL SUPPLY — 37 items
BENZOIN TINCTURE PRP APPL 2/3 (GAUZE/BANDAGES/DRESSINGS) ×1 IMPLANT
CHLORAPREP W/TINT 26 (MISCELLANEOUS) ×2 IMPLANT
CLAMP UMBILICAL CORD (MISCELLANEOUS) ×2 IMPLANT
CLOSURE STERI STRIP 1/2 X4 (GAUZE/BANDAGES/DRESSINGS) ×1 IMPLANT
CLOTH BEACON ORANGE TIMEOUT ST (SAFETY) ×1 IMPLANT
DRAPE C SECTION CLR SCREEN (DRAPES) ×1 IMPLANT
DRSG OPSITE POSTOP 4X10 (GAUZE/BANDAGES/DRESSINGS) ×2 IMPLANT
ELECT REM PT RETURN 9FT ADLT (ELECTROSURGICAL) ×3
ELECTRODE REM PT RTRN 9FT ADLT (ELECTROSURGICAL) IMPLANT
EXTRACTOR VACUUM KIWI (MISCELLANEOUS) ×1 IMPLANT
GAUZE SPONGE 4X4 12PLY STRL (GAUZE/BANDAGES/DRESSINGS) ×2 IMPLANT
GLOVE BIO SURGEON STRL SZ 6.5 (GLOVE) ×1 IMPLANT
GLOVE BIOGEL PI IND STRL 7.0 (GLOVE) IMPLANT
GLOVE BIOGEL PI INDICATOR 7.0 (GLOVE) ×2
GLOVE SURG SS PI 6.5 STRL IVOR (GLOVE) ×1 IMPLANT
GOWN STRL REUS W/ TWL LRG LVL3 (GOWN DISPOSABLE) IMPLANT
GOWN STRL REUS W/TWL LRG LVL3 (GOWN DISPOSABLE) ×3
KIT ABG SYR 3ML LUER SLIP (SYRINGE) ×1 IMPLANT
NDL HYPO 25X5/8 SAFETYGLIDE (NEEDLE) IMPLANT
NEEDLE HYPO 25X5/8 SAFETYGLIDE (NEEDLE) ×3 IMPLANT
NS IRRIG 1000ML POUR BTL (IV SOLUTION) ×1 IMPLANT
PACK C SECTION WH (CUSTOM PROCEDURE TRAY) ×1 IMPLANT
PAD ABD 8X10 STRL (GAUZE/BANDAGES/DRESSINGS) ×1 IMPLANT
PAD OB MATERNITY 4.3X12.25 (PERSONAL CARE ITEMS) ×1 IMPLANT
RTRCTR C-SECT PINK 25CM LRG (MISCELLANEOUS) ×1 IMPLANT
STRIP CLOSURE SKIN 1/2X4 (GAUZE/BANDAGES/DRESSINGS) ×1 IMPLANT
SUT MNCRL 0 VIOLET CTX 36 (SUTURE) IMPLANT
SUT MON AB-0 CT1 36 (SUTURE) ×2 IMPLANT
SUT MONOCRYL 0 CTX 36 (SUTURE) ×2
SUT PDS AB 1 CTX 36 (SUTURE) ×1 IMPLANT
SUT VIC AB 2-0 CT1 27 (SUTURE) ×1
SUT VIC AB 2-0 CT1 TAPERPNT 27 (SUTURE) IMPLANT
SUT VIC AB 4-0 KS 27 (SUTURE) ×1 IMPLANT
TAPE MEDIFIX FOAM 3 (GAUZE/BANDAGES/DRESSINGS) ×1 IMPLANT
TOWEL OR 17X24 6PK STRL BLUE (TOWEL DISPOSABLE) ×1 IMPLANT
TRAY FOLEY W/BAG SLVR 14FR LF (SET/KITS/TRAYS/PACK) ×1 IMPLANT
WATER STERILE IRR 1000ML POUR (IV SOLUTION) ×1 IMPLANT

## 2021-10-25 NOTE — Interval H&P Note (Signed)
History and Physical Interval Note:  10/25/2021 4:24 PM  Nancy Hunter  has presented today for surgery, with the diagnosis of primary cesarean section for twins.  The various methods of treatment have been discussed with the patient and family. After consideration of risks, benefits and other options for treatment, the patient has consented to  Procedure(s) with comments: CESAREAN SECTION MULTI-GESTATIONAL (N/A) - Request OB Fellow APPLICATION OF CELL SAVER (Bilateral) as a surgical intervention.  The patient's history has been reviewed, patient examined, no change in status, stable for surgery.  I have reviewed the patient's chart and labs.  Questions were answered to the patient's satisfaction.     [name]

## 2021-10-25 NOTE — Anesthesia Procedure Notes (Signed)
Spinal  Patient location during procedure: OB Start time: 10/25/2021 4:32 PM End time: 10/25/2021 4:37 PM Reason for block: surgical anesthesia Staffing Performed: anesthesiologist  Anesthesiologist: [name], MD Performed by: [name], MD Authorized by: [name], MD   Preanesthetic Checklist Completed: patient identified, IV checked, risks and benefits discussed, surgical consent, monitors and equipment checked, pre-op evaluation and timeout performed Spinal Block Patient position: sitting Prep: DuraPrep and site prepped and draped Patient monitoring: heart rate, cardiac monitor, continuous pulse ox and blood pressure Approach: midline Location: L3-4 Injection technique: single-shot Needle Needle type: Pencan  Needle gauge: 24 G Needle length: 10 cm Assessment Sensory level: T4 Events: CSF return

## 2021-10-25 NOTE — Anesthesia Preprocedure Evaluation (Signed)
Anesthesia Evaluation  Patient identified by MRN, date of birth, ID band Patient awake    Reviewed: Allergy & Precautions, H&P , NPO status , Patient's Chart, lab work & pertinent test results  Airway Mallampati: II  TM Distance: >3 FB Neck ROM: Full    Dental no notable dental hx. (+) Teeth Intact, Dental Advisory Given   Pulmonary neg pulmonary ROS, former smoker,    Pulmonary exam normal breath sounds clear to auscultation       Cardiovascular negative cardio ROS Normal cardiovascular exam Rhythm:Regular Rate:Normal     Neuro/Psych  Headaches, Anxiety Depression negative psych ROS   GI/Hepatic negative GI ROS, Neg liver ROS,   Endo/Other    Renal/GU      Musculoskeletal Hx of chronic back pain   Abdominal   Peds  Hematology  (+) Blood dyscrasia, anemia ,   Anesthesia Other Findings   Reproductive/Obstetrics (+) Pregnancy                             Lab Results  Component Value Date   WBC 7.5 10/23/2021   HGB 11.4 (L) 10/23/2021   HCT 33.5 (L) 10/23/2021   MCV 85.7 10/23/2021   PLT 131 (L) 10/23/2021    Anesthesia Physical  Anesthesia Plan  ASA: II  Anesthesia Plan: Spinal   Post-op Pain Management:    Induction:   PONV Risk Score and Plan: 2 and Treatment may vary due to age or medical condition  Airway Management Planned:   Additional Equipment:   Intra-op Plan:   Post-operative Plan:   Informed Consent: I have reviewed the patients History and Physical, chart, labs and discussed the procedure including the risks, benefits and alternatives for the proposed anesthesia with the patient or authorized representative who has indicated his/her understanding and acceptance.       Plan Discussed with:   Anesthesia Plan Comments:         Anesthesia Quick Evaluation

## 2021-10-25 NOTE — Lactation Note (Addendum)
This note was copied from a baby's chart. Lactation Consultation Note  Patient Name: Nancy Hunter OHFGB'M Date: 10/25/2021 Reason for consult: Initial assessment;Late-preterm 34-36.6wks;Infant < 6lbs, multiple gestation.  Age:33 hours Mom's feeding choice is breast and formula feeding. Mom has DEBP at home and ordering [facility] DEBP through her insurance.  Mom is experienced at breastfeeding see maternal data below. Per mom, both infant Baby A and Baby B breastfeed for 20 minutes each and supplemented with formula, prior to [facility] entering the room. Per dad, Baby A and Baby B took very  little of  the formula maybe 1 or 2 mls,  they were given yellow slow flow nipple, LC switched nipple to [facility] that mom will try at next feeding. Mom will follow LPTI feeding policy ( green sheet) given. Mom knows to : BF infants according to cues, 8 to 12 times within 24 hours, skin to skin, limit total feedings 30 minutes or less. Mom plans to latch infans at breast and supplement with formula afterwards for each feeding. Mom knows to call RN/LC if she need latch assistance.  Mom made aware of O/P services, breastfeeding support groups, community resources, and our phone # for post-discharge questions.   Maternal Data Has patient been taught Hand Expression?: Yes Does the patient have breastfeeding experience prior to this delivery?: Yes How long did the patient breastfeed?: Per mom, she BF 1st child ( daughter) for one year, she is currently 3 years now.  Feeding Mother's Current Feeding Choice: Breast Milk and Formula  LATCH Score                    Lactation Tools Discussed/Used    Interventions Interventions: Breast feeding basics reviewed;Skin to skin;Education;LPT handout/interventions;LC Services brochure  Discharge    Consult Status Consult Status: Follow-up Date: 10/26/21 Follow-up type: In-patient    Danelle Earthly 10/25/2021, 10:20 PM

## 2021-10-25 NOTE — Op Note (Signed)
Pre Op Dx:   1. Live Dichorionic Diamniotic Twin Gestation 2. Fetal Growth Restriction 6% of Twin A  Post Op Dx:  Same as pre-operative diagnoses  Procedure:   Low Transverse Cesarean Section  Surgeon:  Dr. [name] Assistants:  Dr. Leticia Penna (OBGYN Fellow) Anesthesia:  Spinal  EBL:  500cccc  IVF:  2000cc UOP:  300cc  Drains:  Foley catheter  Specimen removed:  Placenta x 2 - sent to pathology Device(s) implanted:  None Case Type:  Clean-contaminated Findings: Normal-appearing uterus, bilateral fallopian tubes, and ovaries. Both twins in cephalic position, clear amniotic fluid. Twin A: APGAR 8/9  Twin B: APGAR 8/9 Complications: None  Indications:  31 y.o. [id number] at [redacted]w[redacted]d with Dichorionic/Diamniotic Twin gestation with Fetal Growth Restriction of Twin A. MFM recommended delivery between 36-37 weeks and patient desired cesarean section for mode of delivery.  Procedure:  After informed consent was obtained, the patient was brought to the operating room.  Following administration of spinal anesthesia, the patient was positioned in dorsal supine position with a leftward tilt and was prepped and draped in sterile fashion.  A preoperative time-out was performed.  The abdomen was entered in layers through a pfannenstiel incision and an Teacher, early years/pre was placed.  A low transverse hysterotomy was created sharply to the level of the membranes, then extended bluntly. Fetus A was delivered from cephalic presentation onto the field.  Bulb suctioning was performed.  The cord was doubly clamped and cut after a 60 second pause. The newborn was passed to the warmer. Fetus B was delivered from cephalic presentation onto the field.  Bulb suctioning was performed.  The cord was doubly clamped and cut after an approximately 60 second pause. The newborn was passed to the warmer. The placentas were delivered. TXA 1g was given as prophylaxis. The uterus was swept free of clots and debris and  closed in a running locked fashion with 0-Monocryl. A second imbricating layer was used to close the uterus using 0-Monocryl. Two additional figure-of-eight sutures were placed on the right lateral aspect of the hysterotomy for additional hemostasis using 0-Monocryl.  Hemostasis was verified.  The abdomen was irrigated with warmed saline and cleared of clots.  The peritoneum was closed in a running fashion with 2-0 Vicryl.  Subfascial spaces were inspected and hemostasis assured.  The fascia was closed in a running fashion with 0-PDS.  The subcutaneous tissues were irrigated and hemostasis assured.  The subcutaneous tissues were closed with 3-0 Monocryl.  The skin was closed with 4-0 Vicryl.  A sterile bandage was applied.  The patient was transferred to PACU.  All needle, sponge, and instrument counts were correct at the end of the case.    Disposition:  PACU  I performed the procedure and the assistant was needed due to the complexity of the anatomy.  [name], DO

## 2021-10-26 ENCOUNTER — Encounter (HOSPITAL_COMMUNITY): Payer: Self-pay | Admitting: Obstetrics and Gynecology

## 2021-10-26 LAB — CBC
HCT: 28 % — ABNORMAL LOW (ref 36.0–46.0)
Hemoglobin: 9.3 g/dL — ABNORMAL LOW (ref 12.0–15.0)
MCH: 28.4 pg (ref 26.0–34.0)
MCHC: 33.2 g/dL (ref 30.0–36.0)
MCV: 85.4 fL (ref 80.0–100.0)
Platelets: 128 10*3/uL — ABNORMAL LOW (ref 150–400)
RBC: 3.28 MIL/uL — ABNORMAL LOW (ref 3.87–5.11)
RDW: 13.4 % (ref 11.5–15.5)
WBC: 11.9 10*3/uL — ABNORMAL HIGH (ref 4.0–10.5)
nRBC: 0 % (ref 0.0–0.2)

## 2021-10-26 NOTE — Lactation Note (Addendum)
This note was copied from a baby's chart. Lactation Consultation Note  Patient Name: Nancy Hunter [id number] Date: 10/26/2021 Reason for consult: Follow-up assessment;Mother's request;Difficult latch;Late-preterm 34-36.6wks;Infant < 6lbs;Breastfeeding assistance;Maternal endocrine disorder Age:33 hours  LC alerted RN, Kearney Hard to observe a feeding with Baby A, infant taking small volumes not going to breast with last few attempts. SLP also came and adjusted feeding plan.   Maternal Data Has patient been taught Hand Expression?: Yes  Feeding Mother's Current Feeding Choice: Breast Milk and Formula Nipple Type: Nfant Extra Slow Flow (gold)  LATCH Score Latch: Repeated attempts needed to sustain latch, nipple held in mouth throughout feeding, stimulation needed to elicit sucking reflex.  Audible Swallowing: None  Type of Nipple: Everted at rest and after stimulation  Comfort (Breast/Nipple): Soft / non-tender  Hold (Positioning): Assistance needed to correctly position infant at breast and maintain latch.  LATCH Score: 6   Lactation Tools Discussed/Used Tools: Pump;Flanges;Hands-free pumping top Flange Size: 27 Breast pump type: Other (comment) (personal pump n style) Pump Education: Setup, frequency, and cleaning;Milk Storage Reason for Pumping: increase stimulation Pumping frequency: post pump after each feeding for 15 min  Interventions Interventions: Breast feeding basics reviewed;Assisted with latch;Skin to skin;Breast massage;Hand express;Breast compression;Position options;Expressed milk;DEBP;Education;Pace feeding;Infant Driven Feeding Algorithm education;LPT handout/interventions;Comfort gels  Discharge Pump: Personal;Hands Free WIC Program: No  Consult Status Consult Status: Follow-up Date: 10/27/21 Follow-up type: In-patient    Nancy Hunter  Nancy Hunter 10/26/2021, 8:38 PM

## 2021-10-26 NOTE — Lactation Note (Signed)
This note was copied from a baby's chart. Lactation Consultation Note  Patient Name: Nancy Hunter [id number] Date: 10/26/2021 Reason for consult: Follow-up assessment;Mother's request;Difficult latch;Late-preterm 34-36.6wks;Infant < 6lbs;Multiple gestation;Maternal endocrine disorder Age:33 hours  LC returned to work with Baby B with similar results. Infant B would not open wide enough to latch. Mom fed 3 ml of formula then try latch but not able to get him to latch.  Baby B took total of 16 ml of Sim 22 cal for this feeding with White Nfant nipple.   Mom following feeding plan of SLP. Mom latch as infant shows interest, followed by pace bottle feeding with any EBM first followed by formula. LPTI guidelines reviewed.  All questions answered at the end of the visit.   Mom to call to set up hands free bra after she eats her meal.   Maternal Data Has patient been taught Hand Expression?: Yes  Feeding Mother's Current Feeding Choice: Breast Milk and Formula  LATCH Score Latch: Repeated attempts needed to sustain latch, nipple held in mouth throughout feeding, stimulation needed to elicit sucking reflex.  Audible Swallowing: None  Type of Nipple: Everted at rest and after stimulation  Comfort (Breast/Nipple): Soft / non-tender  Hold (Positioning): Assistance needed to correctly position infant at breast and maintain latch.  LATCH Score: 6   Lactation Tools Discussed/Used    Interventions Interventions: Breast feeding basics reviewed;Assisted with latch;Skin to skin;Breast massage;Hand express;Breast compression;Adjust position;Expressed milk;Support pillows;DEBP;Education;LPT handout/interventions;Infant Driven Feeding Algorithm education  Discharge Pump: Personal;Hands Free  Consult Status Consult Status: Follow-up Date: 10/27/21 Follow-up type: In-patient    Nancy Hunter  Nancy Hunter 10/26/2021, 6:13 PM

## 2021-10-26 NOTE — Progress Notes (Signed)
Nancy Hunter was referred for history of depression/anxiety. * Referral screened out by Clinical Social Worker because none of the following criteria appear to apply: ~ History of anxiety/depression during this pregnancy, or of post-partum depression following prior delivery. ~ Diagnosis of anxiety and/or depression within last 3 years OR * Nancy Hunter's symptoms currently being treated with medication and/or therapy. Per prenatal records review, Nancy Hunter has a therapist Melton Krebs).   Please contact the Clinical Social Worker if needs arise, by [facility] request, or if Nancy Hunter scores greater than 9/yes to question 10 on Edinburgh Postpartum Depression Screen.  Celso Sickle, LCSW Clinical Social Worker [facility] Cell#: [phone]

## 2021-10-26 NOTE — Lactation Note (Signed)
This note was copied from a baby's chart. Lactation Consultation Note  Patient Name: Nancy Hunter [id number] Date: 10/26/2021 Reason for consult: Follow-up assessment;Mother's request;Difficult latch;Late-preterm 34-36.6wks;Infant < 6lbs;Breastfeeding assistance;Maternal endocrine disorder Age:33 hours Infant A Mom struggling to wake him to nurse. LC attempted gave 2 ml of colostrum on spoon then tried to latch. Infant not opening his mouth wide enough to latch. LC attempted to check latch with finger, infant mouth tight not able to get him to open, noting sensitive gag reflex with tip of my finger.   LC then gave a bottle with nFant white nipple. Small amounts 5 ml in bottle stroking rough of mouth in sideline to get him to suck. He took 5 ml of Sim 22 cal then brought up medium amount. LC burped him and tried again, he took 4 ml more. All together in feeding total of 10 ml given (colostrum, then formula)  Mom encouraged to use her pump n style. Mom to post pump after latching on initial setting for 15 min.   Mom aware if infant not latching she can offer more volume. LPTI guidelines reviewed keeping total feeding under 30 min. Mom working to increase supplementation volumes as tolerated.   Infant A total feeding 10 ml at 1620  Infant B had recent feeding prior to [facility] arrival taking 16 ml of formula at 1420  All questions answered at the end of the visit.  LC setting up hands free bra when SLP Jeb Levering arrived to do evaluation)  Mom to call for breastfeeding assistance with infant B for next feeding.   Maternal Data Has patient been taught Hand Expression?: Yes  Feeding Mother's Current Feeding Choice: Breast Milk and Formula Nipple Type: Nfant Standard Flow (white)  LATCH Score Latch: Repeated attempts needed to sustain latch, nipple held in mouth throughout feeding, stimulation needed to elicit sucking reflex.  Audible Swallowing: None  Type of Nipple: Everted at rest and  after stimulation  Comfort (Breast/Nipple): Soft / non-tender  Hold (Positioning): Assistance needed to correctly position infant at breast and maintain latch.  LATCH Score: 6   Lactation Tools Discussed/Used Tools: Pump;Flanges;Hands-free pumping top Flange Size: 27 Breast pump type: Other (comment) (personal pump n style) Pump Education: Setup, frequency, and cleaning;Milk Storage Reason for Pumping: increase stimulation Pumping frequency: post pump after each feeding for 15 min  Interventions Interventions: Breast feeding basics reviewed;Assisted with latch;Skin to skin;Breast massage;Hand express;Breast compression;Position options;Expressed milk;DEBP;Education;Pace feeding;Infant Driven Feeding Algorithm education;LPT handout/interventions;Comfort gels  Discharge Pump: Personal;Hands Free WIC Program: No  Consult Status Consult Status: Follow-up Date: 10/27/21 Follow-up type: In-patient    Shara Hartis  [name] 10/26/2021, 4:48 PM

## 2021-10-26 NOTE — Anesthesia Postprocedure Evaluation (Signed)
Anesthesia Post Note  Patient: Nancy Hunter  Procedure(s) Performed: CESAREAN SECTION MULTI-GESTATIONAL APPLICATION OF CELL SAVER (Bilateral)     Patient location during evaluation: PACU Anesthesia Type: Spinal Level of consciousness: awake and alert Pain management: pain level controlled Vital Signs Assessment: post-procedure vital signs reviewed and stable Respiratory status: spontaneous breathing, nonlabored ventilation and respiratory function stable Cardiovascular status: blood pressure returned to baseline and stable Postop Assessment: no apparent nausea or vomiting Anesthetic complications: no   No notable events documented.  Last Vitals:  Vitals:   10/26/21 0100 10/26/21 0523  BP:  115/71  Pulse:  64  Resp: 18 18  Temp: 36.8 C 36.7 C  SpO2: 100% 99%    Last Pain:  Vitals:   10/26/21 0523  TempSrc: Oral  PainSc: 2    Pain Goal:                   [name]

## 2021-10-26 NOTE — Transfer of Care (Signed)
Immediate Anesthesia Transfer of Care Note  Patient: Nancy Hunter  Procedure(s) Performed: CESAREAN SECTION MULTI-GESTATIONAL APPLICATION OF CELL SAVER (Bilateral)  Patient Location: PACU  Anesthesia Type:Spinal  Level of Consciousness: awake, alert  and oriented  Airway & Oxygen Therapy: Patient Spontanous Breathing  Post-op Assessment: Report given to RN and Post -op Vital signs reviewed and stable  Post vital signs: Reviewed and stable  Last Vitals:  Vitals Value Taken Time  BP 115/71 10/26/21 0523  Temp 36.7 C 10/26/21 0523  Pulse 64 10/26/21 0523  Resp 18 10/26/21 0523  SpO2 99 % 10/26/21 0523    Last Pain:  Vitals:   10/26/21 0523  TempSrc: Oral  PainSc: 2          Complications: No notable events documented.

## 2021-10-26 NOTE — Progress Notes (Signed)
Subjective: Postpartum Day 1: Cesarean Delivery Patient reports tolerating PO, + flatus, and no problems voiding.    Objective: Vital signs in last 24 hours: Temp:  [97.6 F (36.4 C)-98.9 F (37.2 C)] 97.6 F (36.4 C) (06/10 0900) Pulse Rate:  [64-82] 64 (06/10 0523) Resp:  [13-19] 18 (06/10 0900) BP: (110-134)/(60-82) 110/79 (06/10 0900) SpO2:  [96 %-100 %] 100 % (06/10 0900) Weight:  [79.8 kg] 79.8 kg (06/09 1528)  Physical Exam:  General: alert, cooperative, and no distress Lochia: appropriate Uterine Fundus: firm Incision: no significant drainage, with pressure dressing on.  DVT Evaluation: No evidence of DVT seen on physical exam. No significant calf/ankle edema.  Recent Labs    10/26/21 0446  HGB 9.3*  HCT 28.0*      Latest Ref Rng & Units 10/26/2021    4:46 AM 10/23/2021   10:22 AM 10/15/2021    3:43 PM  CBC  WBC 4.0 - 10.5 K/uL 11.9  7.5  7.1   Hemoglobin 12.0 - 15.0 g/dL 9.3  33.0  07.6   Hematocrit 36.0 - 46.0 % 28.0  33.5  31.1   Platelets 150 - 400 K/uL 128  131  145      Assessment/Plan: 31 yo [id number] Status post Cesarean section, POD # 1. Doing well postoperatively.  Continue current care. Iron tablets for anemia.  Prescilla Sours, MD.  10/26/2021, 12:52 PM

## 2021-10-27 NOTE — Lactation Note (Signed)
This note was copied from a baby's chart. Lactation Consultation Note  Patient Name: Nancy Hunter [id number]'[id number] Date: 10/27/2021   Age:33 hours   LC Note:  Attempted to follow up with mother, however, she was asleep at this time.  Evening LC will follow up tonight.   Maternal Data    Feeding Nipple Type: Nfant Extra Slow Flow (gold)  LATCH Score                    Lactation Tools Discussed/Used    Interventions    Discharge    Consult Status      [name] 10/27/2021, 5:02 PM

## 2021-10-27 NOTE — Progress Notes (Signed)
Subjective: Postpartum Day 2: Cesarean Delivery Patient reports incisional pain, tolerating PO, + flatus, and no problems voiding.  Incisional pain improves with oxycodone use.   Objective: Vital signs in last 24 hours: Temp:  [97.7 F (36.5 C)] 97.7 F (36.5 C) (06/11 0745) Pulse Rate:  [71-84] 84 (06/11 0745) Resp:  [16-18] 16 (06/11 0745) BP: (102-126)/(75-76) 102/75 (06/11 0745) SpO2:  [100 %] 100 % (06/11 0745)  Physical Exam:  General: alert, cooperative, and no distress Lochia: appropriate Uterine Fundus: firm Incision: no significant drainage DVT Evaluation: No evidence of DVT seen on physical exam. No significant calf/ankle edema.  Recent Labs    10/26/21 0446  HGB 9.3*  HCT 28.0*   Assessment/Plan: 31 yo [id number] Status post Cesarean section, POD # 2. Doing well postoperatively.  Continue current care. Iron tablets for anemia.  Pain medication as needed.  Prescilla Sours, MD.  10/27/2021, 12:47 PM

## 2021-10-27 NOTE — Progress Notes (Signed)
Encouraged patient to take a shower and remove her pressure dressing per Dr. Sallye Ober. Patient states she plans to take a shower this evening. Encouraged patient to call for assistance as needed if she has trouble getting pressure dressing off. Earl Gala, Linda Hedges [place]

## 2021-10-27 NOTE — Lactation Note (Addendum)
This note was copied from a baby's chart. Lactation Consultation Note  Patient Name: Nancy Hunter [id number] Date: 10/27/2021   Age:33 hours LC talked with Mom and for now she is working on formula feeding and increasing volumes for both twins. She would like to suspend [facility] services for now.   Mom states felt stressed with latching, pumping and supplementing with formula. Mom following plan established by SLP and advancing volume as tolerated. Mom also following LPTI guidelines.   Mom encouraged to do what she is able.     Maternal Data    Feeding    LATCH Score                    Lactation Tools Discussed/Used    Interventions    Discharge    Consult Status      [name]  [name] 10/27/2021, 8:05 PM

## 2021-10-28 MED ORDER — IBUPROFEN 600 MG PO TABS: 600.0000 mg | ORAL_TABLET | Freq: Four times a day (QID) | ORAL | 1 refills | Status: DC | PRN

## 2021-10-28 MED ORDER — FERROUS SULFATE 325 (65 FE) MG PO TABS: 325.0000 mg | ORAL_TABLET | Freq: Two times a day (BID) | ORAL | 3 refills | Status: DC

## 2021-10-28 MED ORDER — OXYCODONE HCL 5 MG PO TABS: 5.0000 mg | ORAL_TABLET | Freq: Four times a day (QID) | ORAL | 0 refills | Status: DC | PRN

## 2021-10-28 NOTE — Progress Notes (Signed)
Postpartum Note Day #3  S:  Patient doing well.  Pain controlled and incisional pain is better with pain medication.  Tolerating regular diet.   Ambulating and voiding without difficulty.   Denies fevers, chills, chest pain, SOB, N/V, or worsening bilateral LE edema.  Lochia: Minimal Infant feeding:  Breast/bottle Circumcision:  Desires x 2 prior to discharge Contraception:  Undecided  O: Temp:  [97.7 F (36.5 C)-98.5 F (36.9 C)] 98.5 F (36.9 C) (06/12 0500) Pulse Rate:  [65-72] 72 (06/12 0500) Resp:  [16-18] 18 (06/12 0500) BP: (108-112)/(63-70) 110/68 (06/12 0500) Gen: NAD, pleasant and cooperative Resp: No increased work of breathing Abdomen: soft, non-distended, appropriately tender throughout Uterus: firm, non-tender, below umbilicus Incision: c/d/i, bandage in place  Ext: No bilateral LE edema, no bilateral calf tenderness, SCDs on and working  Labs:  Recent Labs    10/26/21 0446  HGB 9.3*  HCT 28.0*    A/P: Patient is a 31 y.o. [id number] POD#3 s/p LTCS (twins).  - Pain well controlled  - GU: UOP is adequate - GI: Tolerating regular diet - Activity: encouraged sitting up to chair and ambulation as tolerated - DVT Prophylaxis: SCDs, frequent ambulation - Labs: stable as above  Circumcision Consent: Routine circumcisions performed on newborns have been identified as voluntary, elective procedures by [employer] such as the [employer] [facility].  It is considered an elective procedure with no definitive medical indication and carries risks.  Risks include but are not limited to bleeding, infection, damage to penis with possible need for further surgery, poor cosmesis, and local anesthetic risks.  Circumcision will only be performed if patient is deemed to have normal anatomy by his Pediatrician, meets adequate criteria for a newborn of similar gestational age after birth and is without infection or other medical issue contraindicating an elective  procedure.   Patient understands and agrees with above consent Patient discussed with mother of infants.   Disposition:  D/C home today   [name], DO [phone] (office)

## 2021-10-28 NOTE — Discharge Summary (Signed)
Postpartum Discharge Summary  Date of Service: 10/28/21      Patient Name: Nancy Hunter DOB: [birth year] MRN: [id number]  Date of admission: 10/25/2021 Delivery date:   Phyliss, Hulick [[id number]]  10/25/2021    Shelitha, Magley [[id number]]  10/25/2021  Delivering provider:    Lorene Dy Margene [[id number]]  Esli Jernigan    Akiko, Schexnider Laya [[id number]]  [place]  Date of discharge: 10/28/2021  Admitting diagnosis: primary cesarean section for twins Fetal growth restriction of Twin A Sickle cell trait Iron deficiency anemia Intrauterine pregnancy: [redacted]w[redacted]d    Secondary diagnosis:  Active Problems:   Twin pregnancy, dichorionic/diamniotic, third trimester Fetal growth restriction of Twin A Sickle cell trait Iron deficiency anemia Additional problems: PCOS    Discharge diagnosis: Preterm Pregnancy Delivered                                              Post partum procedures: None Augmentation: N/A Complications: None  Hospital course: Sceduled C/S   31y.o. yo [id number] admitted to the hospital 10/25/2021 for scheduled cesarean section with the following indication: Twin gestation, Fetal Growth Restriction of Twin A .Delivery details are as follows:  Membrane Rupture Time/Date:    DiJocabed, Cheese0[[id number]]4:57 PM    DiArletha, Marschkeymone [0[[id number]]4:57 PM ,   DiSharell, Hilmer0[[id number]]10/25/2021    DiPeg, Fifer0[[id number]]10/25/2021   Delivery Method:   DiTimoteo Gaul0[[id number]]C-Section, Low Transverse    DiMable Fillymone [0[[id number]]C-Section, Low Transverse  Details of operation can be found in separate operative note.  Patient had an uncomplicated postpartum course.  She is ambulating, tolerating a regular diet, passing flatus, and urinating well. Patient is discharged home in stable condition on  10/28/21        Newborn Data: Birth date:   DiKashmir, Leedy0[[id number]]10/25/2021    DiKatricia, Prehn[0[[id number]]10/25/2021  Birth time:   DiJannette, Cotham0[[id number]]4:57 PM    DiMable Fillymone [0[[id number]]4:57 PM  Gender:   DiMilli, [place]0[[id number]]Female    [facility][[id number]][facility]  Living status:   DiCoreena, Rubalcava0[[id number]]  MBPJPE    TKKOE, CXFQymone [0[[id number]]Living  Apgars:   DiLyah, Millirons0[[id number]]8 [address] [0[[id number]]8 ,   DiLyndi, Holbeinymone [0[[id number]]9    DiDelesia, Martinekymone [0[[id number]]9  Weight:   DiJamae, Tison0[[id number]]2180 g    DiJennings, Stirlingymone [0[[id number]]2370 g     Magnesium Sulfate received: No BMZ received: No Rhophylac:No MMR:N/A T-DaP: Declines Flu: N/A Transfusion:No  Physical exam  Vitals:   10/27/21 0745 10/27/21 1500 10/27/21 2125 10/28/21 0500  BP: 102/75 112/63 108/70 110/68  Pulse: 84 65 70 72  Resp: _0 Temp: 97.7 F (36.5 C) 97.7 F (36.5 C) 98 F (36.7 C) 98.5 F (36.9 C)  TempSrc: Oral Oral Oral Oral  SpO2: 100%     Weight:      Height:       General: alert, cooperative, and no distress Lochia: appropriate Uterine Fundus: firm Incision: Dressing is clean, dry, and intact DVT Evaluation:  No evidence of DVT seen on physical exam. No cords or calf tenderness. Labs: Lab Results  Component Value Date   WBC 11.9 (H) 10/26/2021   HGB 9.3 (L) 10/26/2021   HCT 28.0 (L) 10/26/2021   MCV 85.4 10/26/2021   PLT 128 (L) 10/26/2021      Latest Ref Rng & Units 01/04/2016    4:28 PM  CMP  Glucose 65 - 99 mg/dL 85   BUN 7 - 25 mg/dL 15   Creatinine 0.50 - 1.10 mg/dL 0.94   Sodium 135 - 146 mmol/L 138   Potassium 3.5 - 5.3 mmol/L 4.2   Chloride 98 - 110 mmol/L 99   CO2 20 - 31 mmol/L 27   Calcium 8.6 - 10.2 mg/dL 10.0    Edinburgh Score:    10/26/2021    6:02 PM  Edinburgh Postnatal Depression Scale Screening Tool  I have been able to laugh and see the funny side of things. 0  I have looked forward with enjoyment to things.  0  I have blamed myself unnecessarily when things went wrong. 0  I have been anxious or worried for no good reason. 0  I have felt scared or panicky for no good reason. 0  Things have been getting on top of me. 0  I have been so unhappy that I have had difficulty sleeping. 0  I have felt sad or miserable. 0  I have been so unhappy that I have been crying. 0  The thought of harming myself has occurred to me. 0  Edinburgh Postnatal Depression Scale Total 0      After visit meds:  Allergies as of 10/28/2021       Reactions   Latex Hives        Medication List     STOP taking these medications    aspirin EC 81 MG tablet       TAKE these medications    diphenhydrAMINE 25 MG tablet Commonly known as: BENADRYL Take 25 mg by mouth daily as needed for allergies.   docusate sodium 100 MG capsule Commonly known as: COLACE Take 100 mg by mouth daily as needed for mild constipation.   ferrous sulfate 325 (65 FE) MG tablet Take 1 tablet (325 mg total) by mouth 2 (two) times daily with a meal.   folic acid 1 MG tablet Commonly known as: FOLVITE Take 1 mg by mouth daily.   ibuprofen 600 MG tablet Commonly known as: ADVIL Take 1 tablet (600 mg total) by mouth every 6 (six) hours as needed for mild pain, moderate pain or cramping.   oxyCODONE 5 MG immediate release tablet Commonly known as: Oxy IR/ROXICODONE Take 1-2 tablets (5-10 mg total) by mouth every 6 (six) hours as needed for moderate pain, severe pain or breakthrough pain.   prenatal multivitamin Tabs tablet Take 1 tablet by mouth daily.         Discharge home in stable condition Infant Feeding: Bottle and Breast Infant Disposition:home with mother Discharge instruction: per After Visit Summary and Postpartum booklet. Activity: Advance as tolerated. Pelvic rest for 6 weeks.  Diet: routine diet Anticipated Birth Control: Unsure Postpartum Appointment:6 weeks Additional Postpartum F/U: Incision check 2  weeks Future Appointments: Future Appointments  Date Time Provider [place]  01/29/2022 11:00 AM [facility] None  01/29/2022 11:30 AM Benay Pike, MD [facility] None   Follow up Visit:  Follow-up Information     Drema Dallas, DO Follow up in 2 week(s).  Specialty: Obstetrics and Gynecology Why: Our office will arrange a 2 week incision check and please keep your previously scheduled 6 week postpartum visit. Contact information: [address] [place] [address] [place] [address] [phone]                     10/28/2021 Drema Dallas, DO

## 2021-10-29 LAB — SURGICAL PATHOLOGY

## 2021-10-30 MED FILL — Heparin Sodium (Porcine) Inj 1000 Unit/ML: INTRAMUSCULAR | Qty: 30 | Status: CN

## 2021-10-30 MED FILL — Sodium Chloride IV Soln 0.9%: INTRAVENOUS | Qty: 1000 | Status: CN

## 2021-10-31 MED FILL — Heparin Sodium (Porcine) Inj 1000 Unit/ML: INTRAMUSCULAR | Qty: 30 | Status: AC

## 2021-10-31 MED FILL — Sodium Chloride IV Soln 0.9%: INTRAVENOUS | Qty: 1000 | Status: AC

## 2021-11-02 ENCOUNTER — Telehealth (HOSPITAL_COMMUNITY): Payer: Self-pay

## 2021-11-02 NOTE — Telephone Encounter (Signed)
No answer. Left message to return nurse call.  Marcelino Duster [facility] 06/17//2023,1640

## 2021-11-04 ENCOUNTER — Encounter: Payer: Self-pay | Admitting: Hematology and Oncology

## 2022-01-28 ENCOUNTER — Other Ambulatory Visit: Payer: Self-pay | Admitting: *Deleted

## 2022-01-28 DIAGNOSIS — D508 Other iron deficiency anemias: Secondary | ICD-10-CM

## 2022-01-29 ENCOUNTER — Other Ambulatory Visit: Payer: Self-pay

## 2022-01-29 ENCOUNTER — Inpatient Hospital Stay (HOSPITAL_BASED_OUTPATIENT_CLINIC_OR_DEPARTMENT_OTHER): Payer: 59 | Admitting: Hematology and Oncology

## 2022-01-29 ENCOUNTER — Encounter: Payer: Self-pay | Admitting: Hematology and Oncology

## 2022-01-29 ENCOUNTER — Inpatient Hospital Stay: Payer: 59 | Attending: Hematology and Oncology

## 2022-01-29 DIAGNOSIS — D508 Other iron deficiency anemias: Secondary | ICD-10-CM

## 2022-01-29 DIAGNOSIS — D509 Iron deficiency anemia, unspecified: Secondary | ICD-10-CM | POA: Insufficient documentation

## 2022-01-29 LAB — CMP (CANCER CENTER ONLY)
ALT: 10 U/L (ref 0–44)
AST: 14 U/L — ABNORMAL LOW (ref 15–41)
Albumin: 4.6 g/dL (ref 3.5–5.0)
Alkaline Phosphatase: 51 U/L (ref 38–126)
Anion gap: 4 — ABNORMAL LOW (ref 5–15)
BUN: 9 mg/dL (ref 6–20)
CO2: 30 mmol/L (ref 22–32)
Calcium: 9.6 mg/dL (ref 8.9–10.3)
Chloride: 105 mmol/L (ref 98–111)
Creatinine: 0.83 mg/dL (ref 0.44–1.00)
GFR, Estimated: >60 mL/min (ref >60)
Glucose, Bld: 93 mg/dL (ref 70–99)
Potassium: 3.5 mmol/L (ref 3.5–5.1)
Sodium: 139 mmol/L (ref 135–145)
Total Bilirubin: 0.5 mg/dL (ref 0.3–1.2)
Total Protein: 7.3 g/dL (ref 6.5–8.1)

## 2022-01-29 LAB — CBC WITH DIFFERENTIAL (CANCER CENTER ONLY)
Abs Immature Granulocytes: 0.03 10*3/uL (ref 0.00–0.07)
Basophils Absolute: 0 10*3/uL (ref 0.0–0.1)
Basophils Relative: 0 %
Eosinophils Absolute: 0.1 10*3/uL (ref 0.0–0.5)
Eosinophils Relative: 1 %
HCT: 35.7 % — ABNORMAL LOW (ref 36.0–46.0)
Hemoglobin: 12.1 g/dL (ref 12.0–15.0)
Immature Granulocytes: 1 %
Lymphocytes Relative: 29 %
Lymphs Abs: 1.5 10*3/uL (ref 0.7–4.0)
MCH: 27.6 pg (ref 26.0–34.0)
MCHC: 33.9 g/dL (ref 30.0–36.0)
MCV: 81.5 fL (ref 80.0–100.0)
Monocytes Absolute: 0.4 10*3/uL (ref 0.1–1.0)
Monocytes Relative: 7 %
Neutro Abs: 3.3 10*3/uL (ref 1.7–7.7)
Neutrophils Relative %: 62 %
Platelet Count: 192 10*3/uL (ref 150–400)
RBC: 4.38 MIL/uL (ref 3.87–5.11)
RDW: 13 % (ref 11.5–15.5)
WBC Count: 5.2 10*3/uL (ref 4.0–10.5)
nRBC: 0 % (ref 0.0–0.2)

## 2022-01-29 LAB — SAMPLE TO BLOOD BANK

## 2022-01-29 LAB — FERRITIN: Ferritin: 64 ng/mL (ref 11–307)

## 2022-01-29 NOTE — Progress Notes (Signed)
[facility] CONSULT NOTE  Patient Care Team: Pcp, No as PCP - General  CHIEF COMPLAINTS/PURPOSE OF CONSULTATION:  IDA  ASSESSMENT & PLAN:   IDA (iron deficiency anemia) This is a very pleasant 31 year old female patient currently 3 months postpartum who is here for follow-up.  Since she gave birth, she has been feeling really well.  No complaints at all.  Physical examination unremarkable.  She is currently not breast-feeding.  She is taking oral iron supplementation. Labs from today show complete correction of hemoglobin, iron panel and ferritin pending.  I will send her a MyChart message based on the iron panel and ferritin results to see if she will benefit from additional iron supplementation.  She expressed understanding of the recommendations.  She will return to clinic in 1 year.    HISTORY OF PRESENTING ILLNESS:  Nancy Hunter 31 y.o. female is here because of IDA  This is a very pleasant 31 year old female patient with no significant past medical history currently pregnant with twins at about 30 weeks of gestation referred to hematology for consideration of intravenous iron given ongoing iron deficiency anemia.   She received iron during pregnancy and tolerated it very well.  She is now postpartum and is here for follow-up.  Since last visit, she gave birth to healthy twin boys.  They are currently 70 months old.  She feels back to normal.  She denies any new health issues.  Her energy levels are great.  She has been taking oral iron every day.   She is currently not breast-feeding.  Rest of the pertinent 10 point ROS reviewed and negative.  MEDICAL HISTORY:  Past Medical History:  Diagnosis Date   Anemia    Anxiety    Chronic back pain    Depression    PCOS (polycystic ovarian syndrome)     SURGICAL HISTORY: Past Surgical History:  Procedure Laterality Date   CESAREAN SECTION MULTI-GESTATIONAL N/A 10/25/2021   Procedure: CESAREAN SECTION  MULTI-GESTATIONAL;  Surgeon: [name], DO;  Location: [facility] LD ORS;  Service: Obstetrics;  Laterality: N/A;  Request OB Fellow   COLPOSCOPY     ECTOPIC PREGNANCY SURGERY  04/09/2016   WISDOM TOOTH EXTRACTION      SOCIAL HISTORY: Social History   Socioeconomic History   Marital status: Significant Other    Spouse name: Not on file   Number of children: Not on file   Years of education: Not on file   Highest education level: Not on file  Occupational History   Not on file  Tobacco Use   Smoking status: Former    Types: Cigarettes    Quit date: 10/24/2020    Years since quitting: 1.2   Smokeless tobacco: Never  Vaping Use   Vaping Use: Former   Quit date: 10/24/2020  Substance and Sexual Activity   Alcohol use: Not Currently    Comment: not while preg   Drug use: No   Sexual activity: Yes    Birth control/protection: None  Other Topics Concern   Not on file  Social History Narrative   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, community   Social Determinants of Corporate investment banker Strain: Not on file  Food Insecurity: Not on file  Transportation Needs: Not on file  Physical Activity: Not on file  Stress: Not on file  Social Connections: Not on file  Intimate Partner Violence: Not on file    FAMILY HISTORY: Family History  Problem Relation  Age of Onset   Kidney disease Maternal Aunt    Breast cancer Maternal Aunt    Hypertension Maternal Grandmother    Hyperlipidemia Maternal Grandmother    Arthritis Maternal Grandmother    Diabetes Maternal Grandfather    Hypertension Maternal Grandfather     ALLERGIES:  is allergic to latex.  MEDICATIONS:  Current Outpatient Medications  Medication Sig Dispense Refill   diphenhydrAMINE (BENADRYL) 25 MG tablet Take 25 mg by mouth daily as needed for allergies.     docusate sodium (COLACE) 100 MG capsule Take 100 mg by mouth daily as needed for mild constipation.     ferrous sulfate 325 (65 FE) MG tablet Take 1 tablet (325 mg  total) by mouth 2 (two) times daily with a meal. 60 tablet 3   folic acid (FOLVITE) 1 MG tablet Take 1 mg by mouth daily.     ibuprofen (ADVIL) 600 MG tablet Take 1 tablet (600 mg total) by mouth every 6 (six) hours as needed for mild pain, moderate pain or cramping. 30 tablet 1   oxyCODONE (OXY IR/ROXICODONE) 5 MG immediate release tablet Take 1-2 tablets (5-10 mg total) by mouth every 6 (six) hours as needed for moderate pain, severe pain or breakthrough pain. 30 tablet 0   Prenatal Vit-Fe Fumarate-FA (PRENATAL MULTIVITAMIN) TABS tablet Take 1 tablet by mouth daily.     No current facility-administered medications for this visit.     PHYSICAL EXAMINATION: ECOG PERFORMANCE STATUS: 0 - Asymptomatic  Vitals:   01/29/22 1148  BP: 135/86  Pulse: 60  Resp: 16  Temp: 98.1 F (36.7 C)  SpO2: 100%   Filed Weights    Physical Exam Constitutional:      Appearance: Normal appearance.  Musculoskeletal:        General: No swelling.     Cervical back: Normal range of motion and neck supple. No rigidity.  Lymphadenopathy:     Cervical: No cervical adenopathy.  Neurological:     Mental Status: She is alert.  Psychiatric:        Mood and Affect: Mood normal.     LABORATORY DATA:  I have reviewed the data as listed Lab Results  Component Value Date   WBC 5.2 01/29/2022   HGB 12.1 01/29/2022   HCT 35.7 (L) 01/29/2022   MCV 81.5 01/29/2022   PLT 192 01/29/2022     Chemistry      Component Value Date/Time   NA 139 01/29/2022 1131   K 3.5 01/29/2022 1131   CL 105 01/29/2022 1131   CO2 30 01/29/2022 1131   BUN 9 01/29/2022 1131   CREATININE 0.83 01/29/2022 1131   CREATININE 0.94 01/04/2016 1628      Component Value Date/Time   CALCIUM 9.6 01/29/2022 1131   ALKPHOS 51 01/29/2022 1131   AST 14 (L) 01/29/2022 1131   ALT 10 01/29/2022 1131   BILITOT 0.5 01/29/2022 1131      All questions were answered. The patient knows to call the clinic with any problems, questions or  concerns. I spent 20 minutes in the care of this patient including H and P, review of records, counseling and coordination of care.     Rachel Moulds, MD 01/29/2022 2:30 PM

## 2022-01-29 NOTE — Assessment & Plan Note (Signed)
This is a very pleasant 31 year old female patient currently 3 months postpartum who is here for follow-up.  Since she gave birth, she has been feeling really well.  No complaints at all.  Physical examination unremarkable.  She is currently not breast-feeding.  She is taking oral iron supplementation. Labs from today show complete correction of hemoglobin, iron panel and ferritin pending.  I will send her a MyChart message based on the iron panel and ferritin results to see if she will benefit from additional iron supplementation.  She expressed understanding of the recommendations.  She will return to clinic in 1 year.

## 2022-09-10 ENCOUNTER — Encounter: Payer: Self-pay | Admitting: Hematology and Oncology

## 2022-10-14 ENCOUNTER — Encounter: Payer: Self-pay | Admitting: Hematology and Oncology

## 2022-10-23 ENCOUNTER — Ambulatory Visit: Payer: 59 | Admitting: Podiatry

## 2022-10-23 DIAGNOSIS — M778 Other enthesopathies, not elsewhere classified: Secondary | ICD-10-CM

## 2022-10-27 IMAGING — US US MFM FETAL BPP W/ NON-STRESS
1 series · 13 of 28 positions shown · non-contrast
Comparison: none

[Series 1: us mfm fetal bpp w/ non-stress · 39 acquisitions, 13 frames shown]
[im 2/39]
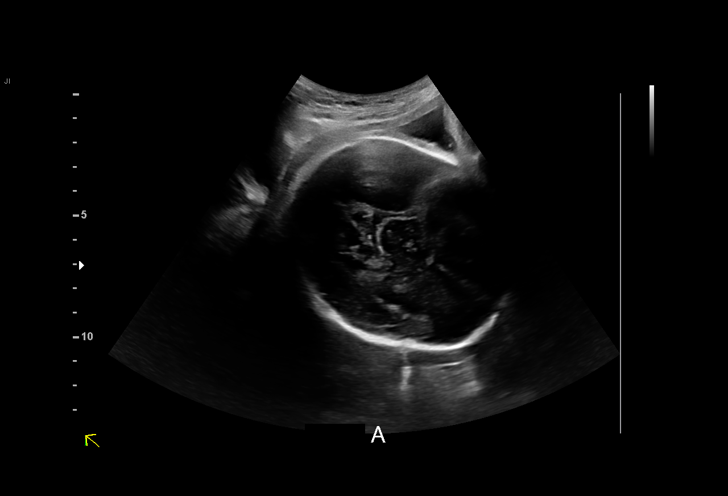
[im 5/39]
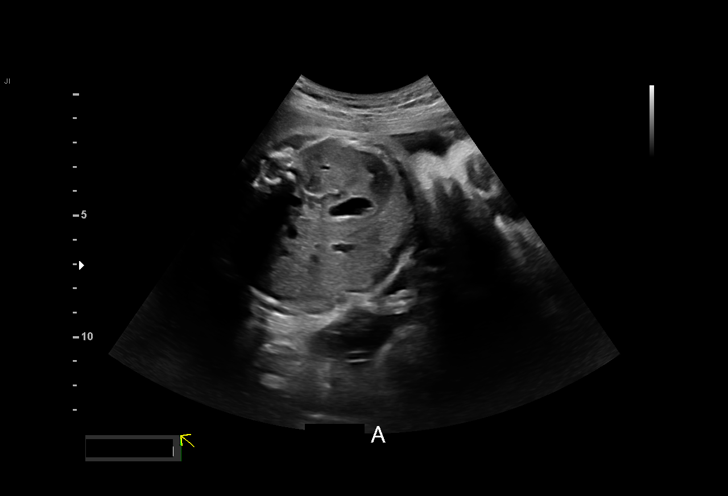
[im 8/39]
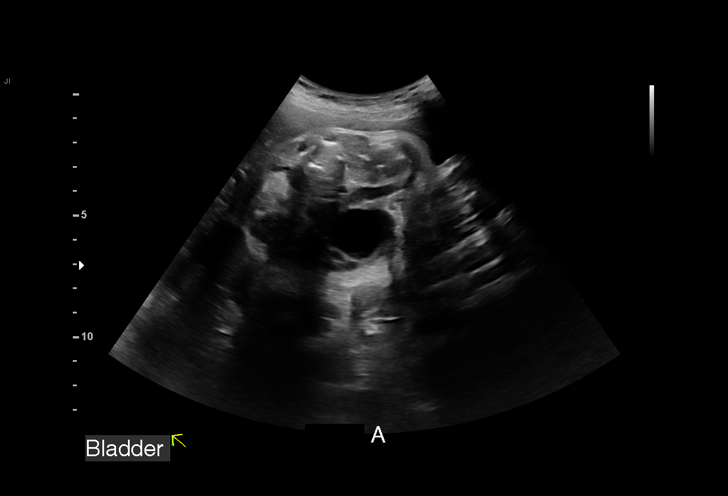
[im 10/39]
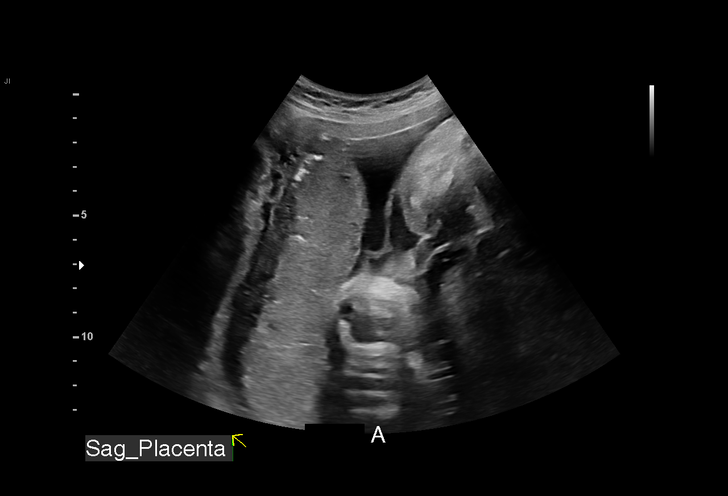
[im 13/39]
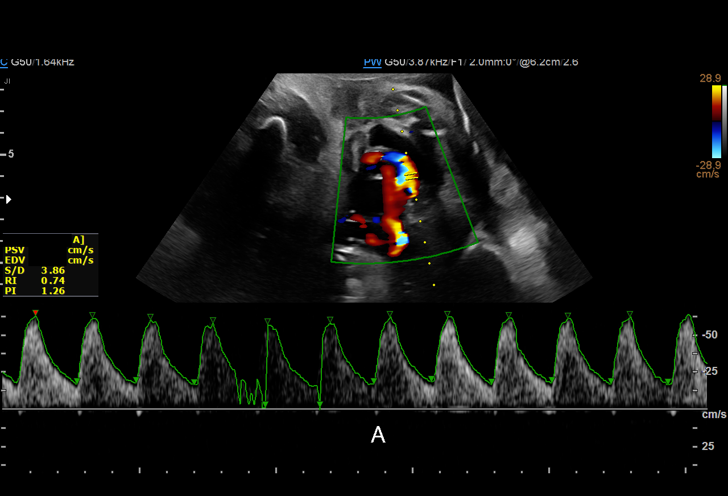
[im 16/39]
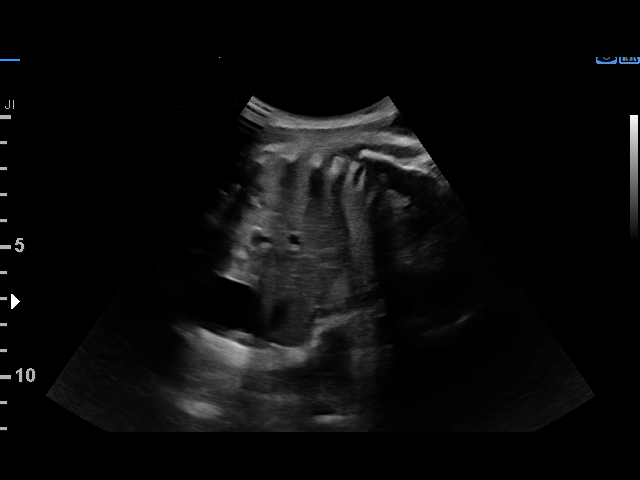
[im 20/39]
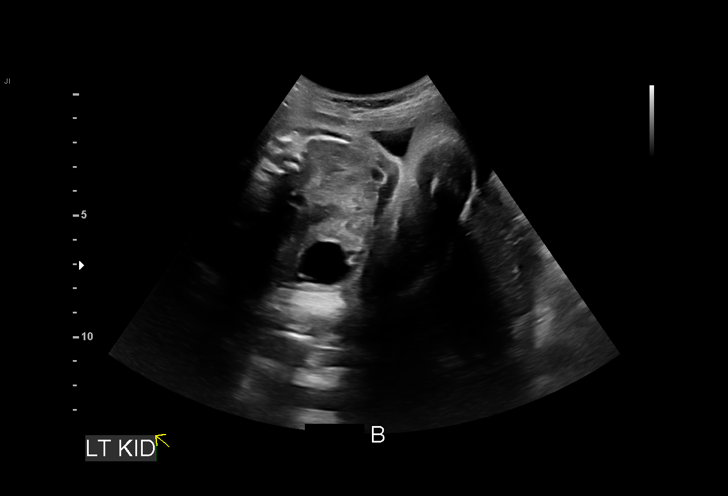
[im 23/39]
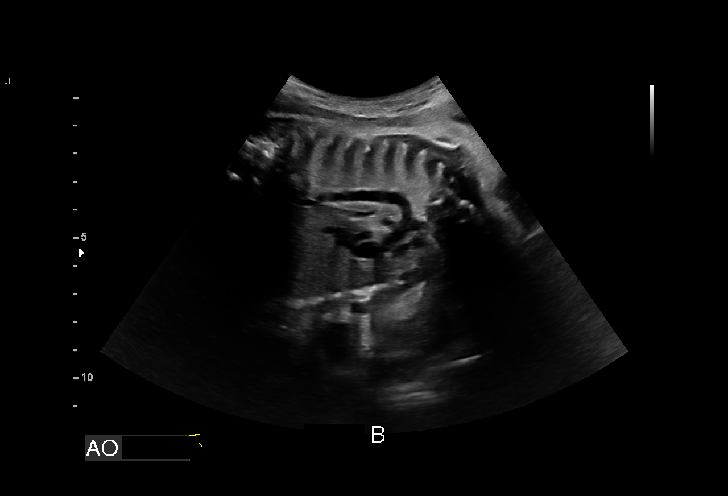
[im 26/39]
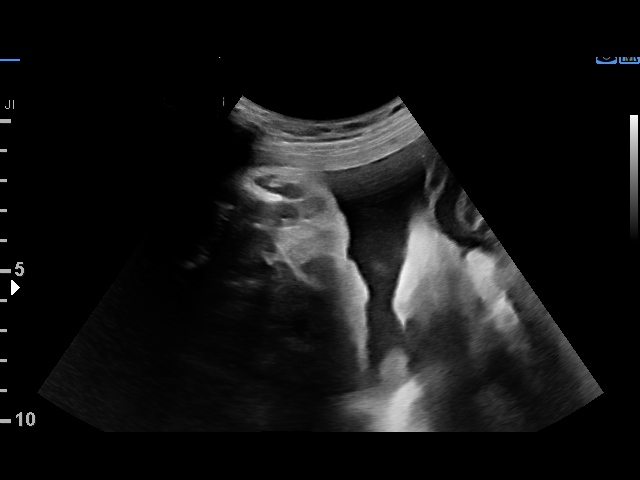
[im 29/39]
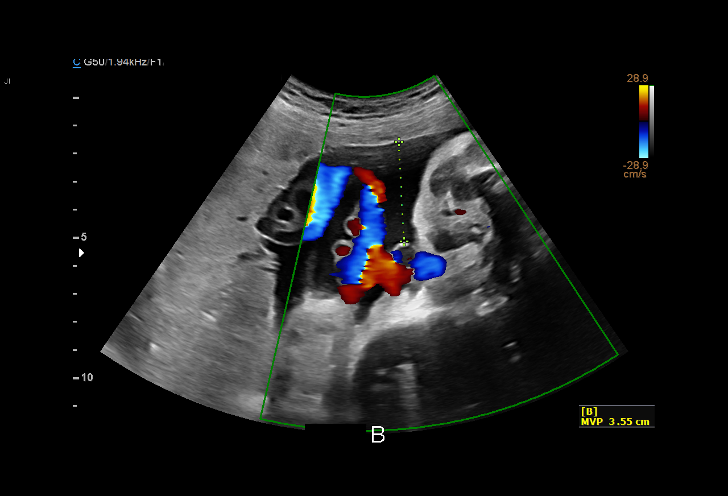
[im 31/39]
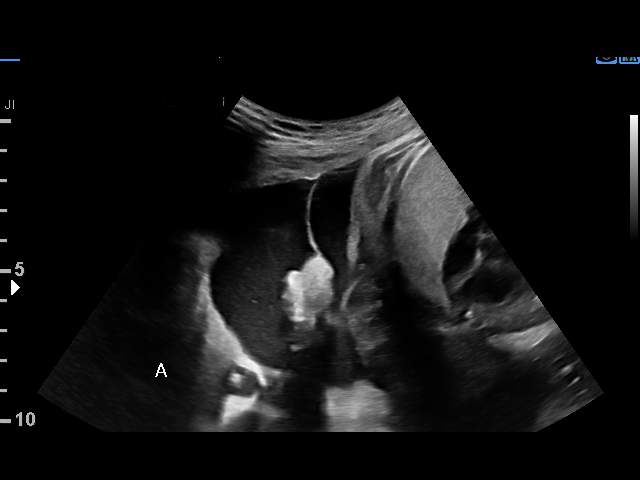
[im 34/39]
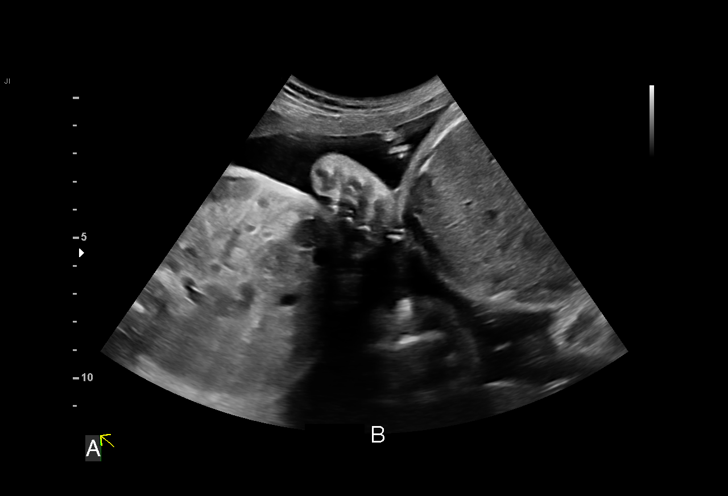
[im 37/39]
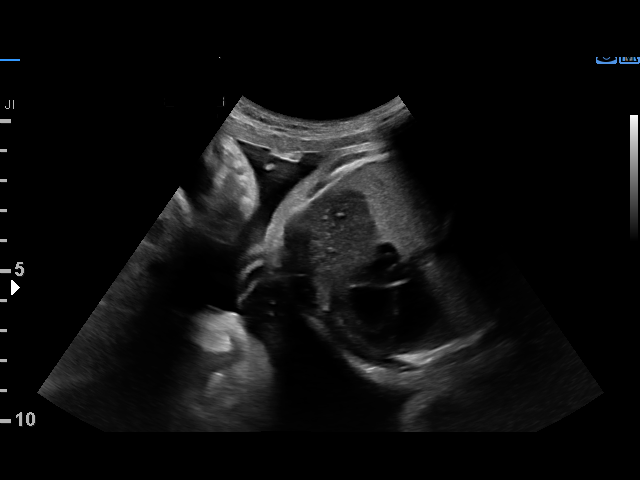

[13 of 28 positions shown; findings below may reference images not displayed]

[REDACTED]
                                                            Ave., [HOSPITAL]

    W/NONSTRESS
    W/NONSTRESS ADD'L GEST

Indications

 33 weeks gestation of pregnancy
 Maternal care for known or suspected poor
 fetal growth, third trimester, fetus 1 IUGR
 (Baby A)
 Twin pregnancy, di/di, third trimester
 Pregnancy resulting from assisted
 reproductive technology (TIC/Ovidrel)
 Medical complication of pregnancy (PCOS)
 History of sickle cell trait
Fetal Evaluation (Fetus A)

 Num Of Fetuses:         2
 Fetal Heart Rate(bpm):  125
 Cardiac Activity:       Observed
 Fetal Lie:              Maternal right side
 Presentation:           Cephalic
 Placenta:               Posterior
 P. Cord Insertion:      Previously Visualized
 Membrane Desc:      Dividing Membrane seen
 Amniotic Fluid
 AFI FV:      Within normal limits
Biophysical Evaluation (Fetus A)

 Amniotic F.V:   Within normal limits       F. Tone:        Observed
 F. Movement:    Observed                   N.S.T:          Reactive
 F. Breathing:   Observed                   Score:          [DATE]
OB History

 Gravidity:    4         Term:   1        Prem:   0        SAB:   2
 TOP:          0       Ectopic:  0        Living: 1
Gestational Age (Fetus A)

 LMP:           32w 3d        Date:  02/17/21                 EDD:   11/24/21
 Best:          33w 3d     Det. By:  Early Ultrasound         EDD:   11/17/21
                                     (03/29/21)
Anatomy (Fetus A)

 Cranium:               Appears normal         Stomach:                Appears normal, left
                                                                       sided
 Heart:                 Previously seen        Kidneys:                Appear normal
Doppler - Fetal Vessels (Fetus A)

 Umbilical Artery
  S/D     %tile      RI    %tile      PI    %tile            ADFV    RDFV
  3.86       96    0.74       95    1.26       97               No      No

Fetal Evaluation (Fetus B)

 Num Of Fetuses:         2
 Fetal Heart Rate(bpm):  160
 Cardiac Activity:       Observed
 Fetal Lie:              Maternal left side
 Presentation:           Cephalic
 Placenta:               Posterior
 P. Cord Insertion:      Visualized, central
 Membrane Desc:      Dividing Membrane seen

 Amniotic Fluid
 AFI FV:      Within normal limits

                             Largest Pocket(cm)

Biophysical Evaluation (Fetus B)

 Amniotic F.V:   Within normal limits       F. Tone:        Observed
 F. Movement:    Observed                   N.S.T:          Reactive
 F. Breathing:   Observed                   Score:          [DATE]
Gestational Age (Fetus B)
 LMP:           32w 3d        Date:  02/17/21                 EDD:   11/24/21
 Best:          33w 3d     Det. By:  Early Ultrasound         EDD:   11/17/21
                                     (03/29/21)
Anatomy (Fetus B)

 Cranium:               Appears normal         Kidneys:                Appear normal
 Heart:                 Appears normal
                        (4CH, axis, and
                        situs)
Doppler - Fetal Vessels (Fetus B)

 Umbilical Artery
  S/D     %tile      RI    %tile      PI    %tile            ADFV    RDFV
  3.67       94    0.73       94    1.24       96               No      No

Cervix Uterus Adnexa

 Cervix
 Not visualized (advanced GA >80wks)

 Uterus
 No abnormality visualized.

 Right Ovary
 Not visualized.

 Cul De Sac
 No free fluid seen.

 Adnexa
 No abnormality visualized.
Impression

 Antenatal testing for dichorionic diamnoitic twin pregnancy
 with FGR in Twin A.
 Twin A normal stomach, amniotic fluid, bladder and BPP
 [DATE]- Maternal right, Cephalic
 Twin B normal stomach, amniotic fluid, bladder and BPP
 [DATE]- Maternal left, Cephalic
 UA Dopplers are elevated in twin A without evidence of AEDF
 or REDF.
 The UA Dopplers are at the upper limits of normal without
 evidence of AEDF or REDF
Recommendations

 Continue weekly testing with UA Dopplers.

## 2022-11-04 ENCOUNTER — Ambulatory Visit: Payer: 59 | Admitting: Podiatry

## 2022-11-04 DIAGNOSIS — M7750 Other enthesopathy of unspecified foot: Secondary | ICD-10-CM

## 2022-12-01 ENCOUNTER — Emergency Department (HOSPITAL_COMMUNITY): Payer: 59

## 2022-12-01 ENCOUNTER — Other Ambulatory Visit: Payer: Self-pay

## 2022-12-01 ENCOUNTER — Encounter (HOSPITAL_COMMUNITY): Payer: Self-pay

## 2022-12-01 ENCOUNTER — Encounter: Payer: Self-pay | Admitting: Hematology and Oncology

## 2022-12-01 ENCOUNTER — Emergency Department (HOSPITAL_COMMUNITY)
Admission: EM | Admit: 2022-12-01 | Discharge: 2022-12-01 | Disposition: A | Discharge status: Home / Self Care | Payer: 59 | Attending: Emergency Medicine | Admitting: Emergency Medicine

## 2022-12-01 DIAGNOSIS — R079 Chest pain, unspecified: Secondary | ICD-10-CM | POA: Diagnosis present

## 2022-12-01 DIAGNOSIS — R0602 Shortness of breath: Secondary | ICD-10-CM | POA: Diagnosis not present

## 2022-12-01 DIAGNOSIS — R109 Unspecified abdominal pain: Secondary | ICD-10-CM | POA: Diagnosis not present

## 2022-12-01 DIAGNOSIS — R0789 Other chest pain: Secondary | ICD-10-CM | POA: Diagnosis not present

## 2022-12-01 DIAGNOSIS — R519 Headache, unspecified: Secondary | ICD-10-CM | POA: Insufficient documentation

## 2022-12-01 DIAGNOSIS — R531 Weakness: Secondary | ICD-10-CM | POA: Diagnosis not present

## 2022-12-01 DIAGNOSIS — R202 Paresthesia of skin: Secondary | ICD-10-CM | POA: Insufficient documentation

## 2022-12-01 DIAGNOSIS — R55 Syncope and collapse: Secondary | ICD-10-CM | POA: Insufficient documentation

## 2022-12-01 DIAGNOSIS — F444 Conversion disorder with motor symptom or deficit: Secondary | ICD-10-CM

## 2022-12-01 DIAGNOSIS — Z91040 Latex allergy status: Secondary | ICD-10-CM | POA: Insufficient documentation

## 2022-12-01 DIAGNOSIS — R410 Disorientation, unspecified: Secondary | ICD-10-CM | POA: Insufficient documentation

## 2022-12-01 DIAGNOSIS — R569 Unspecified convulsions: Secondary | ICD-10-CM

## 2022-12-01 LAB — CBC
HCT: 29.5 % — ABNORMAL LOW (ref 36.0–46.0)
Hemoglobin: 9.7 g/dL — ABNORMAL LOW (ref 12.0–15.0)
MCH: 27.1 pg (ref 26.0–34.0)
MCHC: 32.9 g/dL (ref 30.0–36.0)
MCV: 82.4 fL (ref 80.0–100.0)
Platelets: 147 10*3/uL — ABNORMAL LOW (ref 150–400)
RBC: 3.58 MIL/uL — ABNORMAL LOW (ref 3.87–5.11)
RDW: 12.8 % (ref 11.5–15.5)
WBC: 5.4 10*3/uL (ref 4.0–10.5)
nRBC: 0.4 % — ABNORMAL HIGH (ref 0.0–0.2)

## 2022-12-01 LAB — CBG MONITORING, ED: Glucose-Capillary: 89 mg/dL (ref 70–99)

## 2022-12-01 LAB — I-STAT CHEM 8, ED
BUN: 9 mg/dL (ref 6–20)
Calcium, Ion: 0.91 mmol/L — ABNORMAL LOW (ref 1.15–1.40)
Chloride: 111 mmol/L (ref 98–111)
Creatinine, Ser: 0.5 mg/dL (ref 0.44–1.00)
Glucose, Bld: 70 mg/dL (ref 70–99)
HCT: 30 % — ABNORMAL LOW (ref 36.0–46.0)
Hemoglobin: 10.2 g/dL — ABNORMAL LOW (ref 12.0–15.0)
Potassium: 4.9 mmol/L (ref 3.5–5.1)
Sodium: 140 mmol/L (ref 135–145)
TCO2: 20 mmol/L — ABNORMAL LOW (ref 22–32)

## 2022-12-01 LAB — COMPREHENSIVE METABOLIC PANEL
ALT: 24 U/L (ref 0–44)
AST: 42 U/L — ABNORMAL HIGH (ref 15–41)
Albumin: 3.2 g/dL — ABNORMAL LOW (ref 3.5–5.0)
Alkaline Phosphatase: 35 U/L — ABNORMAL LOW (ref 38–126)
Anion gap: 12 (ref 5–15)
BUN: 9 mg/dL (ref 6–20)
CO2: 15 mmol/L — ABNORMAL LOW (ref 22–32)
Calcium: 7.4 mg/dL — ABNORMAL LOW (ref 8.9–10.3)
Chloride: 110 mmol/L (ref 98–111)
Creatinine, Ser: 0.77 mg/dL (ref 0.44–1.00)
GFR, Estimated: >60 mL/min (ref >60)
Glucose, Bld: 67 mg/dL — ABNORMAL LOW (ref 70–99)
Potassium: 5 mmol/L (ref 3.5–5.1)
Sodium: 137 mmol/L (ref 135–145)
Total Bilirubin: 1.5 mg/dL — ABNORMAL HIGH (ref 0.3–1.2)
Total Protein: 5.3 g/dL — ABNORMAL LOW (ref 6.5–8.1)

## 2022-12-01 LAB — DIFFERENTIAL
Abs Immature Granulocytes: 0.06 10*3/uL (ref 0.00–0.07)
Basophils Absolute: 0 10*3/uL (ref 0.0–0.1)
Basophils Relative: 0 %
Eosinophils Absolute: 0 10*3/uL (ref 0.0–0.5)
Eosinophils Relative: 0 %
Immature Granulocytes: 1 %
Lymphocytes Relative: 20 %
Lymphs Abs: 1.1 10*3/uL (ref 0.7–4.0)
Monocytes Absolute: 0.4 10*3/uL (ref 0.1–1.0)
Monocytes Relative: 8 %
Neutro Abs: 3.8 10*3/uL (ref 1.7–7.7)
Neutrophils Relative %: 71 %

## 2022-12-01 LAB — PROTIME-INR
INR: 1.1 (ref 0.8–1.2)
Prothrombin Time: 14.7 seconds (ref 11.4–15.2)

## 2022-12-01 LAB — TROPONIN I (HIGH SENSITIVITY)
Troponin I (High Sensitivity): <2 ng/L (ref <18)
Troponin I (High Sensitivity): <2 ng/L (ref <18)

## 2022-12-01 LAB — APTT: aPTT: 30 seconds (ref 24–36)

## 2022-12-01 LAB — ETHANOL: Alcohol, Ethyl (B): <10 mg/dL (ref <10)

## 2022-12-01 LAB — HCG, SERUM, QUALITATIVE: Preg, Serum: NEGATIVE

## 2022-12-01 MED ORDER — IOHEXOL 350 MG/ML SOLN
100.0000 mL | Freq: Once | INTRAVENOUS | Status: AC | PRN
  Administered 2022-12-01: 100 mL via INTRAVENOUS

## 2022-12-01 MED ORDER — ACETAMINOPHEN 500 MG PO TABS
1000.0000 mg | ORAL_TABLET | Freq: Once | ORAL | Status: AC
  Administered 2022-12-01: 1000 mg via ORAL
  Filled 2022-12-01: qty 2

## 2022-12-01 MED ORDER — KETOROLAC TROMETHAMINE 15 MG/ML IJ SOLN
15.0000 mg | Freq: Once | INTRAMUSCULAR | Status: AC
  Administered 2022-12-01: 15 mg via INTRAVENOUS
  Filled 2022-12-01: qty 1

## 2022-12-01 MED ORDER — FENTANYL CITRATE PF 50 MCG/ML IJ SOSY
25.0000 ug | PREFILLED_SYRINGE | INTRAMUSCULAR | Status: DC | PRN
  Administered 2022-12-01: 25 ug via INTRAVENOUS

## 2022-12-01 MED ORDER — SODIUM CHLORIDE 0.9% FLUSH: 3.0000 mL | Freq: Once | INTRAVENOUS | Status: DC

## 2022-12-01 MED ORDER — FENTANYL CITRATE PF 50 MCG/ML IJ SOSY
PREFILLED_SYRINGE | INTRAMUSCULAR | Status: AC
  Filled 2022-12-01: qty 1

## 2022-12-01 NOTE — ED Notes (Signed)
Swallow screen was completed before medication was given

## 2022-12-01 NOTE — ED Provider Notes (Signed)
32 yo female presenting initially as code stroke with left sided chest and arm pain and weakness, and chest pain x 2 days  CTA dissection study negative MRI without acute stroke Stat EEG ordered per neurology - no concerning findings  Issues pending troponin levels - lab has been contacted regarding missing labs  Physical Exam  BP 128/77   Pulse 69   Temp 98 F (36.7 C) (Oral)   Resp 14   Wt 56 kg   LMP  (LMP Unknown)   SpO2 100%   Breastfeeding Unknown   BMI 20.54 kg/m   Physical Exam  Procedures  Procedures  ED Course / MDM   Clinical Course as of 12/01/22 1525  Mon Dec 01, 2022  1215 CTH NAICP [HN]  1235 Normal CTA H&N, CT dissection protocol. [HN]  1240 Pt not in room, pateitn in MRI [HN]  1306 MR BRAIN WO CONTRAST Normal [HN]  1417 Pt reevaluated. Her L arm weakness is resolved but her L sided chest pain is still present. Rated 8/10, worse with inspiration.  [HN]    Clinical Course User Index [HN] Loetta Rough, MD   Medical Decision Making Amount and/or Complexity of Data Reviewed Labs: ordered. Radiology: ordered. Decision-making details documented in ED Course.  Risk OTC drugs. Prescription drug management.   Patient was reassessed after nearly 10 hours observation in the ED.  Her vital signs have remained unremarkable.  Her neurology workup was also unremarkable.  I question whether this may have been a complex migraine given her left-sided symptoms earlier today.  She continues to have some mild pleuritic left-sided chest pain but is PERC negative, and with a negative CT angiogram performed, there is no evidence of large central PE (although this was noted to be a vascular protocol).  I do believe at this time she is stable for discharge.  I would recommend that she attempt NSAIDs at home as she has not tried any over-the-counter medications for her symptoms.  She may be experiencing costochondritis.  I do not see evidence of pericarditis, pneumonia,  sepsis, other life-threatening infection.  I do think a referral to neurology be reasonable for possible complex migraine.  The patient and her partner at the bedside both verbalized understanding.       Terald Sleeper, MD 12/01/22 2218

## 2022-12-01 NOTE — ED Provider Notes (Signed)
[place] EMERGENCY DEPARTMENT AT [facility] Provider Note   CSN: [id number] Arrival date & time: 12/01/22  1138  An emergency department physician performed an initial assessment on this suspected stroke patient at 1142.  History  Chief Complaint  Patient presents with   Code Stroke    Nancy Hunter is a 32 y.o. female with PMH as listed below who presents BIBEMS as a code stroke. 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 She began having difficulty catching her breath and had a syncopal event. She did not fall and did not hit her head. She then was unable to move her left arm and had diminished sensation on the left side. Patient p/w left sided arm weakness/numbness/tingling as well as disorientation, feeling as though she couldn't speak, and left-sided chest pain that started 2 days ago. Worse with inspiration. No h/o similar sxs. Also c/o very mild all-over headache. No recent f/c. No EtOH or illicit drugs, no h/o seizure/DVT/PE.   Past Medical History:  Diagnosis Date   Anemia    Anxiety    Chronic back pain    Depression    PCOS (polycystic ovarian syndrome)        Home Medications Prior to Admission medications   Medication Sig Start Date End Date Taking? Authorizing Provider  diphenhydrAMINE (BENADRYL) 25 MG tablet Take 25 mg by mouth daily as needed for allergies.    [provider]  docusate sodium (COLACE) 100 MG capsule Take 100 mg by mouth daily as needed for mild constipation.    [provider]  ferrous sulfate 325 (65 FE) MG tablet Take 1 tablet (325 mg total) by mouth 2 (two) times daily with a meal. 10/28/21   [name], DO  folic acid (FOLVITE) 1 MG tablet Take 1 mg by mouth daily.    [provider]  ibuprofen (ADVIL) 600 MG tablet Take 1 tablet (600 mg total) by mouth every 6 (six) hours as needed for mild pain, moderate pain or cramping. 10/28/21   [name], DO  oxyCODONE (OXY IR/ROXICODONE) 5 MG immediate release tablet Take 1-2 tablets (5-10 mg total) by mouth every 6 (six) hours as needed for moderate pain, severe pain or breakthrough pain. 10/28/21   [name], DO  Prenatal Vit-Fe Fumarate-FA (PRENATAL MULTIVITAMIN) TABS tablet Take 1 tablet by mouth daily.    [provider]      Allergies    Latex    Review of Systems   Review of Systems A 10 point review of systems was performed and is negative unless otherwise reported in HPI.  Physical Exam Updated Vital Signs BP 128/77   Pulse 69   Temp 98 F (36.7 C) (Oral)   Resp 14   Wt 56 kg   LMP  (LMP Unknown)   SpO2 100%   Breastfeeding Unknown   BMI 20.54 kg/m  Physical Exam General: Normal appearing female, lying in bed.  HEENT: PERRLA, EOMI, no nystagmus, Sclera anicteric, MMM, trachea midline. Tongue protrudes midline. Cardiology: RRR, no murmurs/rubs/gallops. BL radial and DP pulses equal bilaterally.  Resp: Normal respiratory rate and effort. CTAB, no wheezes, rhonchi, crackles.  Abd: Soft, non-tender, non-distended. No rebound tenderness or guarding.  GU: Deferred. MSK: No peripheral edema or signs of trauma. Extremities without deformity or TTP. No cyanosis or clubbing. Skin: warm, dry.  Neuro: A&Ox4, CNs II-XII grossly intact. .5/5 strength to right upper and  right lower extremities, 2/5 strength to left upper extremity, 4+/5 strength to left lower extremity . Sensation diminished on the left.  Psych: Normal mood and affect.   1a  Level of consciousness: 1 initially  1b. LOC questions:  0=Performs both tasks correctly  1c. LOC commands: 0=Performs both tasks correctly  2.  Best Gaze: 0=normal  3.  Visual: 0=No visual loss  4. Facial Palsy: 0=Normal symmetric movement  5a.  Motor left arm: 2=Some effort against gravity, limb cannot get to or maintain (if cured) 90 (or 45) degrees, drifts down to bed, but has some effort against gravity  5b.   Motor right arm: 0=No drift, limb holds 90 (or 45) degrees for full 10 seconds  6a. motor left leg: 0=No drift, limb holds 90 (or 45) degrees for full 10 seconds  6b  Motor right leg:  0=No drift, limb holds 90 (or 45) degrees for full 10 seconds  7. Limb Ataxia: 0=Absent  8.  Sensory: 1=Mild to moderate sensory loss; patient feels pinprick is less sharp or is dull on the affected side; there is a loss of superficial pain with pinprick but patient is aware She is being touched  9. Best Language:  0=No aphasia, normal  10. Dysarthria: 0=Normal  11. Extinction and Inattention: 0=No abnormality   Total:   4        ED Results / Procedures / Treatments   Labs (all labs ordered are listed, but only abnormal results are displayed) Labs Reviewed  CBC - Abnormal; Notable for the following components:      Result Value   RBC 3.58 (*)    Hemoglobin 9.7 (*)    HCT 29.5 (*)    Platelets 147 (*)    nRBC 0.4 (*)    All other components within normal limits  COMPREHENSIVE METABOLIC PANEL - Abnormal; Notable for the following components:   CO2 15 (*)    Glucose, Bld 67 (*)    Calcium 7.4 (*)    Total Protein 5.3 (*)    Albumin 3.2 (*)    AST 42 (*)    Alkaline Phosphatase 35 (*)    Total Bilirubin 1.5 (*)    All other components within normal limits  I-STAT CHEM 8, ED - Abnormal; Notable for the following components:   Calcium, Ion 0.91 (*)    TCO2 20 (*)    Hemoglobin 10.2 (*)    HCT 30.0 (*)    All other components within normal limits  DIFFERENTIAL  ETHANOL  HCG, SERUM, QUALITATIVE  PROTIME-INR  APTT  HCG, QUANTITATIVE, PREGNANCY  CBG MONITORING, ED    EKG EKG Interpretation Date/Time:  Monday December 01 2022 12:59:37 EDT Ventricular Rate:  71 PR Interval:  163 QRS Duration:  87 QT Interval:  374 QTC Calculation: 407 R Axis:   69  Text Interpretation: Sinus rhythm Borderline T abnormalities, inferior leads Confirmed by Vivi Barrack [phone]) on 12/01/2022 2:18:47  PM  Radiology MR BRAIN WO CONTRAST  Result Date: 12/01/2022 CLINICAL DATA:  Left-sided weakness EXAM: MRI HEAD WITHOUT CONTRAST TECHNIQUE: Multiplanar, multiecho pulse sequences of the brain and surrounding structures were obtained without intravenous contrast. COMPARISON:  Same-day CT/CTA head and neck FINDINGS: Brain: There is no evidence of acute intracranial hemorrhage, extra-axial fluid collection, or acute infarct Parenchymal volume is normal. The ventricles are normal in size. Parenchymal signal is normal. The pituitary and suprasellar region are normal. There is no mass lesion. There is no mass effect or midline shift. Vascular:  Normal flow voids. Skull and upper cervical spine: Normal marrow signal. Sinuses/Orbits: The paranasal sinuses are clear. The globes and orbits are unremarkable. Other: The mastoid air cells and middle ear cavities are clear. IMPRESSION: Normal noncontrast brain MRI. Electronically Signed   By: Lesia Hausen M.D.   On: 12/01/2022 12:57   CT Angio Chest/Abd/Pel for Dissection W and/or Wo Contrast  Result Date: 12/01/2022 CLINICAL DATA:  Left-sided weakness, chest pain. EXAM: CT ANGIOGRAPHY CHEST, ABDOMEN AND PELVIS TECHNIQUE: Non-contrast CT of the chest was initially obtained. Multidetector CT imaging through the chest, abdomen and pelvis was performed using the standard protocol during bolus administration of intravenous contrast. Multiplanar reconstructed images and MIPs were obtained and reviewed to evaluate the vascular anatomy. RADIATION DOSE REDUCTION: This exam was performed according to the departmental dose-optimization program which includes automated exposure control, adjustment of the mA and/or kV according to patient size and/or use of iterative reconstruction technique. CONTRAST:  OMNIPAQUE IOHEXOL 350 MG/ML SOLN COMPARISON:  None Available. FINDINGS: CTA CHEST FINDINGS Cardiovascular: There is no evidence of acute intramural hematoma on the initial  noncontrast chest CT. There is no evidence of dissection or aneurysm on the postcontrast CTA. The heart size is normal. There is no pericardial effusion. There is no pulmonary embolism. Mediastinum/Nodes: The imaged thyroid is unremarkable. The esophagus is grossly unremarkable. There is no mediastinal, hilar, or axillary lymphadenopathy. Lungs/Pleura: The trachea and central airways are patent. The lungs are clear, with no focal consolidation or pulmonary edema. There is no pleural effusion or pneumothorax. There are no suspicious nodules. Musculoskeletal: There is no acute osseous abnormality or suspicious osseous lesion. Review of the MIP images confirms the above findings. CTA ABDOMEN AND PELVIS FINDINGS VASCULAR Aorta: Normal caliber aorta without aneurysm, dissection, vasculitis or significant stenosis. Celiac: Patent without evidence of aneurysm, dissection, vasculitis or significant stenosis. SMA: Patent without evidence of aneurysm, dissection, vasculitis or significant stenosis. Renals: Both renal arteries are patent without evidence of aneurysm, dissection, vasculitis, fibromuscular dysplasia or significant stenosis. IMA: Patent without evidence of aneurysm, dissection, vasculitis or significant stenosis. Inflow: Patent without evidence of aneurysm, dissection, vasculitis or significant stenosis. Veins: No obvious venous abnormality within the limitations of this arterial phase study. Review of the MIP images confirms the above findings. NON-VASCULAR Hepatobiliary: The liver and gallbladder are unremarkable. There is no biliary ductal dilatation. Pancreas: Unremarkable. Spleen: Unremarkable. Adrenals/Urinary Tract: The adrenals are unremarkable. The kidneys are unremarkable, with no focal lesion, stone, hydronephrosis, or hydroureter. The bladder is unremarkable. Stomach/Bowel: The stomach is unremarkable. There is no evidence of bowel obstruction there is no definite abnormal bowel wall thickening or  inflammatory change, though evaluation is limited by paucity of abdominal fat. The appendix is not definitively identified. Lymphatic: There is no abdominal or pelvic lymphadenopathy. Reproductive: The uterus and adnexa are unremarkable. There is a probable nabothian cyst on the right measuring up to 8 mm. Other: There is no ascites or free intraperitoneal air. Musculoskeletal: There is no acute osseous abnormality or suspicious osseous lesion. Review of the MIP images confirms the above findings. IMPRESSION: No evidence of acute aortic syndrome or other acute pathology in the chest, abdomen, or pelvis. Electronically Signed   By: Lesia Hausen M.D.   On: 12/01/2022 12:29   CT ANGIO HEAD NECK W WO CM (CODE STROKE)  Result Date: 12/01/2022 CLINICAL DATA:  Left-sided weakness. EXAM: CT ANGIOGRAPHY HEAD AND NECK WITH AND WITHOUT CONTRAST TECHNIQUE: Multidetector CT imaging of the head and neck was performed using the  standard protocol during bolus administration of intravenous contrast. Multiplanar CT image reconstructions and MIPs were obtained to evaluate the vascular anatomy. Carotid stenosis measurements (when applicable) are obtained utilizing NASCET criteria, using the distal internal carotid diameter as the denominator. RADIATION DOSE REDUCTION: This exam was performed according to the departmental dose-optimization program which includes automated exposure control, adjustment of the mA and/or kV according to patient size and/or use of iterative reconstruction technique. CONTRAST:  100 cc Omnipaque 350 COMPARISON:  None Available. FINDINGS: CTA NECK FINDINGS Aortic arch: The imaged aortic arch is normal. The origins of the major branch vessels are patent the subclavian arteries are patent to the level imaged. Right carotid system: The right common, internal, and external carotid arteries are patent, without hemodynamically significant stenosis or occlusion there is no evidence of dissection or aneurysm. Left  carotid system: The left common, internal, and external carotid arteries are patent, without hemodynamically significant stenosis or occlusion there is no evidence of dissection or aneurysm. Vertebral arteries: The vertebral arteries are patent, without hemodynamically significant stenosis or occlusion. There is no evidence of dissection or aneurysm. Skeleton: There is no acute osseous abnormality or suspicious osseous lesion. Other neck: The soft tissues of the neck are unremarkable. Upper chest: Reported separately. Review of the MIP images confirms the above findings CTA HEAD FINDINGS Anterior circulation: The intracranial ICAs are normal. The bilateral MCAs are normal. The bilateral ACAS are normal. The anterior communicating artery is normal. There is no aneurysm or AVM. Posterior circulation: The bilateral V4 segments are normal. The basilar artery is normal. PICA is dominant on the right. The left PICA origin is not definitely seen. The other major cerebellar arteries appear normal. The bilateral PCAs are normal. Right larger than left posterior communicating arteries are identified. There is no aneurysm or AVM. Venous sinuses: Patent. Anatomic variants: None. Review of the MIP images confirms the above findings IMPRESSION: Normal CTA of the head and neck. Findings discussed with Dr Derry Lory at 12:17 pm. Electronically Signed   By: Lesia Hausen M.D.   On: 12/01/2022 12:19   CT HEAD CODE STROKE WO CONTRAST  Result Date: 12/01/2022 CLINICAL DATA:  Code stroke.  Left-sided weakness EXAM: CT HEAD WITHOUT CONTRAST TECHNIQUE: Contiguous axial images were obtained from the base of the skull through the vertex without intravenous contrast. RADIATION DOSE REDUCTION: This exam was performed according to the departmental dose-optimization program which includes automated exposure control, adjustment of the mA and/or kV according to patient size and/or use of iterative reconstruction technique. COMPARISON:  None  Available. FINDINGS: Brain: There is no acute intracranial hemorrhage, extra-axial fluid collection, or acute infarct Parenchymal volume is normal. The ventricles are normal in size. Gray-white differentiation is preserved The pituitary and suprasellar region are normal. There is no mass lesion. There is no mass effect or midline shift. Vascular: No hyperdense vessel or unexpected calcification. Skull: Normal. Negative for fracture or focal lesion. Sinuses/Orbits: The paranasal sinuses are clear. The globes and orbits are unremarkable. Other: The mastoid air cells and middle ear cavities are clear. ASPECTS [facility] Stroke Program Early CT Score) - Ganglionic level infarction (caudate, lentiform nuclei, internal capsule, insula, M1-M3 cortex): 7 - Supraganglionic infarction (M4-M6 cortex): 3 Total score (0-10 with 10 being normal): 10 IMPRESSION: Normal noncontrast head CT. Findings discussed with Dr Derry Lory at 11:52 am. Electronically Signed   By: Lesia Hausen M.D.   On: 12/01/2022 12:04    Procedures Procedures    Medications Ordered in ED Medications  sodium chloride  flush (NS) 0.9 % injection 3 mL (3 mLs Intravenous Not Given 12/01/22 1311)  acetaminophen (TYLENOL) tablet 1,000 mg (has no administration in time range)  ketorolac (TORADOL) 15 MG/ML injection 15 mg (has no administration in time range)  iohexol (OMNIPAQUE) 350 MG/ML injection 100 mL (100 mLs Intravenous Contrast Given 12/01/22 1211)    ED Course/ Medical Decision Making/ A&P                          Medical Decision Making Amount and/or Complexity of Data Reviewed Labs: ordered. Radiology: ordered. Decision-making details documented in ED Course.  Risk OTC drugs. Prescription drug management.    This patient presents to the ED for concern of CP, L-sided weakness, syncope, this involves an extensive number of treatment options, and is a complaint that carries with it a high risk of complications and morbidity.  I  considered the following differential and admission for this acute, potentially life threatening condition.   MDM:    Given the acute onset of neurological symptoms, stroke is the most concerning etiology of these acute symptoms. The neuro exam is significant for strength and sensation in the left upper extremity and left lower extremity.  In the setting of chest presyncope, shortness of breath is concerning also aortic dissection for rule out dissection or giving TNK.  Neurology evaluated at bedside and plan to move forward with CTA head and neck, CT head, CT dissection protocol.  Radiology stated the patient symptoms include electrolyte derangements, metabolic derangements, EtOH or drug use, hypo-/hyperglycemia, seizure-like activity with Todd's paralysis, complex migraine, ACS/arrhythmia, anemia, functional symptoms.  EKG without any signs of ischemia.  No signs or symptoms of DVT, no history of DVT or PE, no tachycardia, tachypnea or hypoxia, lower concern for PE.  No fevers or chills to suggest pneumonia.  After CTAs which resulted normal, patient symptoms are noted to have improved drastically resolved that into stat MRI which was negative for acute stroke.  Resting EEG did not show any seizure-like activity either per neurology.  Labs demonstrate very mild hyperglycemia 67 and patient takes p.o. after this.  Other workup notable for AST 42, total bilirubin 1.5, otherwise electrolytes are unremarkable.  Hemoglobin 9.7 which is close to baseline.  Patient overall is very well in the room but she still states she has chest pain.  She is still pending a troponin and give analgesia and reevaluate.  Patient is signed out to the oncoming physician Dr. Renaye Rakers who is informed of her history, presentation, workup, and plan.  The patient's troponins are negative she can likely be discharged with outpatient follow-up and lab recheck.   Clinical Course as of 12/01/22 1420  Mon Dec 01, 2022  1215 CTH NAICP [HN]   1235 Normal CTA H&N, CT dissection protocol. [HN]  1240 Pt not in room, pateitn in MRI [HN]  1306 MR BRAIN WO CONTRAST Normal [HN]  1417 Pt reevaluated. Her L arm weakness is resolved but her L sided chest pain is still present. Rated 8/10, worse with inspiration.  [HN]    Clinical Course User Index [HN] Loetta Rough, MD    Labs: I Ordered, and personally interpreted labs.  The pertinent results include:  those listed above  Imaging Studies ordered: Imaging studies including CTH, CTA H&N, CT Dissection protocol were ordered I independently visualized and interpreted imaging. I agree with the radiologist interpretation  Additional history obtained from chart review.    Cardiac Monitoring: The patient  was maintained on a cardiac monitor.  I personally viewed and interpreted the cardiac monitored which showed an underlying rhythm of: NSR  Reevaluation: After the interventions noted above, I reevaluated the patient and found that they have :improved  Social Determinants of Health: Lives independently  Disposition:  Signed out  Co morbidities that complicate the patient evaluation  Past Medical History:  Diagnosis Date   Anemia    Anxiety    Chronic back pain    Depression    PCOS (polycystic ovarian syndrome)      Medicines Meds ordered this encounter  Medications   sodium chloride flush (NS) 0.9 % injection 3 mL   DISCONTD: fentaNYL (SUBLIMAZE) injection 25 mcg   iohexol (OMNIPAQUE) 350 MG/ML injection 100 mL   DISCONTD: fentaNYL (SUBLIMAZE) 50 MCG/ML injection    Oriet, Jonathan: cabinet override   acetaminophen (TYLENOL) tablet 1,000 mg   ketorolac (TORADOL) 15 MG/ML injection 15 mg    I have reviewed the patients home medicines and have made adjustments as needed  Problem List / ED Course: Problem List Items Addressed This Visit   None Visit Diagnoses     Chest pain, unspecified type    -  Primary   Left-sided weakness                        This note was created using dictation software, which may contain spelling or grammatical errors.    Loetta Rough, MD 12/04/22 [phone]

## 2022-12-01 NOTE — Code Documentation (Signed)
Vala Raffo is a 32 yr old female with a PMH of anemia presenting to [facility] on 12/01/2022. 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 Pt was last known well at 1030, after which time she had a syncopal episode. After that, she has been weak on the left, and has left sensory deficit. She is not on any blood thinner.    Pt met at bridge by stroke team. Airway cleared by EDP. Labs and CBG obtained. Pt to CT with team. NIHSS 4. Pt with drowsiness, left weakness and sensory loss. Pt also states she has chest pain. The following imaging was obtained: CT, CTA head, CTA CAP. Per neurologist, CT neg for acute hemorrhage. Angiographic images pending. Pt taken to STAT MRI.  Per neurologist, MRI diffusion negative for stroke.     Pt back to ED room 25 where her w/u will continue. Pt not candidate for TNK or IR as MRI negative for stroke. Handoff with Henriette Combs.

## 2022-12-01 NOTE — ED Notes (Signed)
Alert and oriented.  Visitor at bedside

## 2022-12-01 NOTE — Progress Notes (Signed)
EEG complete - results pending 

## 2022-12-01 NOTE — Progress Notes (Signed)
25 mcg fentanyl given at 1214. Scanner in MRI broken

## 2022-12-01 NOTE — Procedures (Signed)
Patient Name: VANISSA STRENGTH  MRN: [id number]  Epilepsy Attending: Charlsie Quest  Referring Physician/Provider: Marjorie Smolder, NP  Date: 12/01/2022 Duration: 21.43 mins  Patient history: 31yo F with syncope. EEG to evaluate for seizure  Level of alertness: Awake  AEDs during EEG study: None  Technical aspects: This EEG study was done with scalp electrodes positioned according to the 10-20 International system of electrode placement. Electrical activity was reviewed with band pass filter of 1-70Hz , sensitivity of 7 uV/mm, display speed of 41mm/sec with a 60Hz  notched filter applied as appropriate. EEG data were recorded continuously and digitally stored.  Video monitoring was available and reviewed as appropriate.  Description: The posterior dominant rhythm consists of 10 Hz activity of moderate voltage (25-35 uV) seen predominantly in posterior head regions, symmetric and reactive to eye opening and eye closing. Physiologic photic driving was seen during photic stimulation.  Hyperventilation was not performed.     IMPRESSION: This study is within normal limits. No seizures or epileptiform discharges were seen throughout the recording.  A normal interictal EEG does not exclude the diagnosis of epilepsy.  Taraya Steward Annabelle Harman

## 2022-12-01 NOTE — ED Triage Notes (Signed)
Pt arrives from work via [employer] pt states that when she walks out side she can not catch her breath, pt reports passing oput but denies hitting her head or falling. Pt reports she can not feel her left arm pt reports not feeling well x 3 days. Pt reporting chest pain.

## 2022-12-01 NOTE — Consult Note (Signed)
Neurology Consultation  Reason for Consult: Left arm weakness Referring Physician: Dr. Jearld Fenton  CC: Left arm weakness  History is obtained from: Patient and chart  HPI: LENORE MOYANO is a 32 y.o. female with history of iron deficiency anemia, C-section and PCOS who presents with left arm weakness.  Patient states she has had epigastric pain and shortness of breath for about 3 days.  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  She began having difficulty catching her breath and had a syncopal event.  She did not fall and did not hit her head.  She then was unable to move her left arm and had diminished sensation on the left side.   LKW: 1030 TNK given?: no, MRI negative for stroke IR Thrombectomy? No, MRI negative for stroke Modified Rankin Scale: 0-Completely asymptomatic and back to baseline post- stroke  ROS: A complete ROS was performed and is negative except as noted in the HPI.   Past Medical History:  Diagnosis Date   Anemia    Anxiety    Chronic back pain    Depression    PCOS (polycystic ovarian syndrome)      Family History  Problem Relation Age of Onset   Kidney disease Maternal Aunt    Breast cancer Maternal Aunt    Hypertension Maternal Grandmother    Hyperlipidemia Maternal Grandmother    Arthritis Maternal Grandmother    Diabetes Maternal Grandfather    Hypertension Maternal Grandfather      Social History:   reports that she quit smoking about 2 years ago. Her smoking use included cigarettes. She has never used smokeless tobacco. She reports that she does not currently use alcohol. She reports that she does not use drugs.  Medications  Current Facility-Administered Medications:    sodium chloride flush (NS) 0.9 % injection 3 mL, 3 mL, Intravenous, Once, Loetta Rough, MD  Current Outpatient Medications:    diphenhydrAMINE (BENADRYL) 25 MG tablet, Take 25 mg by mouth daily as needed for  allergies., Disp: , Rfl:    docusate sodium (COLACE) 100 MG capsule, Take 100 mg by mouth daily as needed for mild constipation., Disp: , Rfl:    ferrous sulfate 325 (65 FE) MG tablet, Take 1 tablet (325 mg total) by mouth 2 (two) times daily with a meal., Disp: 60 tablet, Rfl: 3   folic acid (FOLVITE) 1 MG tablet, Take 1 mg by mouth daily., Disp: , Rfl:    ibuprofen (ADVIL) 600 MG tablet, Take 1 tablet (600 mg total) by mouth every 6 (six) hours as needed for mild pain, moderate pain or cramping., Disp: 30 tablet, Rfl: 1   oxyCODONE (OXY IR/ROXICODONE) 5 MG immediate release tablet, Take 1-2 tablets (5-10 mg total) by mouth every 6 (six) hours as needed for moderate pain, severe pain or breakthrough pain., Disp: 30 tablet, Rfl: 0   Prenatal Vit-Fe Fumarate-FA (PRENATAL MULTIVITAMIN) TABS tablet, Take 1 tablet by mouth daily., Disp: , Rfl:    Exam: Current vital signs: Wt 56 kg   BMI 20.54 kg/m  Vital signs in last 24 hours: Weight:  [56 kg] 56 kg (07/15 1100)  GENERAL: Awake, alert, in no acute distress Psych: Affect appropriate for situation, patient is calm and cooperative with examination Head: Normocephalic and atraumatic, without obvious abnormality EENT: Normal conjunctivae, moist mucous membranes, no OP obstruction LUNGS: Normal respiratory effort. Non-labored breathing on room air CV: Regular rate and rhythm  on telemetry ABDOMEN: Soft, non-tender, non-distended Extremities: warm, well perfused, without obvious deformity  NEURO:  Mental Status: Awake, alert, and oriented to person, place, time, and situation. She is able to provide some history of present illness. Speech/Language: speech is quiet but clear and fluent.   Naming, repetition, fluency, and comprehension intact without aphasia  No neglect is noted Cranial Nerves:  II: PERRL visual fields full.  III, IV, VI: EOMI. Lid elevation symmetric and full.  V: Sensation is intact to light touch and symmetrical to face.   VII: Face is symmetric resting and smiling.  VIII: Hearing intact to voice IX, X: phonation normal.  XI: Normal sternocleidomastoid and trapezius muscle strength XII: Tongue protrudes midline without fasciculations.   Motor: 5/5 strength to right upper and right lower extremities, 2/5 strength to left upper extremity, 4+/5 strength to left lower extremity Tone is normal. Bulk is normal.  Sensation: Intact to light touch bilaterally in all four extremities diminished on the left. No extinction to DSS present.  Coordination: FTN intact bilaterally. HKS intact bilaterally. No pronator drift. Alternating hand movements.  DTRs: 2+ throughout.  Gait: Deferred  NIHSS: 1a Level of Conscious.:1 1b LOC Questions: 0 1c LOC Commands: 0 2 Best Gaze: 0 3 Visual: 0 4 Facial Palsy: 0 5a Motor Arm - left: 2 5b Motor Arm - Right: 0 6a Motor Leg - Left: 0 6b Motor Leg - Right: 0 7 Limb Ataxia: 0 8 Sensory: 1 9 Best Language: 0 10 Dysarthria: 0 11 Extinct. and Inatten.: 0 TOTAL: 4   Labs I have reviewed labs in epic and the results pertinent to this consultation are:   CBC    Component Value Date/Time   WBC 5.2 01/29/2022 1131   WBC 11.9 (H) 10/26/2021 0446   RBC 4.38 01/29/2022 1131   HGB 10.2 (L) 12/01/2022 1146   HGB 12.1 01/29/2022 1131   HGB 10.7 10/13/2017 0000   HCT 30.0 (L) 12/01/2022 1146   PLT 192 01/29/2022 1131   MCV 81.5 01/29/2022 1131   MCH 27.6 01/29/2022 1131   MCHC 33.9 01/29/2022 1131   RDW 13.0 01/29/2022 1131   LYMPHSABS 1.5 01/29/2022 1131   MONOABS 0.4 01/29/2022 1131   EOSABS 0.1 01/29/2022 1131   BASOSABS 0.0 01/29/2022 1131    CMP     Component Value Date/Time   NA 140 12/01/2022 1146   K 4.9 12/01/2022 1146   CL 111 12/01/2022 1146   CO2 30 01/29/2022 1131   GLUCOSE 70 12/01/2022 1146   BUN 9 12/01/2022 1146   CREATININE 0.50 12/01/2022 1146   CREATININE 0.83 01/29/2022 1131   CREATININE 0.94 01/04/2016 1628   CALCIUM 9.6 01/29/2022 1131    PROT 7.3 01/29/2022 1131   ALBUMIN 4.6 01/29/2022 1131   AST 14 (L) 01/29/2022 1131   ALT 10 01/29/2022 1131   ALKPHOS 51 01/29/2022 1131   BILITOT 0.5 01/29/2022 1131   GFRNONAA >60 01/29/2022 1131    Lipid Panel  No results found for: "CHOL", "TRIG", "HDL", "CHOLHDL", "VLDL", "LDLCALC", "LDLDIRECT"   Imaging I have reviewed the images obtained:  CT-scan of the brain  MRI examination of the brain  Assessment: 32 year old patient with history of iron deficiency anemia, C-section and PCOS presents after a 3-day history of epigastric pain and shortness of breath and syncopal episode today with left arm weakness and left-sided sensory deficit.  Patient was loading boxes into her truck for work when when she had a syncopal episode, did not hit her  head but noticed when she came around that she was unable to move her left arm and had left-sided sensory deficit.  She is within the window for TNK, but given epigastric pain, CT angio of chest abdomen and pelvis was performed to rule out aortic dissection.  This was negative for obvious dissection, and after the scans, patient's symptoms were noted to improve.  She was taken to stat MRI, and DWI was negative for acute stroke.  Symptoms are likely functional or post syncopal in nature.  As syncope was unwitnessed, will obtain resting EEG to rule out seizure activity.  Impression: Functional symptoms of left-sided arm weakness versus post syncopal symptoms in setting of increased heat outdoors  Recommendations: -Routine EEG -Workup of epigastric pain per primary team -Neurology will follow as needed, please call with questions or concerns  Pt seen by NP/Neuro and later by MD. Note/plan to be edited by MD as needed.  Cortney E Ernestina Columbia , MSN, AGACNP-BC [facility] See Amion for schedule and pager information 12/01/2022 11:53 AM   NEUROHOSPITALIST ADDENDUM Performed a face to face diagnostic evaluation.   I have reviewed the  contents of history and physical exam as documented by PA/ARNP/Resident and agree with above documentation.  I have discussed and formulated the above plan as documented. Edits to the note have been made as needed.  Impression:  Key exam findings: Plan:  Erick Blinks, MD [facility] [phone]   If 7pm to 7am, please call on call as listed on AMION.  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 Was hyperventilating when EMS got there and was weak in LUE with numbness. Here, some concern for potential fucntional weakness althou hard to be sure. Also reporting significant tearing mid sternal chest pain and therefor in addition to [facility], CTA H + N, we obtained CT dissectino study. Symptoms notable improved after CT but still mild LUE weakness. We got STAT MRI Brain which was negative for any acute stroke. Overall, symptoms most likely are functional vs possibly seizure and post ictal todds.  No prior hx of seizures, denies any headaches.  Agree with workup as above. We will be available as needed and will not be actively following this patient.

## 2022-12-02 ENCOUNTER — Encounter: Payer: Self-pay | Admitting: Neurology

## 2022-12-16 ENCOUNTER — Ambulatory Visit (INDEPENDENT_AMBULATORY_CARE_PROVIDER_SITE_OTHER): Payer: 59 | Admitting: Adult Health

## 2022-12-16 ENCOUNTER — Encounter: Payer: Self-pay | Admitting: Adult Health

## 2022-12-16 VITALS — BP 110/80 | HR 84 | Temp 98.2°F | Ht 65.0 in | Wt 118.0 lb

## 2022-12-16 DIAGNOSIS — M25512 Pain in left shoulder: Secondary | ICD-10-CM | POA: Diagnosis not present

## 2022-12-16 DIAGNOSIS — R0789 Other chest pain: Secondary | ICD-10-CM

## 2022-12-16 MED ORDER — METHYLPREDNISOLONE 4 MG PO TBPK: ORAL_TABLET | ORAL | 0 refills | Status: DC

## 2022-12-16 MED ORDER — CYCLOBENZAPRINE HCL 10 MG PO TABS
10.0000 mg | ORAL_TABLET | Freq: Every day | ORAL | 0 refills | Status: DC
Start: 2022-12-16 — End: 2024-02-02

## 2022-12-16 NOTE — Progress Notes (Signed)
Subjective:    Patient ID: Nancy Hunter, female    DOB: May 14, 1991, 32 y.o.   MRN: [id number]  HPI 32 year old female who  has a past medical history of Anemia, Anxiety, Chronic back pain, Depression, and PCOS (polycystic ovarian syndrome).  She presents to the office today for follow-up after being seen in the emergency room 15 days ago.  She presented with left-sided chest and arm pain and weakness x 2 days.  The ER her CTA dissection study was negative, MRI acute stroke, and stat EEG showed no acute findings.  There was question of whether her symptoms were complex migraine given blurred vision earlier that day.   Upon discharge she continued to have some mild pleuritic left-sided chest pain, due to negative CT angiogram did not feel as though there is any concern for large central PE.  She was advised to attempt NSAIDs at home.   Today she reports that since being discharged from the hospital she is feeling better, continued to have chest pain and felt somewhat disoriented for a few days after leaving the emergency room but as of today month of her symptoms have resolved.  She does like the left shoulder pain is starting to come back.  This is felt as an ache and tightness.  Does not inhibit range of motion. She has  not used any OTC medication since being discharged.   Review of Systems See HPI   Past Medical History:  Diagnosis Date   Anemia    Anxiety    Chronic back pain    Depression    PCOS (polycystic ovarian syndrome)     Social History   Socioeconomic History   Marital status: Significant Other    Spouse name: Not on file   Number of children: Not on file   Years of education: Not on file   Highest education level: Not on file  Occupational History   Not on file  Tobacco Use   Smoking status: Former    Current packs/day: 0.00    Types: Cigarettes    Quit date: 10/24/2020    Years since quitting: 2.1   Smokeless tobacco: Never  Vaping Use   Vaping status:  Former   Quit date: 10/24/2020  Substance and Sexual Activity   Alcohol use: Not Currently    Comment: not while preg   Drug use: No   Sexual activity: Yes    Birth control/protection: None  Other Topics Concern   Not on file  Social History Narrative   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, community   Social Determinants of Corporate investment banker Strain: Not on file  Food Insecurity: Not on file  Transportation Needs: Not on file  Physical Activity: Not on file  Stress: Not on file  Social Connections: Not on file  Intimate Partner Violence: Not on file    Past Surgical History:  Procedure Laterality Date   CESAREAN SECTION MULTI-GESTATIONAL N/A 10/25/2021   Procedure: CESAREAN SECTION MULTI-GESTATIONAL;  Surgeon: [name], DO;  Location: [facility] LD [facility];  Service: Obstetrics;  Laterality: N/A;  Request OB Fellow   COLPOSCOPY     ECTOPIC PREGNANCY SURGERY  04/09/2016   WISDOM TOOTH EXTRACTION      Family History  Problem Relation Age of Onset   Kidney disease Maternal Aunt    Breast cancer Maternal Aunt    Hypertension Maternal Grandmother    Hyperlipidemia Maternal Grandmother    Arthritis Maternal Grandmother  Diabetes Maternal Grandfather    Hypertension Maternal Grandfather     Allergies  Allergen Reactions   Latex Hives    Current Outpatient Medications on File Prior to Visit  Medication Sig Dispense Refill   diphenhydrAMINE (BENADRYL) 25 MG tablet Take 25 mg by mouth daily as needed for allergies.     docusate sodium (COLACE) 100 MG capsule Take 100 mg by mouth daily as needed for mild constipation.     ferrous sulfate 325 (65 FE) MG tablet Take 1 tablet (325 mg total) by mouth 2 (two) times daily with a meal. 60 tablet 3   folic acid (FOLVITE) 1 MG tablet Take 1 mg by mouth daily.     ibuprofen (ADVIL) 600 MG tablet Take 1 tablet (600 mg total) by mouth every 6 (six) hours as needed for mild pain, moderate pain or cramping. 30 tablet 1   oxyCODONE (OXY  IR/ROXICODONE) 5 MG immediate release tablet Take 1-2 tablets (5-10 mg total) by mouth every 6 (six) hours as needed for moderate pain, severe pain or breakthrough pain. 30 tablet 0   Prenatal Vit-Fe Fumarate-FA (PRENATAL MULTIVITAMIN) TABS tablet Take 1 tablet by mouth daily.     No current facility-administered medications on file prior to visit.    LMP  (LMP Unknown)       Objective:   Physical Exam Vitals and nursing note reviewed.  Constitutional:      Appearance: Normal appearance.  Cardiovascular:     Rate and Rhythm: Normal rate and regular rhythm.     Pulses: Normal pulses.     Heart sounds: Normal heart sounds.  Musculoskeletal:        General: Tenderness present. No swelling. Normal range of motion.     Left shoulder: Tenderness present. No bony tenderness. Normal range of motion. Normal strength. Normal pulse.       Arms:  Skin:    General: Skin is warm and dry.  Neurological:     General: No focal deficit present.     Mental Status: She is oriented to person, place, and time.  Psychiatric:        Mood and Affect: Mood normal.        Behavior: Behavior normal.        Thought Content: Thought content normal.        Judgment: Judgment normal.           Assessment & Plan:  1. Acute pain of left shoulder - Will send in Flexeril and medrol dose pack  - Advised warm compress and stretching exercises - cyclobenzaprine (FLEXERIL) 10 MG tablet; Take 1 tablet (10 mg total) by mouth at bedtime.  Dispense: 15 tablet; Refill: 0 - methylPREDNISolone (MEDROL DOSEPAK) 4 MG TBPK tablet; Take as directed  Dispense: 21 tablet; Refill: 0  2. Atypical chest pain - Has resolved. Possibly muscular related or costochondritis  - Reviewed ER notes, imaging, and labs with patient.   Shirline Frees, NP   Time spent with patient today was 30 minutes which consisted of chart review, discussing atypical chest pain and left shoulder pain,  work up, treatment answering questions and  documentation.

## 2022-12-25 ENCOUNTER — Ambulatory Visit: Payer: 59 | Admitting: Adult Health

## 2022-12-25 NOTE — Progress Notes (Deleted)
Patient presents to clinic today to reestablish care. She is a 32 year old female who  has a past medical history of Anemia, Anxiety, Chronic back pain, Depression, and PCOS (polycystic ovarian syndrome).   Acute Concerns: Reestablish Care  Chronic Issues: PCOS - was on Metformin at one point in time   History of iron deficiency - was seen by Hematology, last being in September 2023 . She does take oral iron supplements. During her last visit labs showed correction in hemoglobin and iron levels. She has a follow up with Hematology in September       Health Maintenance: Dental -- Vision -- Immunizations -- Mammogram -- PAP --    Past Medical History:  Diagnosis Date   Anemia    Anxiety    Chronic back pain    Depression    PCOS (polycystic ovarian syndrome)     Past Surgical History:  Procedure Laterality Date   CESAREAN SECTION MULTI-GESTATIONAL N/A 10/25/2021   Procedure: CESAREAN SECTION MULTI-GESTATIONAL;  Surgeon: [name], DO;  Location: [facility] LD [facility]S;  Service: Obstetrics;  Laterality: N/A;  Request OB Fellow   COLPOSCOPY     ECTOPIC PREGNANCY SURGERY  04/09/2016   WISDOM TOOTH EXTRACTION      Current Outpatient Medications on File Prior to Visit  Medication Sig Dispense Refill   cyclobenzaprine (FLEXERIL) 10 MG tablet Take 1 tablet (10 mg total) by mouth at bedtime. 15 tablet 0   diphenhydrAMINE (BENADRYL) 25 MG tablet Take 25 mg by mouth daily as needed for allergies.     docusate sodium (COLACE) 100 MG capsule Take 100 mg by mouth daily as needed for mild constipation.     ferrous sulfate 325 (65 FE) MG tablet Take 1 tablet (325 mg total) by mouth 2 (two) times daily with a meal. 60 tablet 3   folic acid (FOLVITE) 1 MG tablet Take 1 mg by mouth daily.     ibuprofen (ADVIL) 600 MG tablet Take 1 tablet (600 mg total) by mouth every 6 (six) hours as needed for mild pain, moderate pain or cramping. 30 tablet 1   methylPREDNISolone (MEDROL DOSEPAK) 4 MG  TBPK tablet Take as directed 21 tablet 0   oxyCODONE (OXY IR/ROXICODONE) 5 MG immediate release tablet Take 1-2 tablets (5-10 mg total) by mouth every 6 (six) hours as needed for moderate pain, severe pain or breakthrough pain. 30 tablet 0   Prenatal Vit-Fe Fumarate-FA (PRENATAL MULTIVITAMIN) TABS tablet Take 1 tablet by mouth daily.     No current facility-administered medications on file prior to visit.    Allergies  Allergen Reactions   Latex Hives    Family History  Problem Relation Age of Onset   Kidney disease Maternal Aunt    Breast cancer Maternal Aunt    Hypertension Maternal Grandmother    Hyperlipidemia Maternal Grandmother    Arthritis Maternal Grandmother    Diabetes Maternal Grandfather    Hypertension Maternal Grandfather     Social History   Socioeconomic History   Marital status: Significant Other    Spouse name: Not on file   Number of children: Not on file   Years of education: Not on file   Highest education level: Not on file  Occupational History   Not on file  Tobacco Use   Smoking status: Former    Current packs/day: 0.00    Types: Cigarettes    Quit date: 10/24/2020    Years since quitting: 2.1   Smokeless tobacco: Never  Vaping Use   Vaping status: Former   Quit date: 10/24/2020  Substance and Sexual Activity   Alcohol use: Not Currently    Comment: not while preg   Drug use: No   Sexual activity: Yes    Birth control/protection: None  Other Topics Concern   Not on file  Social History Narrative   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  For medical purposes only. Performed at [facility], [address] [place], [place] [address]   CBG monitoring, ED     Status: None   Collection Time: 12/01/22 11:41 AM  Result Value Ref Range   Glucose-Capillary 89 70 - 99 mg/dL    Comment: Glucose reference range applies only to samples taken after fasting for at least 8 hours.   Comment 1 Notify RN    Comment 2 Document in Chart   I-stat chem 8, ED     Status: Abnormal   Collection Time: 12/01/22 11:46 AM  Result Value Ref Range   Sodium 140 135 - 145 mmol/L   Potassium 4.9 3.5 - 5.1 mmol/L   Chloride 111 98 - 111 mmol/L   BUN 9 6 - 20 mg/dL   Creatinine, Ser 6.04 0.44 - 1.00 mg/dL   Glucose, Bld 70 70 - 99 mg/dL    Comment: Glucose reference range applies only to samples taken after fasting for at least 8 hours.   Calcium, Ion 0.91 (L) 1.15 - 1.40 mmol/L   TCO2 20 (L) 22 - 32 mmol/L   Hemoglobin 10.2 (L) 12.0 - 15.0 g/dL   HCT 54.0 (L) 98.1 - 19.1 %  CBC     Status: Abnormal   Collection Time: 12/01/22 11:51 AM  Result Value Ref Range   WBC 5.4 4.0 - 10.5 K/uL   RBC 3.58 (L) 3.87 - 5.11 MIL/uL   Hemoglobin 9.7 (L) 12.0 - 15.0 g/dL   HCT 47.8 (L) 29.5 - 62.1 %   MCV 82.4 80.0 - 100.0 fL   MCH 27.1 26.0 - 34.0 pg   MCHC 32.9 30.0 - 36.0 g/dL   RDW 30.8 65.7 - 84.6 %   Platelets 147 (L) 150 - 400 K/uL   nRBC 0.4 (H) 0.0 - 0.2 %    Comment: Performed at [facility], [address], [place], [place] [address]  Differential     Status: None   Collection Time: 12/01/22 11:51 AM  Result Value Ref Range   Neutrophils Relative % 71 %   Neutro Abs 3.8 1.7 - 7.7 K/uL   Lymphocytes Relative 20 %   Lymphs Abs 1.1 0.7 - 4.0 K/uL   Monocytes Relative 8 %   Monocytes Absolute 0.4 0.1 - 1.0 K/uL   Eosinophils Relative 0 %   Eosinophils Absolute 0.0 0.0 - 0.5  K/uL  Basophils Relative 0 %   Basophils Absolute 0.0 0.0 - 0.1 K/uL   Immature Granulocytes 1 %   Abs Immature Granulocytes 0.06 0.00 - 0.07 K/uL    Comment: Performed at [facility], [address] [place], [place] [address]  Comprehensive metabolic panel     Status: Abnormal   Collection Time: 12/01/22 11:51 AM  Result Value Ref Range   Sodium 137 135 - 145 mmol/L   Potassium 5.0 3.5 - 5.1 mmol/L    Comment: HEMOLYSIS AT THIS LEVEL MAY AFFECT RESULT   Chloride 110 98 - 111 mmol/L   CO2 15 (L) 22 - 32 mmol/L   Glucose, Bld 67 (L) 70 - 99 mg/dL    Comment: Glucose reference range applies only to samples taken after fasting for at least 8 hours.   BUN 9 6 - 20 mg/dL   Creatinine, Ser 1.91 0.44 - 1.00 mg/dL   Calcium 7.4 (L) 8.9 - 10.3 mg/dL   Total Protein 5.3 (L) 6.5 - 8.1 g/dL   Albumin 3.2 (L) 3.5 - 5.0 g/dL   AST 42 (H) 15 - 41 U/L    Comment: HEMOLYSIS AT THIS LEVEL MAY AFFECT RESULT   ALT 24 0 - 44 U/L    Comment: HEMOLYSIS AT THIS LEVEL MAY AFFECT RESULT   Alkaline Phosphatase 35 (L) 38 - 126 U/L    Comment: HEMOLYSIS AT THIS LEVEL MAY AFFECT RESULT   Total Bilirubin 1.5 (H) 0.3 - 1.2 mg/dL    Comment: HEMOLYSIS AT THIS LEVEL MAY AFFECT RESULT   GFR, Estimated >60 >60 mL/min    Comment: (NOTE) Calculated using the CKD-EPI Creatinine Equation (2021)    Anion gap 12 5 - 15    Comment: Performed at [facility], [address] [place], [place] [address]  Troponin I (High Sensitivity)     Status: None   Collection Time: 12/01/22  2:29 PM  Result Value Ref Range   Troponin I (High Sensitivity) <2 <18 ng/L    Comment: (NOTE) Elevated high sensitivity troponin I (hsTnI) values and significant  changes across serial measurements may suggest ACS but many other  chronic and acute conditions are known to elevate hsTnI results.  Refer to the "Links" section for chest pain algorithms and additional  guidance. Performed at [facility], [address], [place], [place] [address]   Troponin I (High Sensitivity)     Status: None   Collection Time: 12/01/22  5:18 PM  Result Value Ref Range   Troponin I (High Sensitivity) <2 <18 ng/L    Comment: (NOTE) Elevated high sensitivity troponin I (hsTnI) values and significant  changes across serial measurements may suggest ACS but many other  chronic and acute conditions are known to elevate hsTnI results.  Refer to the "Links" section for chest pain algorithms and additional  guidance. Performed at [facility], [address] [place], [place] [address]   hCG, serum, qualitative     Status: None   Collection Time: 12/01/22  8:50 PM  Result Value Ref Range   Preg, Serum NEGATIVE NEGATIVE    Comment:        THE SENSITIVITY OF THIS METHODOLOGY IS >10 mIU/mL. Performed at [facility], [address] [place], [place] [address]   Protime-INR     Status: None   Collection Time: 12/01/22  8:50 PM  Result Value Ref Range   Prothrombin Time 14.7 11.4 - 15.2 seconds  INR 1.1 0.8 - 1.2    Comment: (NOTE) INR goal varies based on device and disease states. Performed at [facility], [address], [place], [place] [address]   APTT     Status: None   Collection Time: 12/01/22  8:50 PM  Result Value Ref Range   aPTT 30 24 - 36 seconds    Comment: Performed at [facility], [address] [place], [place] [address]    Assessment/Plan: No problem-specific Assessment & Plan notes found for this encounter.

## 2023-01-15 ENCOUNTER — Ambulatory Visit: Payer: 59 | Admitting: Neurology

## 2023-02-04 ENCOUNTER — Inpatient Hospital Stay: Payer: 59 | Admitting: Hematology and Oncology

## 2023-03-17 ENCOUNTER — Inpatient Hospital Stay: Payer: 59 | Attending: Hematology and Oncology | Admitting: Hematology and Oncology

## 2023-05-21 ENCOUNTER — Encounter: Payer: Self-pay | Admitting: Hematology and Oncology

## 2023-05-28 ENCOUNTER — Ambulatory Visit: Payer: Self-pay | Admitting: Family Medicine

## 2023-12-04 ENCOUNTER — Ambulatory Visit: Payer: Self-pay

## 2023-12-04 ENCOUNTER — Encounter: Payer: Self-pay | Admitting: Hematology and Oncology

## 2023-12-04 NOTE — Telephone Encounter (Signed)
 FYI Only or Action Required?: Action required by provider: request for appointment.  Patient was last seen in primary care on 12/16/2022 by Merna Huxley, NP.  Called Nurse Triage reporting Chest Pain.  Symptoms began several weeks ago.  Interventions attempted: Nothing.  Symptoms are: unchanged.Chest pain that comes and goes x 2 weeks. Thinks anxiety is a factor. Declines OV today. Will go to ED for worsening of symptoms, appointment next week.  Triage Disposition: See Physician Within 24 Hours  Patient/caregiver understands and will follow disposition?: No, refuses disposition    Copied from CRM [id number]. Topic: Clinical - Red Word Triage >> Dec 04, 2023  9:40 AM Gennette ORN wrote: Patient is having chest pain. Her chest is tight ,she is having anxiety , if she can rate it from 1 to 10 it would be a 7 . This has been going on for 2 weeks now as well. Answer Assessment - Initial Assessment Questions 1. LOCATION: Where does it hurt?       Middle 2. RADIATION: Does the pain go anywhere else? (e.g., into neck, jaw, arms, back)     no 3. ONSET: When did the chest pain begin? (Minutes, hours or days)      On going 4. PATTERN: Does the pain come and go, or has it been constant since it started?  Does it get worse with exertion?      Comes and goes 5. DURATION: How long does it last (e.g., seconds, minutes, hours)     hours 6. SEVERITY: How bad is the pain?  (e.g., Scale 1-10; mild, moderate, or severe)     Now - 5 7. CARDIAC RISK FACTORS: Do you have any history of heart problems or risk factors for heart disease? (e.g., angina, prior heart attack; diabetes, high blood pressure, high cholesterol, smoker, or strong family history of heart disease)     no 8. PULMONARY RISK FACTORS: Do you have any history of lung disease?  (e.g., blood clots in lung, asthma, emphysema, birth control pills)     no 9. CAUSE: What do you think is causing the chest pain?     unsure 10.  OTHER SYMPTOMS: Do you have any other symptoms? (e.g., dizziness, nausea, vomiting, sweating, fever, difficulty breathing, cough)       anxiety 11. PREGNANCY: Is there any chance you are pregnant? When was your last menstrual period?       no  Protocols used: Chest Pain-A-AH

## 2023-12-08 ENCOUNTER — Encounter: Payer: Self-pay | Admitting: Hematology and Oncology

## 2023-12-09 ENCOUNTER — Encounter: Payer: Self-pay | Admitting: Hematology and Oncology

## 2023-12-09 ENCOUNTER — Ambulatory Visit: Attending: Adult Health

## 2023-12-09 ENCOUNTER — Ambulatory Visit: Payer: Self-pay | Admitting: Adult Health

## 2023-12-09 ENCOUNTER — Encounter: Payer: Self-pay | Admitting: Adult Health

## 2023-12-09 VITALS — BP 140/80 | Temp 97.5°F | Ht 65.0 in | Wt 120.0 lb

## 2023-12-09 DIAGNOSIS — F419 Anxiety disorder, unspecified: Secondary | ICD-10-CM

## 2023-12-09 DIAGNOSIS — R002 Palpitations: Secondary | ICD-10-CM

## 2023-12-09 DIAGNOSIS — R0789 Other chest pain: Secondary | ICD-10-CM

## 2023-12-09 MED ORDER — BUPROPION HCL ER (XL) 150 MG PO TB24
150.0000 mg | ORAL_TABLET | Freq: Every day | ORAL | 0 refills | Status: DC
Start: 2023-12-09 — End: 2024-01-01

## 2023-12-09 NOTE — Progress Notes (Signed)
 Subjective:    Patient ID: Nancy Hunter, female    DOB: 10/30/1990, 33 y.o.   MRN: [id number]  HPI 33 year old female who  has a past medical history of Anemia, Anxiety, Chronic back pain, Depression, and PCOS (polycystic ovarian syndrome).  Presents to the office today for  mid sternal/right sided chest pain.  This pain started roughly 2 weeks ago and has been present every day sometimes multiple times a day.  Pain lasts from a few minutes to half a day.  Pain is described as a tightness and it feels like she cannot take a deep breath.  She reports that she will often feel like her heart is beating fast and she has palpitations.  She does feel as though her anxiety and stress has been uncontrolled and possibly getting worse as time goes on.  She does not always have anxiety when she has the pain though.  She denies trauma or aggravating injury.  He denies nausea, vomiting, diarrhea, or abdominal pain.   She has had similar chest pain in the past, was seen in the emergency room on December 01, 2022 for similar symptoms in which her workup including CTA head and neck, CT angio neck, CT head, and MRI of the brain were within normal limits.  Review of Systems See HPI   Past Medical History:  Diagnosis Date   Anemia    Anxiety    Chronic back pain    Depression    PCOS (polycystic ovarian syndrome)     Social History   Socioeconomic History   Marital status: Significant Other    Spouse name: Not on file   Number of children: Not on file   Years of education: Not on file   Highest education level: Not on file  Occupational History   Not on file  Tobacco Use   Smoking status: Former    Current packs/day: 0.00    Types: Cigarettes    Quit date: 10/24/2020    Years since quitting: 3.1   Smokeless tobacco: Never  Vaping Use   Vaping status: Former   Quit date: 10/24/2020  Substance and Sexual Activity   Alcohol use: Not Currently    Comment: not while preg   Drug use: No   Sexual  activity: Yes    Birth control/protection: None  Other Topics Concern   Not on file  Social History Narrative   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, community   Social Drivers of Corporate investment banker Strain: Not on file  Food Insecurity: Not on file  Transportation Needs: Not on file  Physical Activity: Not on file  Stress: Not on file  Social Connections: Not on file  Intimate Partner Violence: Not on file    Past Surgical History:  Procedure Laterality Date   CESAREAN SECTION MULTI-GESTATIONAL N/A 10/25/2021   Procedure: CESAREAN SECTION MULTI-GESTATIONAL;  Surgeon: Storm Setter, DO;  Location: [facility];  Service: Obstetrics;  Laterality: N/A;  Request OB Fellow   COLPOSCOPY     ECTOPIC PREGNANCY SURGERY  04/09/2016   WISDOM TOOTH EXTRACTION      Family History  Problem Relation Age of Onset   Kidney disease Maternal Aunt    Breast cancer Maternal Aunt    Hypertension Maternal Grandmother    Hyperlipidemia Maternal Grandmother    Arthritis Maternal Grandmother    Diabetes Maternal Grandfather    Hypertension Maternal Grandfather     Allergies  Allergen Reactions  Latex Hives    Current Outpatient Medications on File Prior to Visit  Medication Sig Dispense Refill   cyclobenzaprine  (FLEXERIL ) 10 MG tablet Take 1 tablet (10 mg total) by mouth at bedtime. 15 tablet 0   diphenhydrAMINE  (BENADRYL ) 25 MG tablet Take 25 mg by mouth daily as needed for allergies.     docusate sodium  (COLACE) 100 MG capsule Take 100 mg by mouth daily as needed for mild constipation.     ferrous sulfate  325 (65 FE) MG tablet Take 1 tablet (325 mg total) by mouth 2 (two) times daily with a meal. 60 tablet 3   folic acid (FOLVITE) 1 MG tablet Take 1 mg by mouth daily.     ibuprofen  (ADVIL ) 600 MG tablet Take 1 tablet (600 mg total) by mouth every 6 (six) hours as needed for mild pain, moderate pain or cramping. 30 tablet 1   methylPREDNISolone  (MEDROL  DOSEPAK) 4 MG TBPK tablet Take as  directed 21 tablet 0   oxyCODONE  (OXY IR/ROXICODONE ) 5 MG immediate release tablet Take 1-2 tablets (5-10 mg total) by mouth every 6 (six) hours as needed for moderate pain, severe pain or breakthrough pain. 30 tablet 0   Prenatal Vit-Fe Fumarate-FA (PRENATAL MULTIVITAMIN) TABS tablet Take 1 tablet by mouth daily.     No current facility-administered medications on file prior to visit.    There were no vitals taken for this visit.      Objective:   Physical Exam Vitals and nursing note reviewed.  Constitutional:      Appearance: She is well-developed.  Cardiovascular:     Rate and Rhythm: Normal rate and regular rhythm.     Pulses: Normal pulses.     Heart sounds: Normal heart sounds.  Pulmonary:     Effort: Pulmonary effort is normal.     Breath sounds: Normal breath sounds.  Chest:     Chest wall: Tenderness present.    Musculoskeletal:        General: Normal range of motion.  Skin:    General: Skin is warm and dry.  Neurological:     General: No focal deficit present.     Mental Status: She is alert and oriented to person, place, and time.  Psychiatric:        Mood and Affect: Mood normal.        Behavior: Behavior normal.        Thought Content: Thought content normal.        Judgment: Judgment normal.        Assessment & Plan:  1. Midsternal chest pain (Primary) - Does not appear to be cardiac related. No concern for PE or dissection. Will order cardiac monitoring due to feeling of palpitations. Consider referral to Cardiology  - Possible anxiety vs costochondritis - EKG 12-Lead- SR with a Rate of 61 - CBC; Future - Comprehensive metabolic panel with GFR; Future - Comprehensive metabolic panel with GFR - CBC - LONG TERM MONITOR XT (3-14 DAYS); Future  2. Anxiety - Will start on Wellbutrin   - Follow up in 30 days  - buPROPion  (WELLBUTRIN  XL) 150 MG 24 hr tablet; Take 1 tablet (150 mg total) by mouth daily.  Dispense: 30 tablet; Refill: 0  3.  Palpitations  - LONG TERM MONITOR XT (3-14 DAYS); Future  Darleene Shape, NP

## 2023-12-09 NOTE — Progress Notes (Unsigned)
 EP to read.

## 2023-12-10 ENCOUNTER — Ambulatory Visit: Payer: Self-pay | Admitting: Adult Health

## 2023-12-10 DIAGNOSIS — I471 Supraventricular tachycardia, unspecified: Secondary | ICD-10-CM

## 2023-12-10 LAB — CBC
HCT: 35 % — ABNORMAL LOW (ref 36.0–46.0)
Hemoglobin: 11.4 g/dL — ABNORMAL LOW (ref 12.0–15.0)
MCHC: 32.6 g/dL (ref 30.0–36.0)
MCV: 82.5 fl (ref 78.0–100.0)
Platelets: 159 K/uL (ref 150.0–400.0)
RBC: 4.25 Mil/uL (ref 3.87–5.11)
RDW: 13.3 % (ref 11.5–15.5)
WBC: 7.7 K/uL (ref 4.0–10.5)

## 2023-12-10 LAB — COMPREHENSIVE METABOLIC PANEL WITH GFR
ALT: 9 U/L (ref 0–35)
AST: 17 U/L (ref 0–37)
Albumin: 4.5 g/dL (ref 3.5–5.2)
Alkaline Phosphatase: 41 U/L (ref 39–117)
BUN: 10 mg/dL (ref 6–23)
CO2: 30 meq/L (ref 19–32)
Calcium: 8.9 mg/dL (ref 8.4–10.5)
Chloride: 103 meq/L (ref 96–112)
Creatinine, Ser: 0.89 mg/dL (ref 0.40–1.20)
GFR: 84.28 mL/min (ref >60.00)
Glucose, Bld: 50 mg/dL — ABNORMAL LOW (ref 70–99)
Potassium: 3.3 meq/L — ABNORMAL LOW (ref 3.5–5.1)
Sodium: 140 meq/L (ref 135–145)
Total Bilirubin: 0.5 mg/dL (ref 0.2–1.2)
Total Protein: 7.3 g/dL (ref 6.0–8.3)

## 2023-12-27 DIAGNOSIS — R002 Palpitations: Secondary | ICD-10-CM

## 2023-12-27 DIAGNOSIS — R0789 Other chest pain: Secondary | ICD-10-CM | POA: Diagnosis not present

## 2023-12-31 ENCOUNTER — Telehealth: Payer: Self-pay | Admitting: *Deleted

## 2023-12-31 NOTE — Telephone Encounter (Signed)
**Note De-identified  [place] Obfuscation** Please advise 

## 2023-12-31 NOTE — Telephone Encounter (Signed)
 Patient notified of update  and verbalized understanding.

## 2023-12-31 NOTE — Telephone Encounter (Unsigned)
 Copied from CRM [id number]. Topic: Clinical - Medication Question >> Dec 31, 2023  1:13 PM Franky GRADE wrote: Reason for CRM: Patient has not started taking buPROPion  (WELLBUTRIN  XL) 150 MG 24 hr tablet [[id number]], she was wearing a heart monitor and was referred to a cardiologist, she would like to know if she should start taking the medication or hold off until she see's the cardiologist.

## 2024-01-01 ENCOUNTER — Other Ambulatory Visit: Payer: Self-pay | Admitting: Adult Health

## 2024-01-01 DIAGNOSIS — F419 Anxiety disorder, unspecified: Secondary | ICD-10-CM

## 2024-01-01 NOTE — Telephone Encounter (Unsigned)
 Copied from CRM #[id number]. Topic: Clinical - Medication Refill >> Jan 01, 2024  1:14 PM Jayma L wrote: Medication: buPROPion  (WELLBUTRIN  XL) 150 MG 24 hr tablet  Has the patient contacted their pharmacy? Yes (Agent: If no, request that the patient contact the pharmacy for the refill. If patient does not wish to contact the pharmacy document the reason why and proceed with request.) (Agent: If yes, when and what did the pharmacy advise?)  This is the patient's preferred pharmacy:  [id number] [place], [place] - [address] [facility] [address] [facility] [address] [facility] [address] [place] [address] Phone: [phone] Fax: [phone]  Is this the correct pharmacy for this prescription? Yes If no, delete pharmacy and type the correct one.   Has the prescription been filled recently? No  Is the patient out of the medication? Yes  Has the patient been seen for an appointment in the last year OR does the patient have an upcoming appointment? Yes  Can we respond through MyChart? Yes  Agent: Please be advised that Rx refills may take up to 3 business days. We ask that you follow-up with your pharmacy.

## 2024-01-05 MED ORDER — BUPROPION HCL ER (XL) 150 MG PO TB24
150.0000 mg | ORAL_TABLET | Freq: Every day | ORAL | 0 refills | Status: AC
Start: 2024-01-05 — End: ?

## 2024-01-08 ENCOUNTER — Ambulatory Visit: Payer: Self-pay

## 2024-01-08 NOTE — Telephone Encounter (Signed)
 FYI Only or Action Required?: FYI only for provider.  Patient was last seen in primary care on 12/09/2023 by Merna Huxley, NP.  Called Nurse Triage reporting Hip Injury.  Symptoms began a week ago.  Interventions attempted: Other: Has been seen at urgent care and by her chiropractor.  Symptoms are: unchanged.  Triage Disposition: See PCP When Office is Open (Within 3 Days)  Patient/caregiver understands and will follow disposition?: Yes       Copied from CRM #[id number]. Topic: Clinical - Red Word Triage >> Jan 08, 2024  1:33 PM Drema MATSU wrote: Red Word that prompted transfer to Nurse Triage: Patient slipped on one of her kids Lego and hit her right hip. She stated that it is hurting a lot. She has already went to Urgent and a scan X-rays and then went to chiropractor to have it adjusted but it still isn't better.         Reason for Disposition  [1] After 3 days AND [2] still limping  Answer Assessment - Initial Assessment Questions 1. MECHANISM: How did the injury happen? (e.g., twisting injury, direct blow)      Slipped and fell  2. ONSET: When did the injury happen? (e.g., minutes, hours ago)      1 week ago  3. LOCATION: Where is the injury located?      Right hip 4. APPEARANCE of INJURY: What does the injury look like?  (e.g., deformity of leg)     No deformity  5. SEVERITY: Can you put weight on that leg? Can you walk?      Able to bear weight with pain, walking with a limp using her left leg for most of her support  6. SIZE: For cuts, bruises, or swelling, ask: How large is it? (e.g., inches or centimeters;  entire joint)      Some swelling  7. PAIN: Is there pain? If Yes, ask: How bad is the pain?   What does it keep you from doing? (Scale 0-10; or none, mild, moderate, severe)     Moderate to severe  8. TETANUS: For any breaks in the skin, ask: When was your last tetanus booster?     N/A 9. OTHER SYMPTOMS: Do you have any other  symptoms?      Some pain in back and other hip due to compensating for pain 10. PREGNANCY: Is there any chance you are pregnant? When was your last menstrual period?      No  Protocols used: Hip Injury-A-AH

## 2024-01-11 ENCOUNTER — Ambulatory Visit: Admitting: Family Medicine

## 2024-01-20 ENCOUNTER — Ambulatory Visit: Admitting: Cardiology

## 2024-01-27 ENCOUNTER — Other Ambulatory Visit (HOSPITAL_COMMUNITY): Payer: Self-pay

## 2024-01-27 ENCOUNTER — Encounter: Payer: Self-pay | Admitting: Hematology and Oncology

## 2024-01-27 ENCOUNTER — Ambulatory Visit: Attending: Cardiovascular Disease | Admitting: Cardiology

## 2024-01-27 VITALS — BP 110/80 | HR 72 | Ht 65.0 in | Wt 111.2 lb

## 2024-01-27 DIAGNOSIS — E282 Polycystic ovarian syndrome: Secondary | ICD-10-CM

## 2024-01-27 DIAGNOSIS — I471 Supraventricular tachycardia, unspecified: Secondary | ICD-10-CM | POA: Diagnosis not present

## 2024-01-27 DIAGNOSIS — R072 Precordial pain: Secondary | ICD-10-CM

## 2024-01-27 DIAGNOSIS — R002 Palpitations: Secondary | ICD-10-CM | POA: Diagnosis not present

## 2024-01-27 MED ORDER — DILTIAZEM HCL ER COATED BEADS 120 MG PO CP24
120.0000 mg | ORAL_CAPSULE | Freq: Every day | ORAL | 3 refills | Status: DC
Start: 2024-01-27 — End: 2024-04-12
  Filled 2024-01-27: qty 30, 30d supply, fill #0
  Filled 2024-02-21: qty 30, 30d supply, fill #1

## 2024-01-27 NOTE — Patient Instructions (Signed)
 Medication Instructions:  Start Cardizem  120 mg daily Continue all other medications *If you need a refill on your cardiac medications before your next appointment, please call your pharmacy*  Lab Work:  Tsh today  Testing/Procedures: Echo   first available  Treadmill  Do Not eat or drink 3 hours prior  You May Have Water Do Not Consume caffeine or decaf 12 hours before Wear comfortable clothes and shoes No perfume No lotion   Follow-Up: At [facility], you and your health needs are our priority.  As part of our continuing mission to provide you with exceptional heart care, our providers are all part of one team.  This team includes your primary Cardiologist (physician) and Advanced Practice Providers or APPs (Physician Assistants and Nurse Practitioners) who all work together to provide you with the care you need, when you need it.  Your next appointment:  3 months    Provider:  Dr.Tolia       Schedule appointment with Dr.Taylor   Symptomatic SVT   We recommend signing up for the patient portal called MyChart.  Sign up information is provided on this After Visit Summary.  MyChart is used to connect with patients for Virtual Visits (Telemedicine).  Patients are able to view lab/test results, encounter notes, upcoming appointments, etc.  Non-urgent messages can be sent to your provider as well.   To learn more about what you can do with MyChart, go to [email or web address].

## 2024-01-27 NOTE — Progress Notes (Signed)
 Cardiology Office Note:    NAME:  Nancy Hunter    MRN: [id number] DOB:  [birth year]   PCP:  Merna Huxley, NP  Former Cardiology Providers: NA Primary Cardiologist:  Madonna Large, DO, [facility] (established care 01/27/2024) Electrophysiologist:  None   Referring MD: Merna Huxley, NP  Reason of Consult: Supraventricular tachycardia  Chief Complaint  Patient presents with   Chest Pain   Palpitations    History of Present Illness:    Nancy Hunter is a 33 y.o. African-American female whose past medical history and cardiovascular risk factors includes: Anemia, Anxiety, Chronic back pain, Depression, and PCOS (polycystic ovarian syndrome), former smoker. She is being seen today for the evaluation of supraventricular tachycardia at the request of Merna Huxley, NP.  In July 2025 patient followed up with her primary care provider for evaluation of chest pain and palpitations.  During which time a cardiac monitor was ordered for further evaluation.  Cardiac monitor illustrated underlying rhythm to be sinus but had frequent episodes of supraventricular tachycardia and therefore referred to cardiology for further evaluation and management.  Chest Pain:  Started in mid - July  Now more frequent and intense.  Substernal  Minutes (up to ) Non-radiating  Intensity: 10/10 at its worse, avg 5/10 Occurs two to three times a day Worse w/: stressful days and at times w/ walking.   Better w/: resting and breathing exercises. Has seen PCP regarding her CP.   Palpitation.  Since July 2025 Daily  Duration: 5 -15 minutes  Intermittently  Better: Resting and deep breathing exercises  Worse: Stress  No syncope a/w her palpitations.   Factors to consider: History of thyroid  disease: No History of anemia: Yes, goes to cancer center at [facility] and has needed Fe infusion in the past.  Coffee consumption: none Caffeinated beverages/sodas: none Energy drinks: none Alcohol use: Rarely  - once every few months Stimulants: None Illicit drugs: None Weight loss supplements: None New over-the-counter medications: None Herbal supplements: None Any prior workup: recent cardiac monitor  No prior hx of DVT/PE.   No first degree relatives with premature coronary disease or sudden cardiac death.  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 No structured exercise program or daily routine.  Current Medications: Current Meds  Medication Sig   buPROPion  (WELLBUTRIN  XL) 150 MG 24 hr tablet Take 1 tablet (150 mg total) by mouth daily.   diltiazem  (CARDIZEM  CD) 120 MG 24 hr capsule Take 1 capsule (120 mg total) by mouth daily.     Allergies:    Latex   Past Medical History: Past Medical History:  Diagnosis Date   Anemia    Anxiety    Chronic back pain    Depression    PCOS (polycystic ovarian syndrome)     Past Surgical History: Past Surgical History:  Procedure Laterality Date   CESAREAN SECTION MULTI-GESTATIONAL N/A 10/25/2021   Procedure: CESAREAN SECTION MULTI-GESTATIONAL;  Surgeon: Storm Setter, DO;  Location: [facility];  Service: Obstetrics;  Laterality: N/A;  Request OB Fellow   COLPOSCOPY     ECTOPIC PREGNANCY SURGERY  04/09/2016   WISDOM TOOTH EXTRACTION      Social History: Social History   Tobacco Use   Smoking status: Former    Current packs/day: 0.00    Types: Cigarettes    Quit date: 10/24/2020    Years since quitting: 3.2   Smokeless tobacco: Never  Vaping Use   Vaping status: Former   Quit date:  10/24/2020  Substance Use Topics   Alcohol use: Not Currently    Comment: not while preg   Drug use: No    Family History: Family History  Problem Relation Age of Onset   Kidney disease Maternal Aunt    Breast cancer Maternal Aunt    Hypertension Maternal Grandmother    Hyperlipidemia Maternal Grandmother    Arthritis Maternal Grandmother    Diabetes Maternal Grandfather    Hypertension Maternal Grandfather     ROS:   Review of Systems   Cardiovascular:  Positive for chest pain and palpitations. Negative for claudication, irregular heartbeat, leg swelling, near-syncope, orthopnea, paroxysmal nocturnal dyspnea and syncope.  Respiratory:  Negative for shortness of breath.   Hematologic/Lymphatic: Negative for bleeding problem.    EKGs/Labs/Other Studies Reviewed:   EKG: EKG Interpretation Date/Time:  Wednesday January 27 2024 08:04:07 EDT Ventricular Rate:  72 PR Interval:  182 QRS Duration:  82 QT Interval:  364 QTC Calculation: 398 R Axis:   75  Text Interpretation: Normal sinus rhythm with sinus arrhythmia Normal ECG When compared with ECG of 01-Dec-2022 12:59, No significant change since last tracing Confirmed by Michele Richardson ([id number]) on 01/27/2024 8:16:15 AM  Echocardiogram: NA  Cardiac Monitor:  August 2025 NSR with sinus brady (44/min) and sinus tachy (157/min), ave 76/min. 29 episodes of Sustained SVT at 214/min, longest 50 seconds. Symptoms associated with SVT No VT or atrial fib. Rare PAC's and PVC's, (<1%) No prolonged pauses Patch Wear Time:  5 days and 2 hours (2025-07-27T21:53:38-0400 to 2025-08-02T00:09:07-398)  Labs:    Latest Ref Rng & Units 12/09/2023    4:09 PM 12/01/2022   11:51 AM 12/01/2022   11:46 AM  CBC  WBC 4.0 - 10.5 K/uL 7.7  5.4    Hemoglobin 12.0 - 15.0 g/dL 88.5  9.7  89.7   Hematocrit 36.0 - 46.0 % 35.0  29.5  30.0   Platelets 150.0 - 400.0 K/uL 159.0  147         Latest Ref Rng & Units 12/09/2023    4:09 PM 12/01/2022   11:51 AM 12/01/2022   11:46 AM  BMP  Glucose 70 - 99 mg/dL 50  67  70   BUN 6 - 23 mg/dL 10  9  9    Creatinine 0.40 - 1.20 mg/dL 9.10  9.22  9.49   Sodium 135 - 145 mEq/L 140  137  140   Potassium 3.5 - 5.1 mEq/L 3.3  5.0  4.9   Chloride 96 - 112 mEq/L 103  110  111   CO2 19 - 32 mEq/L 30  15    Calcium 8.4 - 10.5 mg/dL 8.9  7.4        Latest Ref Rng & Units 12/09/2023    4:09 PM 12/01/2022   11:51 AM 12/01/2022   11:46 AM  CMP  Glucose 70 - 99  mg/dL 50  67  70   BUN 6 - 23 mg/dL 10  9  9    Creatinine 0.40 - 1.20 mg/dL 9.10  9.22  9.49   Sodium 135 - 145 mEq/L 140  137  140   Potassium 3.5 - 5.1 mEq/L 3.3  5.0  4.9   Chloride 96 - 112 mEq/L 103  110  111   CO2 19 - 32 mEq/L 30  15    Calcium 8.4 - 10.5 mg/dL 8.9  7.4    Total Protein 6.0 - 8.3 g/dL 7.3  5.3    Total Bilirubin 0.2 -  1.2 mg/dL 0.5  1.5    Alkaline Phos 39 - 117 U/L 41  35    AST 0 - 37 U/L 17  42    ALT 0 - 35 U/L 9  24      No results found for: CHOL, HDL, LDLCALC, LDLDIRECT, TRIG, CHOLHDL No results for input(s): LIPOA in the last 8760 hours. No components found for: NTPROBNP No results for input(s): PROBNP in the last 8760 hours. Recent Labs    01/27/24 0925 02/02/24 1618  TSH 0.340* 0.17*    Physical Exam:    Today's Vitals   01/27/24 0807  BP: 110/80  Pulse: 72  Weight: 111 lb 4 oz (50.5 kg)  Height: 5' 5 (1.651 m)   Body mass index is 18.51 kg/m. Wt Readings from Last 3 Encounters:  02/02/24 109 lb (49.4 kg)  01/27/24 111 lb 4 oz (50.5 kg)  12/09/23 120 lb (54.4 kg)    Physical Exam  Constitutional: No distress.  hemodynamically stable  Neck: No JVD present.  Cardiovascular: Normal rate, regular rhythm, S1 normal and S2 normal. Exam reveals no gallop, no S3 and no S4.  No murmur heard. Pulmonary/Chest: Effort normal and breath sounds normal. No stridor. She has no wheezes. She has no rales.  Musculoskeletal:        General: No edema.     Cervical back: Neck supple.  Skin: Skin is warm.     Impression & Recommendation(s):  Impression:   ICD-10-CM   1. Palpitations  R00.2 EKG 12-Lead    TSH    diltiazem  (CARDIZEM  CD) 120 MG 24 hr capsule    2. Supraventricular tachycardia (HCC)  I47.10 diltiazem  (CARDIZEM  CD) 120 MG 24 hr capsule    Ambulatory referral to Cardiac Electrophysiology    3. Precordial pain  R07.2 ECHOCARDIOGRAM COMPLETE    Exercise Tolerance Test    4. PCOS (polycystic ovarian syndrome)   E28.2        Recommendation(s):  Palpitations Supraventricular tachycardia (HCC) Will hold off on beta blocker due to underlying depression Check TSH level Prescribe Cardizem  120 mg once daily. Refer to Dr. Waddell from EP for further evaluation. Coordinate with primary care provider for stress management.  Precordial pain Predominantly noncardiac. EKG nonischemic. Echo will be ordered to evaluate for structural heart disease and left ventricular systolic function. Exercise treadmill stress test to evaluate for exercise-induced arrhythmia/ischemia Advised to seek medical attention if chest pain worsens in intensity frequency or duration.  PCOS (polycystic ovarian syndrome) Management per primary team   Orders Placed:  Orders Placed This Encounter  Procedures   TSH   Ambulatory referral to Cardiac Electrophysiology    Referral Priority:   Routine    Referral Type:   Consultation    Referral Reason:   Specialty Services Required    Requested Specialty:   Cardiology    Number of Visits Requested:   1   Exercise Tolerance Test    Standing Status:   Future    Expected Date:   02/03/2024    Expiration Date:   01/26/2025    Where [facility] this test [facility] performed[facility]    Stress with pharmacologic or treadmill ?:   Treadmill w/ exercise   EKG 12-Lead   ECHOCARDIOGRAM COMPLETE    Standing Status:   Future    Expected Date:   02/03/2024    Expiration Date:   01/26/2025    Where should this test be performed:   [facility]    Does the patient weigh less than or greater than 250 lbs?:   Patient weighs less than 250 lbs    Perflutren DEFINITY (image enhancing agent) should be administered unless hypersensitivity or allergy exist:   Administer Perflutren    Reason for exam-Echo:   Chest Pain  R07.9     Final Medication List:    Meds ordered this encounter  Medications   diltiazem  (CARDIZEM  CD) 120 MG 24 hr capsule    Sig: Take 1 capsule (120 mg total) by  mouth daily.    Dispense:  90 capsule    Refill:  3    There are no discontinued medications.   Current Outpatient Medications:    buPROPion  (WELLBUTRIN  XL) 150 MG 24 hr tablet, Take 1 tablet (150 mg total) by mouth daily., Disp: 30 tablet, Rfl: 0   diltiazem  (CARDIZEM  CD) 120 MG 24 hr capsule, Take 1 capsule (120 mg total) by mouth daily., Disp: 90 capsule, Rfl: 3  Consent:   Informed Consent   Shared Decision Making/Informed Consent The risks [chest pain, shortness of breath, cardiac arrhythmias, dizziness, blood pressure fluctuations, myocardial infarction, stroke/transient ischemic attack, and life-threatening complications (estimated to be 1 in 10,000)], benefits (risk stratification, diagnosing coronary artery disease, treatment guidance) and alternatives of an exercise tolerance test were discussed in detail with Nancy Hunter and she agrees to proceed.     Disposition:   3 months. Patient may be asked to follow-up sooner based on the results of the above-mentioned testing.  Her questions and concerns were addressed to her satisfaction. She voices understanding of the recommendations provided during this encounter.    Signed, Madonna Michele HAS, [facility]  A Division of [facility] [address], [place], [place] [address]  02/06/2024 1:41 PM

## 2024-01-28 LAB — TSH: TSH: 0.34 u[IU]/mL — ABNORMAL LOW (ref 0.450–4.500)

## 2024-01-29 ENCOUNTER — Ambulatory Visit: Payer: Self-pay | Admitting: Cardiology

## 2024-02-02 ENCOUNTER — Encounter: Payer: Self-pay | Admitting: Adult Health

## 2024-02-02 ENCOUNTER — Ambulatory Visit: Admitting: Adult Health

## 2024-02-02 VITALS — BP 120/60 | HR 87 | Temp 99.0°F | Ht 65.0 in | Wt 109.0 lb

## 2024-02-02 DIAGNOSIS — R7989 Other specified abnormal findings of blood chemistry: Secondary | ICD-10-CM | POA: Diagnosis not present

## 2024-02-02 NOTE — Progress Notes (Signed)
 Subjective:    Patient ID: Nancy Hunter, female    DOB: Apr 05, 1991, 33 y.o.   MRN: [id number]  HPI 33 year old female who  has a past medical history of Anemia, Anxiety, Chronic back pain, Depression, and PCOS (polycystic ovarian syndrome).  She was recently seen by Cardiology for SVT.  During the workup it was noted that her TSH was low and she was advised to follow up with her PCP for further evaluation.   Lab Results  Component Value Date   TSH 0.340 (L) 01/27/2024   She reports that she has been having a hard time gaining weight .    Review of Systems See HPI   Past Medical History:  Diagnosis Date   Anemia    Anxiety    Chronic back pain    Depression    PCOS (polycystic ovarian syndrome)     Social History   Socioeconomic History   Marital status: Significant Other    Spouse name: Not on file   Number of children: Not on file   Years of education: Not on file   Highest education level: Not on file  Occupational History   Not on file  Tobacco Use   Smoking status: Former    Current packs/day: 0.00    Types: Cigarettes    Quit date: 10/24/2020    Years since quitting: 3.2   Smokeless tobacco: Never  Vaping Use   Vaping status: Former   Quit date: 10/24/2020  Substance and Sexual Activity   Alcohol use: Not Currently    Comment: not while preg   Drug use: No   Sexual activity: Yes    Birth control/protection: None  Other Topics Concern   Not on file  Social History Narrative   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, community   Social Drivers of Corporate investment banker Strain: Not on file  Food Insecurity: Not on file  Transportation Needs: Not on file  Physical Activity: Not on file  Stress: Not on file  Social Connections: Not on file  Intimate Partner Violence: Not on file    Past Surgical History:  Procedure Laterality Date   CESAREAN SECTION MULTI-GESTATIONAL N/A 10/25/2021   Procedure: CESAREAN SECTION MULTI-GESTATIONAL;  Surgeon: Storm Setter, DO;  Location: [facility];  Service: Obstetrics;  Laterality: N/A;  Request OB Fellow   COLPOSCOPY     ECTOPIC PREGNANCY SURGERY  04/09/2016   WISDOM TOOTH EXTRACTION      Family History  Problem Relation Age of Onset   Kidney disease Maternal Aunt    Breast cancer Maternal Aunt    Hypertension Maternal Grandmother    Hyperlipidemia Maternal Grandmother    Arthritis Maternal Grandmother    Diabetes Maternal Grandfather    Hypertension Maternal Grandfather     Allergies  Allergen Reactions   Latex Hives    Current Outpatient Medications on File Prior to Visit  Medication Sig Dispense Refill   buPROPion  (WELLBUTRIN  XL) 150 MG 24 hr tablet Take 1 tablet (150 mg total) by mouth daily. 30 tablet 0   cyclobenzaprine  (FLEXERIL ) 10 MG tablet Take 1 tablet (10 mg total) by mouth at bedtime. 15 tablet 0   diltiazem  (CARDIZEM  CD) 120 MG 24 hr capsule Take 1 capsule (120 mg total) by mouth daily. 90 capsule 3   diphenhydrAMINE  (BENADRYL ) 25 MG tablet Take 25 mg by mouth daily as needed for allergies.     docusate sodium  (COLACE) 100 MG capsule  Take 100 mg by mouth daily as needed for mild constipation.     ferrous sulfate  325 (65 FE) MG tablet Take 1 tablet (325 mg total) by mouth 2 (two) times daily with a meal. 60 tablet 3   folic acid (FOLVITE) 1 MG tablet Take 1 mg by mouth daily.     ibuprofen  (ADVIL ) 600 MG tablet Take 1 tablet (600 mg total) by mouth every 6 (six) hours as needed for mild pain, moderate pain or cramping. 30 tablet 1   Prenatal Vit-Fe Fumarate-FA (PRENATAL MULTIVITAMIN) TABS tablet Take 1 tablet by mouth daily.     No current facility-administered medications on file prior to visit.    BP 120/60   Pulse 87   Temp 99 F (37.2 C) (Oral)   Ht 5' 5 (1.651 m)   Wt 109 lb (49.4 kg)   SpO2 96%   BMI 18.14 kg/m       Objective:   Physical Exam Vitals and nursing note reviewed.  Constitutional:      Appearance: Normal appearance.  Cardiovascular:      Rate and Rhythm: Normal rate and regular rhythm.     Pulses: Normal pulses.     Heart sounds: Normal heart sounds.  Pulmonary:     Breath sounds: Normal breath sounds.  Musculoskeletal:        General: Normal range of motion.  Skin:    General: Skin is warm and dry.     Capillary Refill: Capillary refill takes less than 2 seconds.  Neurological:     General: No focal deficit present.     Mental Status: She is alert and oriented to person, place, and time.  Psychiatric:        Mood and Affect: Mood normal.        Behavior: Behavior normal.        Thought Content: Thought content normal.        Judgment: Judgment normal.        Assessment & Plan:   1. Abnormal TSH (Primary) - Will recheck TSH and add Free T3 and Free T4. Likely need to refer to endocrinology  - TSH; Future - T4, Free; Future - T3, Free; Future   Darleene Shape, NP

## 2024-02-03 ENCOUNTER — Ambulatory Visit: Payer: Self-pay | Admitting: Adult Health

## 2024-02-03 ENCOUNTER — Other Ambulatory Visit: Payer: Self-pay | Admitting: Adult Health

## 2024-02-03 DIAGNOSIS — E0590 Thyrotoxicosis, unspecified without thyrotoxic crisis or storm: Secondary | ICD-10-CM

## 2024-02-03 LAB — T3, FREE: T3, Free: 3.1 pg/mL (ref 2.3–4.2)

## 2024-02-03 LAB — T4, FREE: Free T4: 0.8 ng/dL (ref 0.60–1.60)

## 2024-02-03 LAB — TSH: TSH: 0.17 u[IU]/mL — ABNORMAL LOW (ref 0.35–5.50)

## 2024-02-06 ENCOUNTER — Encounter: Payer: Self-pay | Admitting: Cardiology

## 2024-02-15 ENCOUNTER — Encounter (HOSPITAL_COMMUNITY): Payer: Self-pay | Admitting: *Deleted

## 2024-02-23 NOTE — Addendum Note (Signed)
 Addended by: MICHELE MADONNA GRADE on: 02/23/2024 02:15 PM   Modules accepted: Orders

## 2024-02-24 ENCOUNTER — Ambulatory Visit: Attending: Cardiology | Admitting: Internal Medicine

## 2024-02-24 ENCOUNTER — Ambulatory Visit (HOSPITAL_COMMUNITY)
Admission: RE | Admit: 2024-02-24 | Discharge: 2024-02-24 | Disposition: A | Discharge status: Home / Self Care | Source: Ambulatory Visit | Attending: Cardiology | Admitting: Cardiology

## 2024-02-24 ENCOUNTER — Encounter: Payer: Self-pay | Admitting: Internal Medicine

## 2024-02-24 VITALS — BP 104/68 | HR 91 | Ht 65.0 in | Wt 107.0 lb

## 2024-02-24 DIAGNOSIS — R002 Palpitations: Secondary | ICD-10-CM

## 2024-02-24 DIAGNOSIS — R072 Precordial pain: Secondary | ICD-10-CM | POA: Diagnosis not present

## 2024-02-24 DIAGNOSIS — I471 Supraventricular tachycardia, unspecified: Secondary | ICD-10-CM

## 2024-02-24 LAB — EXERCISE TOLERANCE TEST
Angina Index: 0
Base ST Depression (mm): 0 mm
Duke Treadmill Score: 7
Estimated workload: 8.5
Exercise duration (min): 7 min
Exercise duration (sec): 0 s
MPHR: 185 {beats}/min
Peak HR: 190 {beats}/min
Percent HR: 102 %
Rest HR: 85 {beats}/min
ST Depression (mm): 0 mm

## 2024-02-24 NOTE — Progress Notes (Signed)
 HPI Nancy Hunter is referred by Dr. Michele for evaluation of SVT. She is a pleasant 33 yo woman with a h/o SVT. She had twins born just over 2 years ago. She has documented near syncope and SVT at over 200/min. She notes that the cardizem  has had no effect on her symptoms. Her SVT has been treated in the past with IV adenosine. Her symptoms have been present over a year. 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  Allergies  Allergen Reactions   Latex Hives     Current Outpatient Medications  Medication Sig Dispense Refill   buPROPion  (WELLBUTRIN  XL) 150 MG 24 hr tablet Take 1 tablet (150 mg total) by mouth daily. 30 tablet 0   diltiazem  (CARDIZEM  CD) 120 MG 24 hr capsule Take 1 capsule (120 mg total) by mouth daily. 90 capsule 3   No current facility-administered medications for this visit.     Past Medical History:  Diagnosis Date   Anemia    Anxiety    Chronic back pain    Depression    PCOS (polycystic ovarian syndrome)     ROS:   All systems reviewed and negative except as noted in the HPI.   Past Surgical History:  Procedure Laterality Date   CESAREAN SECTION MULTI-GESTATIONAL N/A 10/25/2021   Procedure: CESAREAN SECTION MULTI-GESTATIONAL;  Surgeon: Storm Setter, DO;  Location: [facility];  Service: Obstetrics;  Laterality: N/A;  Request OB Fellow   COLPOSCOPY     ECTOPIC PREGNANCY SURGERY  04/09/2016   WISDOM TOOTH EXTRACTION       Family History  Problem Relation Age of Onset   Kidney disease Maternal Aunt    Breast cancer Maternal Aunt    Hypertension Maternal Grandmother    Hyperlipidemia Maternal Grandmother    Arthritis Maternal Grandmother    Diabetes Maternal Grandfather    Hypertension Maternal Grandfather      Social History   Socioeconomic History   Marital status: Significant Other    Spouse name: Not on file   Number of children: Not on file   Years of education: Not on file   Highest education level: Not on file   Occupational History   Not on file  Tobacco Use   Smoking status: Former    Current packs/day: 0.00    Types: Cigarettes    Quit date: 10/24/2020    Years since quitting: 3.3   Smokeless tobacco: Never  Vaping Use   Vaping status: Former   Quit date: 10/24/2020  Substance and Sexual Activity   Alcohol use: Not Currently    Comment: not while preg   Drug use: No   Sexual activity: Yes    Birth control/protection: None  Other Topics Concern   Not on file  Social History Narrative   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, community   Social Drivers of Corporate investment banker Strain: Not on file  Food Insecurity: Not on file  Transportation Needs: Not on file  Physical Activity: Not on file  Stress: Not on file  Social Connections: Not on file  Intimate Partner Violence: Not on file     BP 104/68   Pulse 91   Ht 5' 5 (1.651 m)   Wt 107 lb (48.5 kg)   LMP 02/22/2024   SpO2 97%   BMI 17.81 kg/m   Physical Exam:  Well appearing 33 yo woman, NAD HEENT: Unremarkable Neck:  No JVD, no  thyromegally Lymphatics:  No adenopathy Back:  No CVA tenderness Lungs:  Clear with no wheezes HEART:  Regular rate rhythm, no murmurs, no rubs, no clicks Abd:  soft, positive bowel sounds, no organomegally, no rebound, no guarding Ext:  2 plus pulses, no edema, no cyanosis, no clubbing Skin:  No rashes no nodules Neuro:  CN II through XII intact, motor grossly intact  EKG - reviewed from a month ago. NSR with no pre-excitation.  Assess/Plan: SVT - I have discussed the treatment options with the patient and the risks/benefits/goals/expectations of EP study and catheter ablation were reviewed. We have tentatively scheduled the patient to proceed.  Thyroid  dysfunction - Her FT4 and T3 are normal but her TSH is low. She has lost weight. She is pending endocrine eval.   [name] COME

## 2024-02-24 NOTE — Patient Instructions (Addendum)
   Testing/Procedures: Ablation Your physician has recommended that you have an ablation. Catheter ablation is a medical procedure used to treat some cardiac arrhythmias (irregular heartbeats). During catheter ablation, a long, thin, flexible tube is put into a blood vessel in your groin (upper thigh), or neck. This tube is called an ablation catheter. It is then guided to your heart through the blood vessel. Radio frequency waves destroy small areas of heart tissue where abnormal heartbeats may cause an arrhythmia to start.   You are scheduled for SVT Ablation on Friday, December 19 with Dr. Dr. Waddell. Please arrive at the Main Entrance A at [facility]: [address] [place], [place] [address] at  What To Expect:  Labs: you will need to have lab work drawn within 30 days of your procedure. Please go to any [facility] location to have these drawn - no appointment is needed. Cardiac CT Scan: this will be done about 3-4 weeks prior to your procedure. You will be contacted to schedule this test. You will receive procedure instructions either through MyChart or in the mail 4-6 week prior to your procedure.  After your procedure we recommend no driving for 4 days, no lifting over 5 lbs for 7 days, and no work or strenuous activity for 7 days.  Please contact our office at [phone] if you have any questions.    Follow-Up: We will contact you to schedule your post-procedure appointments.      SABRA

## 2024-02-26 NOTE — Progress Notes (Signed)
 Patient viewed in [place] of her stress test    Last read by Yancy JONELLE Fireman at 3:17PM on 02/25/2024

## 2024-03-02 ENCOUNTER — Ambulatory Visit (HOSPITAL_COMMUNITY)

## 2024-03-03 ENCOUNTER — Other Ambulatory Visit (HOSPITAL_COMMUNITY): Payer: Self-pay

## 2024-03-24 ENCOUNTER — Ambulatory Visit (HOSPITAL_COMMUNITY)
Admission: RE | Admit: 2024-03-24 | Discharge: 2024-03-24 | Disposition: A | Discharge status: Home / Self Care | Source: Ambulatory Visit | Attending: Cardiovascular Disease | Admitting: Cardiovascular Disease

## 2024-03-24 DIAGNOSIS — R072 Precordial pain: Secondary | ICD-10-CM | POA: Diagnosis not present

## 2024-03-24 LAB — ECHOCARDIOGRAM COMPLETE
Area-P 1/2: 3.83 cm2
S' Lateral: 3.07 cm

## 2024-03-28 NOTE — Progress Notes (Signed)
 MyChart message containing providers result note and interpretation read by patient on: Last read by Yancy JONELLE Fireman at 6:26AM on 03/28/2024.

## 2024-04-11 ENCOUNTER — Encounter (HOSPITAL_COMMUNITY): Payer: Self-pay | Admitting: Emergency Medicine

## 2024-04-11 ENCOUNTER — Emergency Department (HOSPITAL_COMMUNITY)
Admission: EM | Admit: 2024-04-11 | Discharge: 2024-04-12 | Disposition: A | Discharge status: Home / Self Care | Attending: Emergency Medicine | Admitting: Emergency Medicine

## 2024-04-11 ENCOUNTER — Other Ambulatory Visit: Payer: Self-pay

## 2024-04-11 ENCOUNTER — Emergency Department (HOSPITAL_COMMUNITY)

## 2024-04-11 ENCOUNTER — Telehealth: Payer: Self-pay | Admitting: Cardiology

## 2024-04-11 DIAGNOSIS — R0602 Shortness of breath: Secondary | ICD-10-CM | POA: Diagnosis present

## 2024-04-11 DIAGNOSIS — Z91040 Latex allergy status: Secondary | ICD-10-CM | POA: Insufficient documentation

## 2024-04-11 DIAGNOSIS — R0789 Other chest pain: Secondary | ICD-10-CM | POA: Insufficient documentation

## 2024-04-11 DIAGNOSIS — R079 Chest pain, unspecified: Secondary | ICD-10-CM

## 2024-04-11 LAB — CBC
HCT: 36.4 % (ref 36.0–46.0)
Hemoglobin: 12.1 g/dL (ref 12.0–15.0)
MCH: 27.2 pg (ref 26.0–34.0)
MCHC: 33.2 g/dL (ref 30.0–36.0)
MCV: 81.8 fL (ref 80.0–100.0)
Platelets: 247 K/uL (ref 150–400)
RBC: 4.45 MIL/uL (ref 3.87–5.11)
RDW: 13.1 % (ref 11.5–15.5)
WBC: 6.4 K/uL (ref 4.0–10.5)
nRBC: 0 % (ref 0.0–0.2)

## 2024-04-11 LAB — BASIC METABOLIC PANEL WITH GFR
Anion gap: 11 (ref 5–15)
BUN: 11 mg/dL (ref 6–20)
CO2: 25 mmol/L (ref 22–32)
Calcium: 9 mg/dL (ref 8.9–10.3)
Chloride: 105 mmol/L (ref 98–111)
Creatinine, Ser: 0.75 mg/dL (ref 0.44–1.00)
GFR, Estimated: >60 mL/min (ref >60)
Glucose, Bld: 73 mg/dL (ref 70–99)
Potassium: 3.3 mmol/L — ABNORMAL LOW (ref 3.5–5.1)
Sodium: 141 mmol/L (ref 135–145)

## 2024-04-11 LAB — HCG, SERUM, QUALITATIVE: Preg, Serum: NEGATIVE

## 2024-04-11 LAB — TROPONIN I (HIGH SENSITIVITY)
Troponin I (High Sensitivity): <2 ng/L (ref <18)
Troponin I (High Sensitivity): <2 ng/L (ref <18)

## 2024-04-11 NOTE — Telephone Encounter (Signed)
 Pt states she had a severe SVT episode with stroke-like symptoms this past Saturday 11/22. She compares it to an episode she had about a year ago. Pt requesting c/b to discuss.

## 2024-04-11 NOTE — ED Triage Notes (Signed)
 Patient having a chest pains and shob. Patient has been having intermittent SVT episodes. Patient is supposed to see cardiology tomorrow and is scheduled for a heart procedure in December. She reports the pain is on the left side of chest and radiates into neck.

## 2024-04-11 NOTE — Telephone Encounter (Signed)
 Spoke to patient and she reports that on Saturday she has a episode of possible SVT at work. Pt reports that she felt like her heart rate spiked, became weak, felt like she couldn't talk and then when able speech was slurred, felt cold, disoriented, fingers were blue and difficulty walking. 911 was called and when EMS arrived heart rate was 120 bpm with BP around 158/90. Did not go to ER. Pt reported that she had a mild episode yesterday with heart racing, chest pain and shortness of breath. Pt states that she still does not feel right. Pt advised to go to the emergency room for evaluation.   Pt reports that she has been off the use of Cardizem  since 02/24/24. Pt reports that she has thyroid  dysfunction and was under the impression that was supposed to stop her Cardizem . Should patient still be taking this?   Pt scheduled to see Dr. Michele on 04/12/24.

## 2024-04-12 ENCOUNTER — Other Ambulatory Visit (HOSPITAL_COMMUNITY): Payer: Self-pay

## 2024-04-12 ENCOUNTER — Encounter: Payer: Self-pay | Admitting: Hematology and Oncology

## 2024-04-12 ENCOUNTER — Ambulatory Visit: Attending: Cardiology | Admitting: Cardiology

## 2024-04-12 ENCOUNTER — Ambulatory Visit: Payer: Self-pay

## 2024-04-12 ENCOUNTER — Encounter: Payer: Self-pay | Admitting: Cardiology

## 2024-04-12 VITALS — BP 112/78 | HR 107 | Ht 65.0 in | Wt 107.0 lb

## 2024-04-12 DIAGNOSIS — E282 Polycystic ovarian syndrome: Secondary | ICD-10-CM

## 2024-04-12 DIAGNOSIS — R002 Palpitations: Secondary | ICD-10-CM

## 2024-04-12 DIAGNOSIS — I471 Supraventricular tachycardia, unspecified: Secondary | ICD-10-CM | POA: Diagnosis not present

## 2024-04-12 DIAGNOSIS — E079 Disorder of thyroid, unspecified: Secondary | ICD-10-CM

## 2024-04-12 DIAGNOSIS — R072 Precordial pain: Secondary | ICD-10-CM | POA: Diagnosis not present

## 2024-04-12 LAB — D-DIMER, QUANTITATIVE: D-Dimer, Quant: 0.33 ug{FEU}/mL (ref 0.00–0.50)

## 2024-04-12 MED ORDER — DILTIAZEM HCL ER COATED BEADS 120 MG PO CP24
120.0000 mg | ORAL_CAPSULE | Freq: Two times a day (BID) | ORAL | 3 refills | Status: DC
  Filled 2024-04-12 (×2): qty 60, 30d supply, fill #0

## 2024-04-12 MED ORDER — OMRON 3 SERIES BP MONITOR DEVI
1.0000 | Freq: Every day | 0 refills | Status: AC
  Filled 2024-04-12: qty 1, 30d supply, fill #0

## 2024-04-12 NOTE — Discharge Instructions (Addendum)
 Exam lab and imaging were reassuring today.  Please follow-up with established cardiology appointment today and follow all recommendations.  It is important to continue to monitor for any worsening symptoms including worsening chest pain/shortness of breath, fainting, chest pain with nausea and vomiting.  If any of the symptoms return please return to ED for further evaluation.

## 2024-04-12 NOTE — Telephone Encounter (Signed)
 Phone call placed to patient-no answer. Unable to leave a voicemail as mailbox isn't setup. Will place in callbacks.

## 2024-04-12 NOTE — ED Provider Notes (Signed)
 [place] EMERGENCY DEPARTMENT AT [facility] Provider Note   CSN: [id number] Arrival date & time: 04/11/24  8152     Patient presents with: No chief complaint on file.   Nancy Hunter is a 33 y.o. female.  33 year old presents ED with complaints of chest pain and shortness of breath since Saturday.  Patient has significant history of SVT he and reports having a episode on Saturday at work where EMS was called.  By the time EMS arrived her heart rate was 120 but she says she reported feeling cold and had a tingling sensation throughout her body.  She decided not to go to the ER because she was starting to feel a bit better and thought it was just her SVT episode.  She is seen by cardiologist and is set to have an ablation on December 19.  She was prescribed Cardizem  but reports since she has a hyperactive thyroid  the Cardizem  does not work and she is set to see endocrine on the fourth for evaluation.  She does have a cardiology appointment today at 2 PM but when she called cardiology yesterday they advised her to come to the ED because her chest is still hurting from Saturday.  Patient is currently complaining of chest pain that feels like pressure on her chest with no radiation she did report that it prior to today it radiated to her neck but did not last very long.  She also reports some shortness of breath all the time.  Patient does not take hormone therapy and has not had any recent trips or travels.  Patient is not on anticoagulation or blood thinners and does not have any history of DVT or PE.  She does endorse vaping.      Prior to Admission medications   Medication Sig Start Date End Date Taking? Authorizing Provider  buPROPion  (WELLBUTRIN  XL) 150 MG 24 hr tablet Take 1 tablet (150 mg total) by mouth daily. 01/05/24   Nafziger, Darleene, NP  diltiazem  (CARDIZEM  CD) 120 MG 24 hr capsule Take 1 capsule (120 mg total) by mouth daily. 01/27/24 04/26/24  Michele Richardson, DO     Allergies: Latex    Review of Systems  Respiratory:  Positive for chest tightness and shortness of breath.   Cardiovascular:  Positive for chest pain.  All other systems reviewed and are negative.   Updated Vital Signs BP 133/78   Pulse 63   Temp 98 F (36.7 C)   Resp 19   Ht 5' 5 (1.651 m)   Wt 48 kg   SpO2 100%   BMI 17.61 kg/m   Physical Exam Vitals and nursing note reviewed.  Constitutional:      Appearance: Normal appearance.  HENT:     Head: Normocephalic and atraumatic.     Nose: Nose normal.  Eyes:     Extraocular Movements: Extraocular movements intact.     Conjunctiva/sclera: Conjunctivae normal.     Pupils: Pupils are equal, round, and reactive to light.  Cardiovascular:     Rate and Rhythm: Normal rate and regular rhythm.     Comments: No pain to palpation of chest wall. Pulmonary:     Effort: Pulmonary effort is normal. No respiratory distress.     Breath sounds: Normal breath sounds.     Comments: Lungs clear to all fields to auscultation. Abdominal:     General: Abdomen is flat. There is no distension.     Palpations: Abdomen is soft.  Tenderness: There is no abdominal tenderness. There is no guarding.  Musculoskeletal:        General: Normal range of motion.     Cervical back: Normal range of motion.     Comments: No unilateral leg swelling.  Patient has full sensation distal extremities with good range of motion and pulses.  Cap refill is appropriate as well  Skin:    General: Skin is warm.     Capillary Refill: Capillary refill takes less than 2 seconds.  Neurological:     General: No focal deficit present.     Mental Status: She is alert.     Comments: No focal deficits on exam.  Psychiatric:        Mood and Affect: Mood normal.        Behavior: Behavior normal.     (all labs ordered are listed, but only abnormal results are displayed) Labs Reviewed  BASIC METABOLIC PANEL WITH GFR - Abnormal; Notable for the following  components:      Result Value   Potassium 3.3 (*)    All other components within normal limits  CBC  HCG, SERUM, QUALITATIVE  D-DIMER, QUANTITATIVE  TROPONIN I (HIGH SENSITIVITY)  TROPONIN I (HIGH SENSITIVITY)    EKG: EKG Interpretation Date/Time:  Monday April 11 2024 19:24:41 EST Ventricular Rate:  83 PR Interval:  168 QRS Duration:  76 QT Interval:  328 QTC Calculation: 385 R Axis:   84  Text Interpretation: Normal sinus rhythm with sinus arrhythmia Normal ECG When compared with ECG of 27-Jan-2024 08:04, PREVIOUS ECG IS PRESENT No significant change since last tracing Confirmed by Dasie Faden ([id number]) on 04/12/2024 8:43:12 AM  Radiology: [facility] Chest 2 View Result Date: 04/11/2024 CLINICAL DATA:  chest pain EXAM: DG CHEST 2V COMPARISON:  December 01, 2022 FINDINGS: No focal airspace consolidation, pleural effusion, or pneumothorax. No cardiomegaly.No acute fracture or destructive lesion. IMPRESSION: No acute cardiopulmonary abnormality. Electronically Signed   By: Rogelia Myers M.D.   On: 04/11/2024 19:55    Procedures   Medications Ordered in the ED - No data to display  33 y.o. female presents to the ED with complaints of chest pain, SHOB since Saturday, The differential diagnosis includes but not limited to ACS, PE, pneumothorax, arrhythmia (Ddx)  On arrival pt is nontoxic, vitals unremarkable. Exam significant for reported shortness of breath and mild chest pain.  Additional history obtained from chart review significant for patient being seen by cardiology with scheduled ablation in December.  They advised her to stop using Cardizem  because it was not beneficial for her episodes.   Lab Tests:  Labs ordered in triage for BMP CBC troponin hCG with no remarkable findings.  I ordered D Dimer which was negative.  Imaging Studies ordered:  Chest x-ray ordered in triage showed no acute abnormalities.  ED Course:   Patient sitting comfortably in ED bed in no acute  distress.  Patient is reporting shortness of breath and chest tightness since Saturday.  With patient's presentation and continued shortness of breath D-dimer will be ordered to rule out PE and EKG will be repeated.   EKG was nonischemic and NSR.  D-dimer was negative.  PE is not suspected.  Was advised to follow-up with cardiology at her established appointment today at 2 PM.  Patient was given strict return precautions.  Patient agreed with treatment plan and is comfortable discharge at this time.   Portions of this note were generated with Scientist, clinical (histocompatibility and immunogenetics). Dictation errors may occur  despite best attempts at proofreading.    Final diagnoses:  SOB (shortness of breath)  Chest pain, unspecified type    ED Discharge Orders     None          Myriam Fonda GORMAN DEVONNA 04/12/24 0919    Charlyn Sora, MD 04/12/24 1019

## 2024-04-12 NOTE — ED Notes (Signed)
 Pt asleep and not wanting vitals at this time

## 2024-04-12 NOTE — Progress Notes (Signed)
 Cardiology Office Note:    NAME:  Nancy Hunter    MRN: [id number] DOB:  Jan 23, 1991   PCP:  Merna Huxley, NP  Former Cardiology Providers: NA Primary Cardiologist:  Madonna Large, DO, [facility] (established care 01/27/2024) Electrophysiologist:  Danelle Birmingham, MD    Chief Complaint  Patient presents with   Follow-up    Recent ER visit     History of Present Illness:    Nancy Hunter is a 33 y.o. African-American female whose past medical history and cardiovascular risk factors includes: Supraventricular tachycardia, anemia, Anxiety, Chronic back pain, Depression, and PCOS (polycystic ovarian syndrome), former smoker.   In July 2025 patient followed up with her primary care provider for evaluation of chest pain and palpitations.  During which time a cardiac monitor was ordered for further evaluation.  Cardiac monitor illustrated underlying rhythm to be sinus but had frequent episodes of supraventricular tachycardia and therefore referred to cardiology for further evaluation and management.  At the last office visit patient was endorsing chest pain as well as palpitations.  She was given Cardizem  120 mg p.o. daily for symptom control and was also referred to EP for further evaluation.  Patient is scheduled for SVT ablation on 05/06/2024.  Precordial pain echocardiogram and GXT were scheduled.  GXT was negative for ischemia but patient did have exercise-induced SVT.  Echocardiogram notes preserved LVEF.  I had ordered labs to evaluate her thyroid  function which came back low concerning for possible hyperthyroidism.  Patient was referred back to PCP for further workup.  Patient called the office yesterday 04/11/2024 and has stopped taking Cardizem  since February 24, 2024.  She also endorsed having symptoms of stroke and therefore was encouraged to go to the ED for further evaluation and management   Based on the ER documentation patient reported having chest pain and shortness of breath.   Stroke workup was not performed.  She was observed for period of time, labs are notable for high sensitive troponins negative x 2, and negative D-dimer levels.  Patient presents today for follow-up.  On 04/09/2024 which is a Saturday patient states that she was at work and felt like she was having strokelike symptoms and EMS was called but patient chose not to go to the ER.  The following day on Sunday, 04/11/2019 5 at night she was experiencing confusion and vision changes which she described as sparkles vision.  No other focal deficits.  On Monday, 04/11/2024 she called our office and she was advised to go to the ED due to a constellation of symptoms.  She waited until Tuesday, 04/12/2024.  Patient states that she stopped taking Cardizem  thinking that it is not working and her palpitations are predominantly due to underlying thyroid  disease.  She has an appointment with endocrinology on April 21, 2024.  And she scheduled for SVT ablation in December 2025 with Dr. Birmingham.  No first degree relatives with premature coronary disease or sudden cardiac death.  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 No structured exercise program or daily routine.  Current Medications: Current Meds  Medication Sig   [EXPIRED] amoxicillin -clavulanate (AUGMENTIN ) 875-125 MG tablet Take 1 tablet by mouth 2 (two) times daily.   Blood Pressure Monitoring (OMRON 3 SERIES BP MONITOR) DEVI Measure blood pressure as directed.   buPROPion  (WELLBUTRIN  XL) 150 MG 24 hr tablet Take 1 tablet (150 mg total) by mouth daily.   diltiazem  (CARDIZEM  CD) 120 MG 24 hr capsule Take 1 capsule (120 mg total)  by mouth 2 (two) times daily.     Allergies:    Latex   Past Medical History: Past Medical History:  Diagnosis Date   Anemia    Anxiety    Chronic back pain    Depression    PCOS (polycystic ovarian syndrome)     Past Surgical History: Past Surgical History:  Procedure Laterality Date   CESAREAN SECTION  MULTI-GESTATIONAL N/A 10/25/2021   Procedure: CESAREAN SECTION MULTI-GESTATIONAL;  Surgeon: Storm Setter, DO;  Location: [facility] LD ORS;  Service: Obstetrics;  Laterality: N/A;  Request OB Fellow   COLPOSCOPY     ECTOPIC PREGNANCY SURGERY  04/09/2016   WISDOM TOOTH EXTRACTION      Social History: Social History   Tobacco Use   Smoking status: Former    Current packs/day: 0.00    Types: Cigarettes    Quit date: 10/24/2020    Years since quitting: 3.4   Smokeless tobacco: Never  Vaping Use   Vaping status: Former   Quit date: 10/24/2020  Substance Use Topics   Alcohol use: Not Currently    Comment: not while preg   Drug use: No    Family History: Family History  Problem Relation Age of Onset   Kidney disease Maternal Aunt    Breast cancer Maternal Aunt    Hypertension Maternal Grandmother    Hyperlipidemia Maternal Grandmother    Arthritis Maternal Grandmother    Diabetes Maternal Grandfather    Hypertension Maternal Grandfather     ROS:   Review of Systems  Cardiovascular:  Positive for palpitations. Negative for chest pain, claudication, irregular heartbeat, leg swelling, near-syncope, orthopnea, paroxysmal nocturnal dyspnea and syncope.  Respiratory:  Negative for shortness of breath.   Hematologic/Lymphatic: Negative for bleeding problem.    EKGs/Labs/Other Studies Reviewed:   EKG: 04/12/2024: Sinus rhythm, 63 bpm, normal axis, without underlying ischemia or injury pattern.  Echocardiogram: March 24 2024 LVEF: 50 to 55% Diastolic Function: normal Right ventricle: size normal, function mildly reduced Regurgitation: No significant Stenosis: No Estimated RAP See report for additional details  Exercise treadmill stress test 02/24/2024   A Bruce protocol stress test was performed. Patient exercised for 7 min and 0 sec. Maximum HR of 190 bpm. MPHR 102.0%. Peak METS 8.5. The patient experienced no angina during the test. The test was stopped because the patient  experienced fatigue. The patient reported no symptoms during the stress test.   No ST deviation was noted. Arrhythmias during stress: SVT. There were no arrhythmias during recovery. ECG was interpretable. The ECG was positive for exercised-induced SVT and negative for ischemia.  Cardiac Monitor:  August 2025 NSR with sinus brady (44/min) and sinus tachy (157/min), ave 76/min. 29 episodes of Sustained SVT at 214/min, longest 50 seconds. Symptoms associated with SVT No VT or atrial fib. Rare PAC's and PVC's, (<1%) No prolonged pauses Patch Wear Time:  5 days and 2 hours (2025-07-27T21:53:38-0400 to 2025-08-02T00:09:07-398)  Labs:    Latest Ref Rng & Units 04/11/2024    7:30 PM 12/09/2023    4:09 PM 12/01/2022   11:51 AM  CBC  WBC 4.0 - 10.5 K/uL 6.4  7.7  5.4   Hemoglobin 12.0 - 15.0 g/dL 87.8  88.5  9.7   Hematocrit 36.0 - 46.0 % 36.4  35.0  29.5   Platelets 150 - 400 K/uL 247  159.0  147        Latest Ref Rng & Units 04/11/2024    7:30 PM 12/09/2023    4:09  PM 12/01/2022   11:51 AM  BMP  Glucose 70 - 99 mg/dL 73  50  67   BUN 6 - 20 mg/dL 11  10  9    Creatinine 0.44 - 1.00 mg/dL 9.24  9.10  9.22   Sodium 135 - 145 mmol/L 141  140  137   Potassium 3.5 - 5.1 mmol/L 3.3  3.3  5.0   Chloride 98 - 111 mmol/L 105  103  110   CO2 22 - 32 mmol/L 25  30  15    Calcium 8.9 - 10.3 mg/dL 9.0  8.9  7.4       Latest Ref Rng & Units 04/11/2024    7:30 PM 12/09/2023    4:09 PM 12/01/2022   11:51 AM  CMP  Glucose 70 - 99 mg/dL 73  50  67   BUN 6 - 20 mg/dL 11  10  9    Creatinine 0.44 - 1.00 mg/dL 9.24  9.10  9.22   Sodium 135 - 145 mmol/L 141  140  137   Potassium 3.5 - 5.1 mmol/L 3.3  3.3  5.0   Chloride 98 - 111 mmol/L 105  103  110   CO2 22 - 32 mmol/L 25  30  15    Calcium 8.9 - 10.3 mg/dL 9.0  8.9  7.4   Total Protein 6.0 - 8.3 g/dL  7.3  5.3   Total Bilirubin 0.2 - 1.2 mg/dL  0.5  1.5   Alkaline Phos 39 - 117 U/L  41  35   AST 0 - 37 U/L  17  42   ALT 0 - 35 U/L  9  24      No results found for: CHOL, HDL, LDLCALC, LDLDIRECT, TRIG, CHOLHDL No results for input(s): LIPOA in the last 8760 hours. No components found for: NTPROBNP No results for input(s): PROBNP in the last 8760 hours. Recent Labs    01/27/24 0925 02/02/24 1618  TSH 0.340* 0.17*    Physical Exam:    Today's Vitals   04/12/24 1404  BP: 112/78  Pulse: (!) 107  SpO2: 96%  Weight: 107 lb (48.5 kg)  Height: 5' 5 (1.651 m)  PainSc: 7   PainLoc: Chest    Body mass index is 17.81 kg/m. Wt Readings from Last 3 Encounters:  04/12/24 107 lb (48.5 kg)  04/11/24 105 lb 13.1 oz (48 kg)  02/24/24 107 lb (48.5 kg)    Physical Exam  Constitutional: No distress.  hemodynamically stable  Neck: No JVD present.  Cardiovascular: Regular rhythm, S1 normal and S2 normal. Tachycardia present. Exam reveals no gallop, no S3 and no S4.  No murmur heard. Pulmonary/Chest: Effort normal and breath sounds normal. No stridor. She has no wheezes. She has no rales.  Musculoskeletal:        General: No edema.     Cervical back: Neck supple.  Skin: Skin is warm.     Impression & Recommendation(s):  Impression:   ICD-10-CM   1. Palpitations  R00.2 diltiazem  (CARDIZEM  CD) 120 MG 24 hr capsule    Blood Pressure Monitoring (OMRON 3 SERIES BP MONITOR) DEVI    2. Supraventricular tachycardia  I47.10     3. Precordial pain  R07.2     4. Thyroid  disease  E07.9     5. PCOS (polycystic ovarian syndrome)  E28.2         Recommendation(s):  Palpitations Supraventricular tachycardia (HCC) Initial office visit check TSH as part of palpitations workup which  suggests underlying thyroid  disease.  She has an appointment to see endocrinology on 04/21/2024.  Patient is strongly advised to keep this appointment. At the last office visit did not give her beta-blocker therapy for palpitations due to history of depression.   She was started on a low-dose Cardizem  120 mg p.o. daily.  She felt  that symptoms were not well-controlled and stopped taking it.  I educated her that admitting response to medications differently and I started her on the lowest dose possible to avoid any side effect profile.   Keeping that in mind we will increase Cardizem  to 120 mg p.o. twice daily with holding parameters  She is also scheduled for SVT ablation in December 2025.    Precordial pain Noncardiac. Likely secondary to underlying palpitations. Echo November 2025: Preserved LVEF and no significant valvular heart disease. GXT October 2025: Negative for ischemia Though the probability of underlying obstructive CAD is low patient is educated on what cardiac chest pain feels like and if present or for if her atypical symptoms increase in intensity frequency or duration she is advised to seek medical attention by going to the ER for more expedited evaluation  Thyroid  disease: Suspect hyperthyroidism. Further workup per endocrinology December 2025  PCOS (polycystic ovarian syndrome) Management per primary team  Patient was requesting time off due to her underlying thyroid  disease, palpitations.  She is thinking about considering applying for FMLA.  I have asked her to follow-up with PCP and cardiology can provide supporting documents if needed until the current workup is complete.  Patient agreeable to plan.  Orders Placed:  No orders of the defined types were placed in this encounter.    Final Medication List:    Meds ordered this encounter  Medications   diltiazem  (CARDIZEM  CD) 120 MG 24 hr capsule    Sig: Take 1 capsule (120 mg total) by mouth 2 (two) times daily.    Dispense:  180 capsule    Refill:  3    change to cardizem  cd generic bid per secure chat with Dr Michele due to cost of sr formulation 04/12/24 ALB, pt aware of holding parameters   Blood Pressure Monitoring (OMRON 3 SERIES BP MONITOR) DEVI    Sig: Measure blood pressure as directed.    Dispense:  1 each    Refill:  0     Medications Discontinued During This Encounter  Medication Reason   diltiazem  (CARDIZEM  CD) 120 MG 24 hr capsule Change in therapy     Current Outpatient Medications:    Blood Pressure Monitoring (OMRON 3 SERIES BP MONITOR) DEVI, Measure blood pressure as directed., Disp: 1 each, Rfl: 0   buPROPion  (WELLBUTRIN  XL) 150 MG 24 hr tablet, Take 1 tablet (150 mg total) by mouth daily., Disp: 30 tablet, Rfl: 0   diltiazem  (CARDIZEM  CD) 120 MG 24 hr capsule, Take 1 capsule (120 mg total) by mouth 2 (two) times daily., Disp: 180 capsule, Rfl: 3  Consent:    NA  Disposition:   08/2024  Patient may be asked to follow-up sooner based on the results of the above-mentioned testing.  Her questions and concerns were addressed to her satisfaction. She voices understanding of the recommendations provided during this encounter.    Signed, Madonna Michele HAS, [facility]  A Division of [facility] [address] [place], [place] [address]

## 2024-04-12 NOTE — Patient Instructions (Addendum)
 Medication Instructions:  STOP taking Diltiazem  CD  START taking Diltiazem  CD (Cardizem  CD) 120 mg, take one (1) tablet by mouth twice daily. HOLD if Systolic Blood Pressure (top number) is less than 100 or if Heart Rate is less than 60 beats per minute. *If you need a refill on your cardiac medications before your next appointment, please call your pharmacy*  Lab Work: None ordered  If you have labs (blood work) drawn today and your tests are completely normal, you will receive your results only by: MyChart Message (if you have MyChart) OR A paper copy in the mail If you have any lab test that is abnormal or we need to change your treatment, we will call you to review the results.  Testing/Procedures: None ordered  Follow-Up: At [facility], you and your health needs are our priority.  As part of our continuing mission to provide you with exceptional heart care, our providers are all part of one team.  This team includes your primary Cardiologist (physician) and Advanced Practice Providers or APPs (Physician Assistants and Nurse Practitioners) who all work together to provide you with the care you need, when you need it.  Your next appointment:   APRIL 2026  Provider:   Arye Weyenberg, DO    We recommend signing up for the patient portal called MyChart.  Sign up information is provided on this After Visit Summary.  MyChart is used to connect with patients for Virtual Visits (Telemedicine).  Patients are able to view lab/test results, encounter notes, upcoming appointments, etc.  Non-urgent messages can be sent to your provider as well.   To learn more about what you can do with MyChart, go to [email or web address].

## 2024-04-12 NOTE — Telephone Encounter (Signed)
 Copied from CRM #[id number]. Topic: Clinical - Red Word Triage >> Apr 12, 2024  3:05 PM Alexandria E wrote: [facility] that prompted transfer to Nurse Triage: Shortness of breath. Patient stated she had an episode this past Saturday at work where her heart rate was elevated and has been having breathing issues since, was seen in ED yesterday/this morning but not doing better.  Phone call placed to patient-no answer. Unable to leave a voicemail as patient's mailbox isn't setup to receive messages. Will place in callbacks.

## 2024-04-12 NOTE — Telephone Encounter (Signed)
 FYI Only or Action Required?: FYI only for provider: appointment scheduled on 12/3 for hospital follow up.  Patient was last seen in primary care on 02/02/2024 by Merna Huxley, NP.  Called Nurse Triage reporting Shortness of Breath.  Symptoms began 3 days ago.  Interventions attempted: Other: seen in the ED.  Symptoms are: still present.  Triage Disposition: Call PCP Within 24 Hours  Patient/caregiver understands and will follow disposition?: Yes, hospital follow up appointment scheduled for 12/3       Reason for Disposition  Condition / symptoms SAME (not improving)  Answer Assessment - Initial Assessment Questions Patient seen in the ED yesterday and saw her cardiologist today. Hospital follow up scheduled for 12/03. Patient states she also has FMLA paperwork that she will come in and drop off prior to her appointment.      1. RESPIRATORY STATUS: Describe your breathing? (e.g., wheezing, shortness of breath, unable to speak, severe coughing)      Shortness of breath  2. ONSET: When did this breathing problem begin?      3 days ago  3. PATTERN Does the difficult breathing come and go, or has it been constant since it started?      Constant    4. SEVERITY: How bad is your breathing? (e.g., mild, moderate, severe)      Mild to moderate  5. RECURRENT SYMPTOM: Have you had difficulty breathing before? If Yes, ask: When was the last time? and What happened that time?      Yes 6. CARDIAC HISTORY: Do you have any history of heart disease? (e.g., heart attack, angina, bypass surgery, angioplasty)      Yes 7. LUNG HISTORY: Do you have any history of lung disease?  (e.g., pulmonary embolus, asthma, emphysema)     No 8. CAUSE: What do you think is causing the breathing problem?      Patient was in SVT 9. OTHER SYMPTOMS: Do you have any other symptoms? (e.g., chest pain, cough, dizziness, fever, runny nose)     No 10. O2 SATURATION MONITOR:  Do you use an  oxygen saturation monitor (pulse oximeter) at home? If Yes, ask: What is your reading (oxygen level) today? What is your usual oxygen saturation reading? (e.g., 95%)       No  Answer Assessment - Initial Assessment Questions 1. MAIN CONCERN OR SYMPTOM:  What is your main concern right now? What question do you have? What's the main symptom you're worried about? (e.g., breathing difficulty, ankle swelling, weight gain.)     Shortness of breath, chest pain 2. ONSET: When did the shortness of breath start?     3 days ago 3. BETTER-SAME-WORSE: Are you getting better, staying the same, or getting worse compared to the day you were discharged?     Better  4. HOSPITALIZATION: How long were you hospitalized? (e.g., days)     Seen in ED 1 day 5. DISCHARGE DIAGNOSIS:  What problem or disease were you hospitalized for?     Shortness of breath and Chest pain 6. DISCHARGE DATE: What date were you discharged from the hospital?     11/24 7. DISCHARGE DOCTOR: Who is the main doctor taking care of you now?     Charlyn Sora, MD 8. DISCHARGE APPOINTMENT: Have you scheduled a follow-up discharge appointment with your doctor?     No 9. DISCHARGE MEDICINES: Did the doctor (or NP/PA) who discharged you order any new medicines for you to use? If yes, have you filled the  prescription and started taking the medicine?      No 10. PAIN: Is there any pain? If Yes, ask: How bad is it?  (Scale 0-10; or none, mild, moderate, severe)       Mild  11. FEVER: Do you have a fever? If Yes, ask: What is it, how was it measured  and when did it start?       No 12. OTHER SYMPTOMS: Do you have any other symptoms?       No  Protocols used: Breathing Difficulty-A-AH, Post-Hospitalization Follow-up Call-A-AH

## 2024-04-12 NOTE — ED Notes (Signed)
 Patient discharged to home with self. Patient given written and oral discharge instructions. Patient verbalized understanding. Patient breathing without difficulty and ambulatory to exit. Patient's VS stable. Patient given work note per request. Patient denies questions, needs or concerns before discharge.

## 2024-04-13 ENCOUNTER — Encounter (HOSPITAL_COMMUNITY): Payer: Self-pay

## 2024-04-17 ENCOUNTER — Encounter: Payer: Self-pay | Admitting: Cardiology

## 2024-04-18 ENCOUNTER — Telehealth (HOSPITAL_COMMUNITY): Payer: Self-pay

## 2024-04-18 NOTE — Telephone Encounter (Signed)
 Spoke with patient to discuss upcoming procedure.   Labs: to be completed.   Any recent signs of acute illness or been started on antibiotics? Reports she completed antibiotics last week for sinus and ear infection and that symptoms have resolved. Had ED visit on 11/24 for shortness of breath and chest pain. No concerns at this time. She followed up with cardiology on 11/25 and felt symptoms were secondary to underlying palpitations.       Any new medications started? No Any medications to hold?  Yes. HOLD: Diltiazem  for 2 days prior to your procedure. Last dose will be on Tuesday, December 16  Medication instructions:  On the morning of your procedure DO NOT take any medication. or the procedure may be rescheduled. Nothing to eat or drink after midnight prior to your procedure.  Confirmed patient is scheduled for SVT Ablation on Friday, December 19 with Dr. Waddell. Instructed patient to arrive at the Main Entrance A at [facility]: [address] [place], [place] [address] and check in at Admitting at 5:30 AM.   Plan to go home the same day, you will only stay overnight if medically necessary. You MUST have a responsible adult to drive you home and MUST be with you the first 24 hours after you arrive home or your procedure could be cancelled.  Informed patient a nurse will call a day before the procedure to confirm arrival time and ensure instructions are followed.  Patient verbalized understanding to all instructions provided and agreed to proceed with procedure.   Advised patient to contact RN Navigator at [phone], to inform of any new medications started after call or concerns prior to procedure.

## 2024-04-20 ENCOUNTER — Ambulatory Visit: Admitting: Adult Health

## 2024-04-20 VITALS — BP 100/80 | HR 68 | Temp 98.6°F | Ht 65.0 in | Wt 105.0 lb

## 2024-04-20 DIAGNOSIS — I471 Supraventricular tachycardia, unspecified: Secondary | ICD-10-CM

## 2024-04-20 DIAGNOSIS — E0590 Thyrotoxicosis, unspecified without thyrotoxic crisis or storm: Secondary | ICD-10-CM

## 2024-04-20 NOTE — Progress Notes (Unsigned)
 Subjective:    Patient ID: Nancy Hunter, female    DOB: [birth year], 33 y.o.   MRN: [id number]  HPI She presents to the office today in need of FMLA paperwork filled out.  She has no history of SVT and is scheduled for cardiac ablation on 05/06/2024 and also hyperthyroidism which she is seeing endocrinology for her initial visit with the end of this week.  Most recently she was seen in the emergency room 04/11/2024 for chest pain and shortness of breath.  On 04/10/2024 she was at work and felt as though she was having strokelike symptoms, EMS was called but patient chose not to go to the emergency room, the following day on 04/10/2024 she started to experience confusion with vision changes which she currently describes as sparkles in my vision.  On 04/11/2024 she went to the emergency room after speaking to her cardiologist  Troponin including troponin and D-dimer were negative and she was observed and discharged.  She had stopped taking her Cardizem  thinking that it was not working.   She did have follow-up for pericardial just after being seen in the ER and they placed her back on Cardizem  and increased her dose from twice daily.  Today in the history she has been present as directed but still feels as though it is not working.  She continues to have palpitations and shortness of breath on a daily basis.  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   Review of Systems See HPI   Past Medical History:  Diagnosis Date   Anemia    Anxiety    Chronic back pain    Depression    PCOS (polycystic ovarian syndrome)     Social History   Socioeconomic History   Marital status: Significant Other    Spouse name: Not on file   Number of children: Not on file   Years of education: Not on file   Highest education level: Not on file   Occupational History   Not on file  Tobacco Use   Smoking status: Former    Current packs/day: 0.00    Types: Cigarettes    Quit date: 10/24/2020    Years since quitting: 3.4   Smokeless tobacco: Never  Vaping Use   Vaping status: Former   Quit date: 10/24/2020  Substance and Sexual Activity   Alcohol use: Not Currently    Comment: not while preg   Drug use: No   Sexual activity: Yes    Birth control/protection: None  Other Topics Concern   Not on file  Social History Narrative   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, Community   Social Drivers of Corporate Investment Banker Strain: Not on file  Food Insecurity: Not on file  Transportation Needs: Not on file  Physical Activity: Not on file  Stress: Not on file  Social Connections: Not on file  Intimate Partner Violence: Not on file    Past Surgical History:  Procedure Laterality Date   CESAREAN SECTION MULTI-GESTATIONAL N/A 10/25/2021   Procedure: CESAREAN SECTION MULTI-GESTATIONAL;  Surgeon: Storm Setter, DO;  Location: [facility];  Service: Obstetrics;  Laterality: N/A;  Request OB Fellow   COLPOSCOPY     ECTOPIC PREGNANCY SURGERY  04/09/2016   WISDOM TOOTH EXTRACTION  Family History  Problem Relation Age of Onset   Kidney disease Maternal Aunt    Breast cancer Maternal Aunt    Hypertension Maternal Grandmother    Hyperlipidemia Maternal Grandmother    Arthritis Maternal Grandmother    Diabetes Maternal Grandfather    Hypertension Maternal Grandfather     Allergies  Allergen Reactions   Latex Hives    Current Outpatient Medications on File Prior to Visit  Medication Sig Dispense Refill   Blood Pressure Monitoring (OMRON 3 SERIES BP MONITOR) DEVI Measure blood pressure as directed. 1 each 0   buPROPion  (WELLBUTRIN  XL) 150 MG 24 hr tablet Take 1 tablet (150 mg total) by mouth daily. 30 tablet 0   diltiazem  (CARDIZEM  CD) 120 MG 24 hr capsule Take 1 capsule (120 mg total) by mouth 2 (two) times daily. 180 capsule  3   No current facility-administered medications on file prior to visit.    Temp 98.6 F (37 C) (Oral)   Wt 105 lb (47.6 kg)   LMP 02/27/2024 (Approximate)   BMI 17.47 kg/m       Objective:   Physical Exam Vitals and nursing note reviewed.  Constitutional:      Appearance: Normal appearance.  Cardiovascular:     Rate and Rhythm: Normal rate and regular rhythm.     Pulses: Normal pulses.     Heart sounds: Normal heart sounds.  Pulmonary:     Effort: Pulmonary effort is normal.     Breath sounds: Normal breath sounds.  Skin:    General: Skin is warm and dry.  Neurological:     General: No focal deficit present.     Mental Status: She is alert and oriented to person, place, and time.  Psychiatric:        Mood and Affect: Mood normal.        Behavior: Behavior normal.        Thought Content: Thought content normal.        Judgment: Judgment normal.       Assessment & Plan:  1. Supraventricular tachycardia (Primary) - We filled out her FMLA paperwork together. I placed her on of work until 05/24/2024. If she needs more time after her cardiac ablation and treatment for hyperthyroidism she will let me know.   2. Hyperthyroidism - Follow up with Endocrinology at the end of the week    Darleene Shape, NP  I personally spent a total of 45 minutes in the care of the patient today including preparing to see the patient, getting/reviewing separately obtained history, performing a medically appropriate exam/evaluation, counseling and educating, and documenting clinical information in the EHR.

## 2024-04-21 ENCOUNTER — Encounter: Payer: Self-pay | Admitting: "Endocrinology

## 2024-04-21 ENCOUNTER — Ambulatory Visit (INDEPENDENT_AMBULATORY_CARE_PROVIDER_SITE_OTHER): Admitting: "Endocrinology

## 2024-04-21 VITALS — BP 118/80 | Ht 65.0 in | Wt 105.0 lb

## 2024-04-21 DIAGNOSIS — E0590 Thyrotoxicosis, unspecified without thyrotoxic crisis or storm: Secondary | ICD-10-CM | POA: Diagnosis not present

## 2024-04-21 NOTE — Progress Notes (Signed)
 Outpatient Endocrinology Note Obadiah Birmingham, MD  04/21/24   Nancy Hunter 1990-07-21 [id number]  Referring Provider: Merna Huxley, NP Primary Care Provider: Merna Huxley, NP Subjective  No chief complaint on file.   Assessment & Plan  Diagnoses and all orders for this visit:  Subclinical hyperthyroidism -     TRAb (TSH Receptor Binding Antibody) -     Thyroid  stimulating immunoglobulin -     TSH -     T3, free -     T4, free   Nancy Hunter is currently not taking any thyroid  medication. Patient was biochemically subclinically hyperthyroid on last check.  Discussed the etiology for hyperthyroidism. Educated on thyroid  axis.  Recommend the following: labs today. Repeat labs in 3 months or sooner if symptoms of hyper or hypothyroidism develop.  Counseled on: -complications of untreated hyperthyroidism including atrial fibrillation, heart failure and osteoporosis -side effects of Methimazole including but not limited to allergic reaction, rash, bone marrow suppression, liver dysfunction and teratogenic potential -implications in pregnancy and breastfeeding -compliance and follow up needs    If you notice any symptoms of worsening fatigue, fever with sore throat, loss of appetite, yellowing of eyes, dark urine, joint pains, sores in the mouth, itchy rash, light colored stools or abdominal pain, please stop the medication and call us  immediately as this can be a serious side effect of the medication.   I have reviewed current medications, nurse's notes, allergies, vital signs, past medical and surgical history, family medical history, and social history for this encounter. Counseled patient on symptoms, examination findings, lab findings, imaging results, treatment decisions and monitoring and prognosis. The patient understood the recommendations and agrees with the treatment plan. All questions regarding treatment plan were fully answered.   Return in about 1 day  (around 04/22/2024) for tele-visit.   Obadiah Birmingham, MD  04/21/24   I have reviewed current medications, nurse's notes, allergies, vital signs, past medical and surgical history, family medical history, and social history for this encounter. Counseled patient on symptoms, examination findings, lab findings, imaging results, treatment decisions and monitoring and prognosis. The patient understood the recommendations and agrees with the treatment plan. All questions regarding treatment plan were fully answered.   History of Present Illness Nancy Hunter is a 33 y.o. year old female who presents to our clinic with subclinical hyperthyroidism diagnosed in 01/2024.    Saw PCP for chest pain and palpitation in 11/2023. Found to have SVTs and seeing cardiology.   Symptoms suggestive of HYPOTHYROIDISM:  fatigue Yes weight gain No cold intolerance  Yes constipation  No  Symptoms suggestive of HYPERTHYROIDISM:  weight loss  Yes heat intolerance No hyperdefecation  No palpitations  Yes  Compressive symptoms:  dysphagia  No dysphonia  No positional dyspnea (especially with simultaneous arms elevation)  Yes  Smokes  No On biotin  No Personal history of head/neck surgery/irradiation  No  Never been on thyroid  medication  + family history of thyroid  disease   Grave's Ophthalmopathy Clinical Activity Score: 0/9  Physical Exam  BP 118/80   Ht 5' 5 (1.651 m)   Wt 105 lb (47.6 kg)   LMP 02/27/2024 (Approximate)   BMI 17.47 kg/m  Constitutional: well developed, well nourished Head: normocephalic, atraumatic, no exophthalmos Eyes: sclera anicteric, no redness Neck: no thyromegaly, no thyroid  tenderness; no nodules palpated Lungs: normal respiratory effort Neurology: alert and oriented, + L fine hand tremor Skin: dry, no appreciable rashes Musculoskeletal: no appreciable defects Psychiatric: normal  mood and affect  Allergies Allergies  Allergen Reactions   Latex Hives     Current Medications Patient's Medications  New Prescriptions   No medications on file  Previous Medications   BLOOD PRESSURE MONITORING (OMRON 3 SERIES BP MONITOR) DEVI    Measure blood pressure as directed.   BUPROPION  (WELLBUTRIN  XL) 150 MG 24 HR TABLET    Take 1 tablet (150 mg total) by mouth daily.   DILTIAZEM  (CARDIZEM  CD) 120 MG 24 HR CAPSULE    Take 1 capsule (120 mg total) by mouth 2 (two) times daily.  Modified Medications   No medications on file  Discontinued Medications   No medications on file    Past Medical History Past Medical History:  Diagnosis Date   Anemia    Anxiety    Chronic back pain    Depression    PCOS (polycystic ovarian syndrome)     Past Surgical History Past Surgical History:  Procedure Laterality Date   CESAREAN SECTION MULTI-GESTATIONAL N/A 10/25/2021   Procedure: CESAREAN SECTION MULTI-GESTATIONAL;  Surgeon: Storm Setter, DO;  Location: [facility] LD [facility];  Service: Obstetrics;  Laterality: N/A;  Request OB Fellow   COLPOSCOPY     ECTOPIC PREGNANCY SURGERY  04/09/2016   WISDOM TOOTH EXTRACTION      Family History family history includes Arthritis in her maternal grandmother; Breast cancer in her maternal aunt; Diabetes in her maternal grandfather; Hyperlipidemia in her maternal grandmother; Hypertension in her maternal grandfather and maternal grandmother; Kidney disease in her maternal aunt.  Social History Social History   Socioeconomic History   Marital status: Significant Other    Spouse name: Not on file   Number of children: Not on file   Years of education: Not on file   Highest education level: Not on file  Occupational History   Not on file  Tobacco Use   Smoking status: Former    Current packs/day: 0.00    Types: Cigarettes    Quit date: 10/24/2020    Years since quitting: 3.4   Smokeless tobacco: Never  Vaping Use   Vaping status: Former   Quit date: 10/24/2020  Substance and Sexual Activity   Alcohol use: Not Currently     Comment: not while preg   Drug use: No   Sexual activity: Yes    Birth control/protection: None  Other Topics Concern   Not on file  Social History Narrative   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, Community   Social Drivers of Corporate Investment Banker Strain: Not on file  Food Insecurity: Not on file  Transportation Needs: Not on file  Physical Activity: Not on file  Stress: Not on file  Social Connections: Not on file  Intimate Partner Violence: Not on file    Laboratory Investigations Lab Results  Component Value Date   TSH 0.17 (L) 02/02/2024   TSH 0.340 (L) 01/27/2024   TSH 0.60 01/04/2016   FREET4 0.80 02/02/2024     No results found for: TSI   No components found for: TRAB   No results found for: CHOL No results found for: HDL No results found for: LDLCALC No results found for: TRIG No results found for: [facility] Lab Results  Component Value Date   CREATININE 0.75 04/11/2024   Lab Results  Component Value Date   GFR 84.28 12/09/2023      Component Value Date/Time   NA 141 04/11/2024 1930   K 3.3 (L) 04/11/2024 1930   CL  105 04/11/2024 1930   CO2 25 04/11/2024 1930   GLUCOSE 73 04/11/2024 1930   BUN 11 04/11/2024 1930   CREATININE 0.75 04/11/2024 1930   CREATININE 0.83 01/29/2022 1131   CREATININE 0.94 01/04/2016 1628   CALCIUM 9.0 04/11/2024 1930   PROT 7.3 12/09/2023 1609   ALBUMIN 4.5 12/09/2023 1609   AST 17 12/09/2023 1609   AST 14 (L) 01/29/2022 1131   ALT 9 12/09/2023 1609   ALT 10 01/29/2022 1131   ALKPHOS 41 12/09/2023 1609   BILITOT 0.5 12/09/2023 1609   BILITOT 0.5 01/29/2022 1131   GFRNONAA >60 04/11/2024 1930   GFRNONAA >60 01/29/2022 1131      Latest Ref Rng & Units 04/11/2024    7:30 PM 12/09/2023    4:09 PM 12/01/2022   11:51 AM  BMP  Glucose 70 - 99 mg/dL 73  50  67   BUN 6 - 20 mg/dL 11  10  9    Creatinine 0.44 - 1.00 mg/dL 9.24  9.10  9.22   Sodium 135 - 145 mmol/L 141  140  137   Potassium 3.5 - 5.1 mmol/L  3.3  3.3  5.0   Chloride 98 - 111 mmol/L 105  103  110   CO2 22 - 32 mmol/L 25  30  15    Calcium 8.9 - 10.3 mg/dL 9.0  8.9  7.4        Component Value Date/Time   WBC 6.4 04/11/2024 1930   RBC 4.45 04/11/2024 1930   HGB 12.1 04/11/2024 1930   HGB 12.1 01/29/2022 1131   HGB 10.7 10/13/2017 0000   HCT 36.4 04/11/2024 1930   PLT 247 04/11/2024 1930   PLT 192 01/29/2022 1131   MCV 81.8 04/11/2024 1930   MCH 27.2 04/11/2024 1930   MCHC 33.2 04/11/2024 1930   RDW 13.1 04/11/2024 1930   LYMPHSABS 1.1 12/01/2022 1151   MONOABS 0.4 12/01/2022 1151   EOSABS 0.0 12/01/2022 1151   BASOSABS 0.0 12/01/2022 1151      Parts of this note may have been dictated using voice recognition software. There may be variances in spelling and vocabulary which are unintentional. Not all errors are proofread. Please notify the dino if any discrepancies are noted or if the meaning of any statement is not clear.

## 2024-04-25 ENCOUNTER — Encounter: Payer: Self-pay | Admitting: "Endocrinology

## 2024-04-25 ENCOUNTER — Telehealth: Admitting: "Endocrinology

## 2024-04-25 VITALS — Ht 65.0 in | Wt 105.0 lb

## 2024-04-25 DIAGNOSIS — E010 Iodine-deficiency related diffuse (endemic) goiter: Secondary | ICD-10-CM

## 2024-04-25 LAB — THYROID STIMULATING IMMUNOGLOBULIN: TSI: <89 %{baseline} (ref <140)

## 2024-04-25 LAB — T3, FREE: T3, Free: 3.2 pg/mL (ref 2.3–4.2)

## 2024-04-25 LAB — TRAB (TSH RECEPTOR BINDING ANTIBODY): TRAB: <1 IU/L (ref <=2.00)

## 2024-04-25 LAB — T4, FREE: Free T4: 1.2 ng/dL (ref 0.8–1.8)

## 2024-04-25 LAB — TSH: TSH: 0.51 m[IU]/L

## 2024-04-25 NOTE — Progress Notes (Signed)
 The patient reports they are currently: Nancy Hunter. I spent 10 minutes on the video with the patient on the date of service. I spent an additional 5 minutes on pre- and post-visit activities on the date of service.   The patient was physically located in [place]  or a state in which I am permitted to provide care. The patient and/or parent/guardian understood that s/he may incur co-pays and cost sharing, and agreed to the telemedicine visit. The visit was reasonable and appropriate under the circumstances given the patient's presentation at the time.  The patient and/or parent/guardian has been advised of the potential risks and limitations of this mode of treatment (including, but not limited to, the absence of in-person examination) and has agreed to be treated using telemedicine. The patient's/patient's family's questions regarding telemedicine have been answered.   The patient and/or parent/guardian has also been advised to contact their provider's office for worsening conditions, and seek emergency medical treatment and/or call 911 if the patient deems either necessary.     Outpatient Endocrinology Note Nancy Birmingham, MD  04/25/24   LEHUA FLORES 03-15-1991 [id number]  Referring Provider: Merna Huxley, NP Primary Care Provider: Merna Huxley, NP Subjective  No chief complaint on file.   Assessment & Plan  Diagnoses and all orders for this visit:  Thyromegaly -     US  THYROID ; Future    Duchess R Heydt is currently not taking any thyroid  medication. Patient is biochemically subclinically euthyroid with normal TSH, free T4, free T3 as well as TSH receptor binding antibody checked on 04/21/24.  Patient had subclinical hyperthyroidism noted previously but that could be her baseline and not necessarily due to thyroid  disease.  TSI pending, will follow-up. Discussed the etiology for hyperthyroidism. Educated on thyroid  axis.  Recommend the following: No medication or treatment  indicated.  Recommend following up with cardiology for palpitations. Repeat labs in 3 months or sooner if symptoms of hyper or hypothyroidism develop.  Counseled on: -compliance and follow up needs    Thyromegaly noted on video exam today Patient does have positive Pemberton sign Ordered baseline thyroid  ultrasound  I have reviewed current medications, nurse's notes, allergies, vital signs, past medical and surgical history, family medical history, and social history for this encounter. Counseled patient on symptoms, examination findings, lab findings, imaging results, treatment decisions and monitoring and prognosis. The patient understood the recommendations and agrees with the treatment plan. All questions regarding treatment plan were fully answered.   No follow-ups on file.   Nancy Birmingham, MD  04/25/24   I have reviewed current medications, nurse's notes, allergies, vital signs, past medical and surgical history, family medical history, and social history for this encounter. Counseled patient on symptoms, examination findings, lab findings, imaging results, treatment decisions and monitoring and prognosis. The patient understood the recommendations and agrees with the treatment plan. All questions regarding treatment plan were fully answered.   History of Present Illness Nancy Hunter is a 33 y.o. year old female who presents to our clinic with subclinical hyperthyroidism diagnosed in 01/2024.    Saw PCP for chest pain and palpitation in 11/2023. Found to have SVTs and seeing cardiology.   Never been on thyroid  mediation  Symptoms suggestive of HYPOTHYROIDISM:  fatigue Yes weight gain No cold intolerance  Yes constipation  No  Symptoms suggestive of HYPERTHYROIDISM:  weight loss  Yes heat intolerance No hyperdefecation  No palpitations  Yes  Compressive symptoms:  dysphagia  No dysphonia  No positional dyspnea (especially with simultaneous  arms elevation)   Yes  Smokes  No On biotin  No Personal history of head/neck surgery/irradiation  No  Never been on thyroid  medication  + family history of thyroid  disease   Grave's Ophthalmopathy Clinical Activity Score: 0/9  Physical Exam  Ht 5' 5 (1.651 m)   Wt 105 lb (47.6 kg)   LMP 02/27/2024 (Approximate)   BMI 17.47 kg/m  Constitutional: well developed, well nourished Head: normocephalic, atraumatic, no exophthalmos Eyes: sclera anicteric, no redness Neck: + thyromegaly Lungs: normal respiratory effort Neurology: alert and oriented, + L fine hand tremor Skin: dry, no appreciable rashes Musculoskeletal: no appreciable defects Psychiatric: normal mood and affect  Allergies Allergies  Allergen Reactions   Latex Hives    Current Medications Patient's Medications  New Prescriptions   No medications on file  Previous Medications   BLOOD PRESSURE MONITORING (OMRON 3 SERIES BP MONITOR) DEVI    Measure blood pressure as directed.   BUPROPION  (WELLBUTRIN  XL) 150 MG 24 HR TABLET    Take 1 tablet (150 mg total) by mouth daily.   DILTIAZEM  (CARDIZEM  CD) 120 MG 24 HR CAPSULE    Take 1 capsule (120 mg total) by mouth 2 (two) times daily.  Modified Medications   No medications on file  Discontinued Medications   No medications on file    Past Medical History Past Medical History:  Diagnosis Date   Anemia    Anxiety    Chronic back pain    Depression    PCOS (polycystic ovarian syndrome)     Past Surgical History Past Surgical History:  Procedure Laterality Date   CESAREAN SECTION MULTI-GESTATIONAL N/A 10/25/2021   Procedure: CESAREAN SECTION MULTI-GESTATIONAL;  Surgeon: Storm Setter, DO;  Location: [facility];  Service: Obstetrics;  Laterality: N/A;  Request OB Fellow   COLPOSCOPY     ECTOPIC PREGNANCY SURGERY  04/09/2016   WISDOM TOOTH EXTRACTION      Family History family history includes Arthritis in her maternal grandmother; Breast cancer in her maternal aunt; Diabetes  in her maternal grandfather; Hyperlipidemia in her maternal grandmother; Hypertension in her maternal grandfather and maternal grandmother; Kidney disease in her maternal aunt.  Social History Social History   Socioeconomic History   Marital status: Significant Other    Spouse name: Not on file   Number of children: Not on file   Years of education: Not on file   Highest education level: Not on file  Occupational History   Not on file  Tobacco Use   Smoking status: Former    Current packs/day: 0.00    Types: Cigarettes    Quit date: 10/24/2020    Years since quitting: 3.5   Smokeless tobacco: Never  Vaping Use   Vaping status: Former   Quit date: 10/24/2020  Substance and Sexual Activity   Alcohol use: Not Currently    Comment: not while preg   Drug use: No   Sexual activity: Yes    Birth control/protection: None  Other Topics Concern   Not on file  Social History Narrative   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, Community   Social Drivers of Corporate Investment Banker Strain: Not on [employer] Insecurity: Not on file  Transportation Needs: Not on file  Physical Activity: Not on file  Stress: Not on file  Social Connections: Not on file  Intimate Partner Violence: Not on file    Laboratory Investigations Lab Results  Component Value Date   TSH 0.51  04/21/2024   TSH 0.17 (L) 02/02/2024   TSH 0.340 (L) 01/27/2024   FREET4 1.2 04/21/2024   FREET4 0.80 02/02/2024     No results found for: TSI   No components found for: TRAB   No results found for: CHOL No results found for: HDL No results found for: LDLCALC No results found for: TRIG No results found for: [facility] Lab Results  Component Value Date   CREATININE 0.75 04/11/2024   Lab Results  Component Value Date   GFR 84.28 12/09/2023      Component Value Date/Time   NA 141 04/11/2024 1930   K 3.3 (L) 04/11/2024 1930   CL 105 04/11/2024 1930   CO2 25 04/11/2024 1930   GLUCOSE 73 04/11/2024 1930    BUN 11 04/11/2024 1930   CREATININE 0.75 04/11/2024 1930   CREATININE 0.83 01/29/2022 1131   CREATININE 0.94 01/04/2016 1628   CALCIUM 9.0 04/11/2024 1930   PROT 7.3 12/09/2023 1609   ALBUMIN 4.5 12/09/2023 1609   AST 17 12/09/2023 1609   AST 14 (L) 01/29/2022 1131   ALT 9 12/09/2023 1609   ALT 10 01/29/2022 1131   ALKPHOS 41 12/09/2023 1609   BILITOT 0.5 12/09/2023 1609   BILITOT 0.5 01/29/2022 1131   GFRNONAA >60 04/11/2024 1930   GFRNONAA >60 01/29/2022 1131      Latest Ref Rng & Units 04/11/2024    7:30 PM 12/09/2023    4:09 PM 12/01/2022   11:51 AM  BMP  Glucose 70 - 99 mg/dL 73  50  67   BUN 6 - 20 mg/dL 11  10  9    Creatinine 0.44 - 1.00 mg/dL 9.24  9.10  9.22   Sodium 135 - 145 mmol/L 141  140  137   Potassium 3.5 - 5.1 mmol/L 3.3  3.3  5.0   Chloride 98 - 111 mmol/L 105  103  110   CO2 22 - 32 mmol/L 25  30  15    Calcium 8.9 - 10.3 mg/dL 9.0  8.9  7.4        Component Value Date/Time   WBC 6.4 04/11/2024 1930   RBC 4.45 04/11/2024 1930   HGB 12.1 04/11/2024 1930   HGB 12.1 01/29/2022 1131   HGB 10.7 10/13/2017 0000   HCT 36.4 04/11/2024 1930   PLT 247 04/11/2024 1930   PLT 192 01/29/2022 1131   MCV 81.8 04/11/2024 1930   MCH 27.2 04/11/2024 1930   MCHC 33.2 04/11/2024 1930   RDW 13.1 04/11/2024 1930   LYMPHSABS 1.1 12/01/2022 1151   MONOABS 0.4 12/01/2022 1151   EOSABS 0.0 12/01/2022 1151   BASOSABS 0.0 12/01/2022 1151      Parts of this note may have been dictated using voice recognition software. There may be variances in spelling and vocabulary which are unintentional. Not all errors are proofread. Please notify the dino if any discrepancies are noted or if the meaning of any statement is not clear.

## 2024-04-27 ENCOUNTER — Ambulatory Visit: Admitting: Cardiology

## 2024-04-27 ENCOUNTER — Encounter: Payer: Self-pay | Admitting: Hematology and Oncology

## 2024-04-29 LAB — CBC
Hematocrit: 39 % (ref 34.0–46.6)
Hemoglobin: 12.5 g/dL (ref 11.1–15.9)
MCH: 27.4 pg (ref 26.6–33.0)
MCHC: 32.1 g/dL (ref 31.5–35.7)
MCV: 86 fL (ref 79–97)
Platelets: 222 x10E3/uL (ref 150–450)
RBC: 4.56 x10E6/uL (ref 3.77–5.28)
RDW: 12 % (ref 11.7–15.4)
WBC: 5.4 x10E3/uL (ref 3.4–10.8)

## 2024-04-29 LAB — BASIC METABOLIC PANEL WITH GFR
BUN/Creatinine Ratio: 12 (ref 9–23)
BUN: 12 mg/dL (ref 6–20)
CO2: 24 mmol/L (ref 20–29)
Calcium: 9.9 mg/dL (ref 8.7–10.2)
Chloride: 102 mmol/L (ref 96–106)
Creatinine, Ser: 1.03 mg/dL — ABNORMAL HIGH (ref 0.57–1.00)
Glucose: 82 mg/dL (ref 70–99)
Potassium: 4 mmol/L (ref 3.5–5.2)
Sodium: 141 mmol/L (ref 134–144)
eGFR: 73 mL/min/1.73 (ref >59)

## 2024-05-02 ENCOUNTER — Telehealth: Payer: Self-pay | Admitting: Internal Medicine

## 2024-05-02 NOTE — Telephone Encounter (Signed)
 Patient states that her insurance says her prior auth needs to be mark as urgent so that they can get it approve asap. Please advise

## 2024-05-03 ENCOUNTER — Ambulatory Visit (HOSPITAL_COMMUNITY): Admission: RE | Admit: 2024-05-03 | Discharge: 2024-05-03 | Attending: "Endocrinology

## 2024-05-03 DIAGNOSIS — E010 Iodine-deficiency related diffuse (endemic) goiter: Secondary | ICD-10-CM | POA: Insufficient documentation

## 2024-05-05 ENCOUNTER — Ambulatory Visit: Admitting: "Endocrinology

## 2024-05-05 ENCOUNTER — Ambulatory Visit: Payer: Self-pay | Admitting: "Endocrinology

## 2024-05-05 NOTE — Telephone Encounter (Signed)
 Pt calling for update as surgery is set for 12/19. Please advise.

## 2024-05-16 NOTE — Pre-Procedure Instructions (Signed)
 Instructed patient on the following items: Arrival time 1100 Nothing to eat or drink after midnight No meds AM of procedure Responsible person to drive you home and stay with you for 24 hrs  Last diltiazem - 12/27

## 2024-05-17 ENCOUNTER — Encounter (HOSPITAL_COMMUNITY)
Admission: RE | Disposition: A | Discharge status: Home / Self Care | Payer: Self-pay | Source: Home / Self Care | Attending: Internal Medicine

## 2024-05-17 ENCOUNTER — Ambulatory Visit (HOSPITAL_COMMUNITY)
Admission: RE | Admit: 2024-05-17 | Discharge: 2024-05-18 | Disposition: A | Discharge status: Home / Self Care | Attending: Internal Medicine | Admitting: Internal Medicine

## 2024-05-17 ENCOUNTER — Encounter (HOSPITAL_COMMUNITY): Payer: Self-pay | Admitting: Internal Medicine

## 2024-05-17 ENCOUNTER — Ambulatory Visit (HOSPITAL_COMMUNITY)

## 2024-05-17 ENCOUNTER — Other Ambulatory Visit: Payer: Self-pay

## 2024-05-17 DIAGNOSIS — Z01818 Encounter for other preprocedural examination: Secondary | ICD-10-CM

## 2024-05-17 DIAGNOSIS — Z87891 Personal history of nicotine dependence: Secondary | ICD-10-CM | POA: Diagnosis not present

## 2024-05-17 DIAGNOSIS — I4892 Unspecified atrial flutter: Secondary | ICD-10-CM | POA: Diagnosis not present

## 2024-05-17 DIAGNOSIS — I471 Supraventricular tachycardia, unspecified: Secondary | ICD-10-CM | POA: Insufficient documentation

## 2024-05-17 DIAGNOSIS — Z79899 Other long term (current) drug therapy: Secondary | ICD-10-CM | POA: Insufficient documentation

## 2024-05-17 HISTORY — PX: SVT ABLATION: EP1225

## 2024-05-17 LAB — PREGNANCY, URINE: Preg Test, Ur: NEGATIVE

## 2024-05-17 MED ORDER — SODIUM CHLORIDE 0.9% FLUSH: 3.0000 mL | INTRAVENOUS | Status: DC | PRN

## 2024-05-17 MED ORDER — HEPARIN (PORCINE) IN NACL 1000-0.9 UT/500ML-% IV SOLN
INTRAVENOUS | Status: DC | PRN
  Administered 2024-05-17: 500 mL

## 2024-05-17 MED ORDER — MIDAZOLAM HCL 5 MG/5ML IJ SOLN
INTRAMUSCULAR | Status: AC
  Filled 2024-05-17: qty 5

## 2024-05-17 MED ORDER — ISOPROTERENOL HCL 0.2 MG/ML IJ SOLN
INTRAMUSCULAR | Status: AC
  Filled 2024-05-17: qty 5

## 2024-05-17 MED ORDER — HEPARIN SODIUM (PORCINE) 1000 UNIT/ML IJ SOLN
INTRAMUSCULAR | Status: AC
  Filled 2024-05-17: qty 10

## 2024-05-17 MED ORDER — ACETAMINOPHEN 325 MG PO TABS: 650.0000 mg | ORAL_TABLET | ORAL | Status: DC | PRN

## 2024-05-17 MED ORDER — FENTANYL CITRATE (PF) 100 MCG/2ML IJ SOLN
INTRAMUSCULAR | Status: AC
  Filled 2024-05-17: qty 2

## 2024-05-17 MED ORDER — ONDANSETRON HCL 4 MG/2ML IJ SOLN: 4.0000 mg | Freq: Four times a day (QID) | INTRAMUSCULAR | Status: DC | PRN

## 2024-05-17 MED ORDER — SODIUM CHLORIDE 0.9% FLUSH
3.0000 mL | Freq: Two times a day (BID) | INTRAVENOUS | Status: DC
  Administered 2024-05-17 – 2024-05-18 (×2): 3 mL via INTRAVENOUS

## 2024-05-17 MED ORDER — ISOPROTERENOL HCL 0.2 MG/ML IJ SOLN
INTRAVENOUS | Status: DC | PRN
  Administered 2024-05-17: 4 ug/min via INTRAVENOUS

## 2024-05-17 MED ORDER — FENTANYL CITRATE (PF) 100 MCG/2ML IJ SOLN
INTRAMUSCULAR | Status: DC | PRN
  Administered 2024-05-17: 12.5 ug via INTRAVENOUS
  Administered 2024-05-17 (×2): 25 ug via INTRAVENOUS
  Administered 2024-05-17 (×4): 12.5 ug via INTRAVENOUS

## 2024-05-17 MED ORDER — HEPARIN SODIUM (PORCINE) 1000 UNIT/ML IJ SOLN
INTRAMUSCULAR | Status: DC | PRN
  Administered 2024-05-17: 1000 [IU] via INTRAVENOUS

## 2024-05-17 MED ORDER — SODIUM CHLORIDE 0.9 % IV SOLN: INTRAVENOUS | Status: DC

## 2024-05-17 MED ORDER — SODIUM CHLORIDE 0.9 % IV SOLN: 250.0000 mL | INTRAVENOUS | Status: DC | PRN

## 2024-05-17 MED ORDER — MIDAZOLAM HCL 5 MG/5ML IJ SOLN
INTRAMUSCULAR | Status: DC | PRN
  Administered 2024-05-17 (×2): 2 mg via INTRAVENOUS
  Administered 2024-05-17 (×5): 1 mg via INTRAVENOUS

## 2024-05-17 NOTE — Progress Notes (Signed)
 Sheath pulled at 1625 by [facility] Vital signs checked and stable before pull. Sheaths were aspirated and removed, pressure was held. Vitals were checked every 5 minutes. Site cover with Tegaderm and Rolena. No hematoma and site is level zero. Distal pulse palpable. Vitals and sign stable post pull. Education given to pt and bedrest started at 1640.

## 2024-05-17 NOTE — Plan of Care (Signed)
  Problem: Education: Goal: Knowledge of General Education information will improve Description: Including pain rating scale, medication(s)/side effects and non-pharmacologic comfort measures Outcome: Progressing   Problem: Health Behavior/Discharge Planning: Goal: Ability to manage health-related needs will improve Outcome: Progressing   Problem: Clinical Measurements: Goal: Ability to maintain clinical measurements within normal limits will improve Outcome: Progressing Goal: Will remain free from infection Outcome: Progressing Goal: Diagnostic test results will improve Outcome: Progressing Goal: Respiratory complications will improve Outcome: Progressing Goal: Cardiovascular complication will be avoided Outcome: Progressing   Problem: Activity: Goal: Risk for activity intolerance will decrease Outcome: Progressing   Problem: Nutrition: Goal: Adequate nutrition will be maintained Outcome: Progressing   Problem: Coping: Goal: Level of anxiety will decrease Outcome: Progressing   Problem: Elimination: Goal: Will not experience complications related to bowel motility Outcome: Progressing Goal: Will not experience complications related to urinary retention Outcome: Progressing   Problem: Pain Managment: Goal: General experience of comfort will improve and/or be controlled Outcome: Progressing   Problem: Safety: Goal: Ability to remain free from injury will improve Outcome: Progressing   Problem: Skin Integrity: Goal: Risk for impaired skin integrity will decrease Outcome: Progressing   Problem: Education: Goal: Understanding of disease, treatment, and recovery process will improve Outcome: Progressing   Problem: Activity: Goal: Ability to return to baseline activity level will improve Outcome: Progressing   Problem: Cardiac: Goal: Ability to maintain adequate cardiovascular perfusion will improve Outcome: Progressing Goal: Vascular access site(s) Level 0-1  will be maintained Outcome: Progressing   Problem: Health Behavior/ Discharge Planning: Goal: Ability to safely manage health related needs after discharge Outcome: Progressing

## 2024-05-17 NOTE — Discharge Instructions (Signed)

## 2024-05-17 NOTE — H&P (Signed)
 "         HPI [name] is referred by Dr. Michele for evaluation of SVT. She is a pleasant 33 yo woman with a h/o SVT. She had twins born just over 2 years ago. She has documented near syncope and SVT at over 200/min. She notes that the cardizem  has had no effect on her symptoms. Her SVT has been treated in the past with IV adenosine. Her symptoms have been present over a year. 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  Allergies      Allergies  Allergen Reactions   Latex Hives                Current Outpatient Medications  Medication Sig Dispense Refill   buPROPion  (WELLBUTRIN  XL) 150 MG 24 hr tablet Take 1 tablet (150 mg total) by mouth daily. 30 tablet 0   diltiazem  (CARDIZEM  CD) 120 MG 24 hr capsule Take 1 capsule (120 mg total) by mouth daily. 90 capsule 3      No current facility-administered medications for this visit.              Past Medical History:  Diagnosis Date   Anemia     Anxiety     Chronic back pain     Depression     PCOS (polycystic ovarian syndrome)            ROS:    All systems reviewed and negative except as noted in the HPI.          Past Surgical History:  Procedure Laterality Date   CESAREAN SECTION MULTI-GESTATIONAL N/A 10/25/2021    Procedure: CESAREAN SECTION MULTI-GESTATIONAL;  Surgeon: Storm Setter, DO;  Location: [facility] LD ORS;  Service: Obstetrics;  Laterality: N/A;  Request OB Fellow   COLPOSCOPY       ECTOPIC PREGNANCY SURGERY   04/09/2016   WISDOM TOOTH EXTRACTION                     Family History  Problem Relation Age of Onset   Kidney disease Maternal Aunt     Breast cancer Maternal Aunt     Hypertension Maternal Grandmother     Hyperlipidemia Maternal Grandmother     Arthritis Maternal Grandmother     Diabetes Maternal Grandfather     Hypertension Maternal Grandfather              Social History         Socioeconomic History   Marital status: Significant Other      Spouse name: Not on file    Number of children: Not on file   Years of education: Not on file   Highest education level: Not on file  Occupational History   Not on file  Tobacco Use   Smoking status: Former      Current packs/day: 0.00      Types: Cigarettes      Quit date: 10/24/2020      Years since quitting: 3.3   Smokeless tobacco: Never  Vaping Use   Vaping status: Former   Quit date: 10/24/2020  Substance and Sexual Activity   Alcohol use: Not Currently      Comment: not while preg   Drug use: No   Sexual activity: Yes      Birth control/protection: None  Other Topics Concern   Not on file  Social History Narrative    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 social work  Engaged    Social Drivers of Manufacturing Engineer Strain: Not on file  Food Insecurity: Not on file  Transportation Needs: Not on file  Physical Activity: Not on file  Stress: Not on file  Social Connections: Not on file  Intimate Partner Violence: Not on file        BP 104/68   Pulse 91   Ht 5' 5 (1.651 m)   Wt 107 lb (48.5 kg)   LMP 02/22/2024   SpO2 97%   BMI 17.81 kg/m    Physical Exam:   Well appearing 33 yo woman, NAD HEENT: Unremarkable Neck:  No JVD, no thyromegally Lymphatics:  No adenopathy Back:  No CVA tenderness Lungs:  Clear with no wheezes HEART:  Regular rate rhythm, no murmurs, no rubs, no clicks Abd:  soft, positive bowel sounds, no organomegally, no rebound, no guarding Ext:  2 plus pulses, no edema, no cyanosis, no clubbing Skin:  No rashes no nodules Neuro:  CN II through XII intact, motor grossly intact   EKG - reviewed from a month ago. NSR with no pre-excitation.   Assess/Plan: SVT - I have discussed the treatment options with the patient and the risks/benefits/goals/expectations of EP study and catheter ablation were reviewed. We have tentatively scheduled the patient to proceed.  Thyroid  dysfunction - Her FT4 and T3 are normal but her TSH is low. She has lost weight. She is pending endocrine eval.     Danelle Earnstine Meinders,MD "

## 2024-05-18 ENCOUNTER — Encounter (HOSPITAL_COMMUNITY): Payer: Self-pay | Admitting: Internal Medicine

## 2024-05-18 DIAGNOSIS — I471 Supraventricular tachycardia, unspecified: Secondary | ICD-10-CM | POA: Diagnosis not present

## 2024-05-18 NOTE — TOC Transition Note (Signed)
 Transition of Care [facility]) - Discharge Note   Patient Details  Name: Nancy Hunter MRN: [id number] Date of Birth: 03-11-1991  Transition of Care [facility]) CM/SW Contact:  Roxie KANDICE Stain, RN Phone Number: 05/18/2024, 11:53 AM   Clinical Narrative:     Nancy Hunter is stable to discharge home. Follow up apt on AVS. No ICM (Inpatient Care Management) needs at this time.   Final next level of care: Home/Self Care Barriers to Discharge: Barriers Resolved   Patient Goals and CMS Choice Patient states their goals for this hospitalization and ongoing recovery are:: return home          Discharge Placement               home        Discharge Plan and Services Additional resources added to the After Visit Summary for                                       Social Drivers of Health (SDOH) Interventions SDOH Screenings   Food Insecurity: No Food Insecurity (05/17/2024)  Housing: Low Risk (05/17/2024)  Transportation Needs: No Transportation Needs (05/17/2024)  Utilities: Not At Risk (05/17/2024)  Depression (PHQ2-9): Low Risk (04/20/2024)  Tobacco Use: Medium Risk (05/17/2024)     Readmission Risk Interventions     No data to display

## 2024-05-18 NOTE — TOC CM/SW Note (Signed)
 Transition of Care [facility]) - Inpatient Brief Assessment   Patient Details  Name: Nancy Hunter MRN: [id number] Date of Birth: [birth year]  Transition of Care [facility]) CM/SW Contact:    Lauraine FORBES Saa, LCSWA Phone Number: 05/18/2024, 9:25 AM   Clinical Narrative:  9:25 AM Per chart review, patient resides at home with significant other. Patient has a PCP and insurance on file. Patient does not have SNF/HH/DME history on chart. Patient's listed preferred pharmacy is [facility] [phone] [facility]. No TOC needs identified at this time. TOC will continue to follow.    Transition of Care Asessment: Insurance and Status: Insurance coverage has been reviewed Patient has primary care physician: Yes Home environment has been reviewed: Private Residence Prior level of function:: N/A Prior/Current Home Services: No current home services Social Drivers of Health Review: SDOH reviewed no interventions necessary Readmission risk has been reviewed: Yes (Currently Outpatient in Bed Status) Transition of care needs: no transition of care needs at this time

## 2024-05-18 NOTE — Discharge Summary (Addendum)
 "    ELECTROPHYSIOLOGY PROCEDURE DISCHARGE SUMMARY    Patient ID: Nancy Hunter,  MRN: [id number], DOB/AGE: 1990/09/24 33 y.o.  Admit date: 05/17/2024 Discharge date: 05/18/2024  Primary Care Physician: Merna Huxley, NP  Primary Cardiologist: Dr. Michele Electrophysiologist: Dr. Waddell   Primary Discharge Diagnosis:  SVT     Secondary Discharge Diagnosis:  anxiety  Procedures This Admission:  1.  Electrophysiology study by Dr Waddell   This study demonstrated  Conclusion: Unsuccessful catheter ablation of the patient's SVT. The patient's clinical tachycardia could not be induced. The patient had inducible atrial flutter with a mostly 2-1 AV conduction at a rate of 140 bpm. This tachycardia could not be ablated secondary to its inability to be reproducibly induced. The tachycardia was induced initially for several minutes but afterwards could no longer be induced with rapid atrial pacing on isoproterenol . Because of the lack of sustained inducibility, the tachycardia could not be successfully mapped due to the constraints above    Brief HPI: Nancy Hunter is a 33 y.o. female with a history of SVT that is highly symptomatic, and has been adenosine sensitive, referred to EP, saw Dr. Waddell and planned for [facility] Course:  The patient was admitted and underwent EPS, only an AFlutter (not suspect to be her clinical arrhythmia) was induced, though broke, and no further sustained arrhythmias were able to be induced. She was monitored on telemetry overnight which demonstrated SR.  Groin was without complication on the day of discharge.  The patient feels well today, denies CP, SOB, site discomfort.  She was examined by Dr. Waddell and considered to be stable for discharge.  BPs, generally lower side, without symptoms, in d/w Dr. Waddell, to resume home diltiazem  this evening at home, that has been well tolerated..  Wound care and restrictions were reviewed with the patient.     Dr. Waddell discussed: If recurrent/problematic SVT > alternative  medications could be tried, if significant burden could be brought back to the lab She is aware that Dr.Claudie Brickhouse is retiring, and will be assigned a new EP MD She has EP APP follow up in place  She has already been written off work until 05/24/24  I have advised that she start to keep a symptom diary to see if any trigger/pattern to her SVT can be elucidated   Physical Exam: Vitals:   05/17/24 1959 05/18/24 0000 05/18/24 0437 05/18/24 0751  BP: (!) 88/55 101/62 (!) 87/51 (!) 90/56  Pulse: 69 75 65 60  Resp:  19 15 16   Temp: 97.7 F (36.5 C) 98.3 F (36.8 C) 98 F (36.7 C) 98.2 F (36.8 C)  TempSrc: Oral Oral Oral Oral  SpO2: 100% 100% 98% 98%  Weight:      Height:        GEN- The patient is well appearing, alert and oriented x 3 today.   HEENT: normocephalic, atraumatic; sclera clear, conjunctiva pink; hearing intact; oropharynx clear; neck supple  Lungs- CTA b/l, normal work of breathing.  No wheezes, rales, rhonchi Heart- RRR, no murmurs, rubs or gallops  GI- soft, non-tender, non-distended, bowel sounds present  Extremities- no clubbing, cyanosis, or edema; DP/PT/radial pulses 2+ bilaterally, groin without hematoma/bruit MS- no significant deformity or atrophy Skin- warm and dry, no rash or lesion Psych- euthymic mood, full affect Neuro- strength and sensation are intact   Labs:   Lab Results  Component Value Date   WBC 5.4 04/29/2024   HGB 12.5 04/29/2024   HCT 39.0  04/29/2024   MCV 86 04/29/2024   PLT 222 04/29/2024   No results for input(s): NA, K, CL, CO2, BUN, CREATININE, CALCIUM, PROT, BILITOT, ALKPHOS, ALT, AST, GLUCOSE in the last 168 hours.  Invalid input(s): LABALBU   Discharge Medications:  Allergies as of 05/18/2024       Reactions   Latex Hives        Medication List     TAKE these medications    buPROPion  150 MG 24 hr tablet Commonly known  as: Wellbutrin  XL Take 1 tablet (150 mg total) by mouth daily.   diltiazem  120 MG 24 hr capsule Commonly known as: Cardizem  CD Take 1 capsule (120 mg total) by mouth 2 (two) times daily. Notes to patient: Resume 05/18/25 evening dose   HAIR SKIN & NAILS PO Take 1 tablet by mouth every evening.   Omron 3 Series BP Monitor Devi Measure blood pressure as directed.        Disposition: home Discharge Instructions     Increase activity slowly   Complete by: As directed         Duration of Discharge Encounter: 13 minutes, APP time.  Bonney Charlies Arthur, PA-C 05/18/2024 10:45 AM  EP Attending  Patient seen and examined. Agree with the findings as above. The patient is doing well after EP study. Her groin demonstrates no hematoma and tele with NSR. Ok for DC home. Limitations for activity given. Followup in several weeks.   Danelle Sebastyan Snodgrass,MD    "

## 2024-05-18 NOTE — Plan of Care (Signed)
  Problem: Education: Goal: Knowledge of General Education information will improve Description: Including pain rating scale, medication(s)/side effects and non-pharmacologic comfort measures Outcome: Adequate for Discharge   Problem: Health Behavior/Discharge Planning: Goal: Ability to manage health-related needs will improve Outcome: Adequate for Discharge   Problem: Clinical Measurements: Goal: Ability to maintain clinical measurements within normal limits will improve Outcome: Adequate for Discharge Goal: Will remain free from infection Outcome: Adequate for Discharge Goal: Diagnostic test results will improve Outcome: Adequate for Discharge Goal: Respiratory complications will improve Outcome: Adequate for Discharge Goal: Cardiovascular complication will be avoided Outcome: Adequate for Discharge   Problem: Activity: Goal: Risk for activity intolerance will decrease Outcome: Adequate for Discharge   Problem: Nutrition: Goal: Adequate nutrition will be maintained Outcome: Adequate for Discharge   Problem: Coping: Goal: Level of anxiety will decrease Outcome: Adequate for Discharge   Problem: Elimination: Goal: Will not experience complications related to bowel motility Outcome: Adequate for Discharge Goal: Will not experience complications related to urinary retention Outcome: Adequate for Discharge   Problem: Pain Managment: Goal: General experience of comfort will improve and/or be controlled Outcome: Adequate for Discharge   Problem: Safety: Goal: Ability to remain free from injury will improve Outcome: Adequate for Discharge   Problem: Skin Integrity: Goal: Risk for impaired skin integrity will decrease Outcome: Adequate for Discharge   Problem: Education: Goal: Understanding of disease, treatment, and recovery process will improve Outcome: Adequate for Discharge   Problem: Activity: Goal: Ability to return to baseline activity level will  improve Outcome: Adequate for Discharge   Problem: Cardiac: Goal: Ability to maintain adequate cardiovascular perfusion will improve Outcome: Adequate for Discharge Goal: Vascular access site(s) Level 0-1 will be maintained Outcome: Adequate for Discharge   Problem: Health Behavior/ Discharge Planning: Goal: Ability to safely manage health related needs after discharge Outcome: Adequate for Discharge

## 2024-05-23 ENCOUNTER — Telehealth: Payer: Self-pay | Admitting: Internal Medicine

## 2024-05-23 NOTE — Telephone Encounter (Signed)
 Patient c/o Palpitations:  STAT if patient reporting lightheadedness, shortness of breath, or chest pain  How long have you had palpitations/irregular HR/ Afib? Are you having the symptoms now?   Yes  Are you currently experiencing lightheadedness, SOB or CP?   A bit of SOB, mild chest pain when she is in motion  Do you have a history of afib (atrial fibrillation) or irregular heart rhythm?   Yes  Have you checked your BP or HR? (document readings if available):   This morning - 89/62  HR 66  Are you experiencing any other symptoms? Slight headache earlier  Patient is concerned she has been having palpitation since yesterday.

## 2024-05-23 NOTE — Telephone Encounter (Signed)
 Spoke with pt and she stated that she is having palpitations again and slight shortness of breath. Pt states that her SVT ablation was unsuccessful due to HR issues. BP 120/83 HR 71. Pt took her scheduled diltiazem  120 mg this AM. Per Dr. Adrian DC note from 12/31, if SVT is still problematic, alternative medications can be tried. Will send to Jodie (f/u scheduled for 1/30) for further recommendations. 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

## 2024-05-24 MED ORDER — DILTIAZEM HCL ER COATED BEADS 120 MG PO CP24: 120.0000 mg | ORAL_CAPSULE | Freq: Every day | ORAL | 3 refills | Status: AC

## 2024-05-24 MED ORDER — FLECAINIDE ACETATE 50 MG PO TABS: 50.0000 mg | ORAL_TABLET | Freq: Two times a day (BID) | ORAL | 3 refills | Status: AC

## 2024-05-24 NOTE — Telephone Encounter (Signed)
 Followed up with patient, spent over 20 minutes on phone discussing/explaining/educating. Pt agreeable to starting Flecainide  tonight and decreasing Diltiazem  to once daily -- advised her to take it a bedtime.  Scheduled for EP RN visit next Wednesday.   Pt is asking for another couple days out of work so that the new medication can be started and maybe improve how she is feeling.  Will froward to provider for approval/disapproval.  Pt aware I will let her know as soon as provider responds (she is supposed to return to work tomorrow)

## 2024-05-25 NOTE — Telephone Encounter (Signed)
 Late entry -  spoke to pt yesterday evening and made aware one of the provider, Jodie, was not comfortable giving her anymore time away from work since ablation was performed. Aware we are still awaiting MD advisement, but to plan on returning to work or discussing more time off w/ PCP until hearing back from MD.

## 2024-06-01 ENCOUNTER — Ambulatory Visit

## 2024-06-01 VITALS — BP 104/66 | HR 57 | Ht 65.0 in | Wt 113.6 lb

## 2024-06-01 DIAGNOSIS — R002 Palpitations: Secondary | ICD-10-CM

## 2024-06-01 NOTE — Progress Notes (Signed)
" ° °  Nurse Visit   Date of Encounter: 06/01/2024 ID: Nancy Hunter, DOB [birth year], MRN [id number]  PCP:  Merna Huxley, NP   [facility] Providers Cardiologist:  Madonna Large, DO Electrophysiologist:  Danelle Birmingham, MD      Visit Details   VS:  BP 104/66   Pulse (!) 57   Ht 5' 5 (1.651 m)   Wt 113 lb 9.6 oz (51.5 kg)   SpO2 100%   BMI 18.90 kg/m  , BMI Body mass index is 18.9 kg/m.  Wt Readings from Last 3 Encounters:  06/01/24 113 lb 9.6 oz (51.5 kg)  05/17/24 105 lb (47.6 kg)  04/25/24 105 lb (47.6 kg)     Reason for visit: ECG for Flecainide  start per Dr Birmingham Performed today: Vital signs, ECG and consulted with Dr Inocencio CHESHIRE Changes (medications, testing, etc.) : None Length of Visit: 20 minutes    Medications Adjustments/Labs and Tests Ordered: Orders Placed This Encounter  Procedures   EKG 12-Lead   No orders of the defined types were placed in this encounter. Continue Flecainide  50mg  as prescribed and keep follow up appointment as scheduled.   Signed, Aldona FORBES Marina, RN  06/01/2024 9:07 AM  "

## 2024-06-01 NOTE — Patient Instructions (Signed)
 Medication Instructions:  Your physician recommends that you continue on your current medications as directed. Please refer to the Current Medication list given to you today.  *If you need a refill on your cardiac medications before your next appointment, please call your pharmacy*  Lab Work: None ordered.  If you have labs (blood work) drawn today and your tests are completely normal, you will receive your results only by: MyChart Message (if you have MyChart) OR A paper copy in the mail If you have any lab test that is abnormal or we need to change your treatment, we will call you to review the results.  Testing/Procedures: None ordered.   Follow-Up: At [facility], you and your health needs are our priority.  As part of our continuing mission to provide you with exceptional heart care, our providers are all part of one team.  This team includes your primary Cardiologist (physician) and Advanced Practice Providers or APPs (Physician Assistants and Nurse Practitioners) who all work together to provide you with the care you need, when you need it.  Your next appointment:   Keep appointment as scheduled

## 2024-06-06 ENCOUNTER — Ambulatory Visit: Admitting: Pulmonary Disease

## 2024-06-17 ENCOUNTER — Ambulatory Visit: Attending: Student | Admitting: Medical

## 2024-06-17 VITALS — BP 104/74 | HR 58 | Ht 65.0 in | Wt 116.0 lb

## 2024-06-17 DIAGNOSIS — D508 Other iron deficiency anemias: Secondary | ICD-10-CM

## 2024-06-17 DIAGNOSIS — I471 Supraventricular tachycardia, unspecified: Secondary | ICD-10-CM

## 2024-06-17 NOTE — Patient Instructions (Signed)
 Medication Instructions:  Your physician recommends that you continue on your current medications as directed. Please refer to the Current Medication list given to you today.  *If you need a refill on your cardiac medications before your next appointment, please call your pharmacy*  Lab Work: CBC, CMET, TSH, FreeT4, Ferritin-TODAY If you have labs (blood work) drawn today and your tests are completely normal, you will receive your results only by: MyChart Message (if you have MyChart) OR A paper copy in the mail If you have any lab test that is abnormal or we need to change your treatment, we will call you to review the results.  Follow-Up: At [facility], you and your health needs are our priority.  As part of our continuing mission to provide you with exceptional heart care, our providers are all part of one team.  This team includes your primary Cardiologist (physician) and Advanced Practice Providers or APPs (Physician Assistants and Nurse Practitioners) who all work together to provide you with the care you need, when you need it.  Your next appointment:   6 month(s)  Provider:   Odis Phoenix, PA-C

## 2024-06-18 LAB — CBC WITH DIFFERENTIAL/PLATELET
Basophils Absolute: 0 10*3/uL (ref 0.0–0.2)
Basos: 1 %
EOS (ABSOLUTE): 0.1 10*3/uL (ref 0.0–0.4)
Eos: 2 %
Hematocrit: 37.6 % (ref 34.0–46.6)
Hemoglobin: 12 g/dL (ref 11.1–15.9)
Immature Grans (Abs): 0 10*3/uL (ref 0.0–0.1)
Immature Granulocytes: 0 %
Lymphocytes Absolute: 1.9 10*3/uL (ref 0.7–3.1)
Lymphs: 33 %
MCH: 27.6 pg (ref 26.6–33.0)
MCHC: 31.9 g/dL (ref 31.5–35.7)
MCV: 87 fL (ref 79–97)
Monocytes Absolute: 0.4 10*3/uL (ref 0.1–0.9)
Monocytes: 6 %
Neutrophils Absolute: 3.4 10*3/uL (ref 1.4–7.0)
Neutrophils: 58 %
Platelets: 214 10*3/uL (ref 150–450)
RBC: 4.34 x10E6/uL (ref 3.77–5.28)
RDW: 13.1 % (ref 11.7–15.4)
WBC: 5.8 10*3/uL (ref 3.4–10.8)

## 2024-06-18 LAB — TSH+FREE T4
Free T4: 1.08 ng/dL (ref 0.82–1.77)
TSH: 0.887 u[IU]/mL (ref 0.450–4.500)

## 2024-06-18 LAB — FERRITIN: Ferritin: 63 ng/mL (ref 15–150)

## 2024-06-20 ENCOUNTER — Ambulatory Visit: Payer: Self-pay | Admitting: Medical

## 2024-06-20 ENCOUNTER — Other Ambulatory Visit (HOSPITAL_COMMUNITY): Payer: Self-pay

## 2024-06-20 DIAGNOSIS — I951 Orthostatic hypotension: Secondary | ICD-10-CM

## 2024-06-20 MED ORDER — MIDODRINE HCL 5 MG PO TABS
5.0000 mg | ORAL_TABLET | Freq: Three times a day (TID) | ORAL | 3 refills | Status: AC | PRN
  Filled 2024-06-20: qty 90, 30d supply, fill #0

## 2024-06-20 NOTE — Progress Notes (Signed)
 Labs look good and the ferritin levels are above the recommended level for folks with what we can Inappropriate Sinus tachycardia.  As discussed, I will send in a prescription for midodrine  to see if it helps with the dizziness with positional change.  Please teach her about using the midodrine  and have her come back with me in ~ 2-3 months. thanks

## 2024-06-22 ENCOUNTER — Ambulatory Visit: Admitting: Adult Health

## 2024-06-24 NOTE — Telephone Encounter (Signed)
 Pt made aware to follow up in 2-3 months with Odis Phoenix, PA and that office would contact her to arrange this appt. Pt agreeable to plan.
